# Patient Record
Sex: Female | Born: 1945 | Race: White | Hispanic: No | Smoking: Former smoker
Health system: Southern US, Community
[De-identification: ages and names within clinical notes are randomized; demographics above are authoritative.]

## PROBLEM LIST (undated history)

## (undated) DIAGNOSIS — R4189 Other symptoms and signs involving cognitive functions and awareness: Secondary | ICD-10-CM

## (undated) DIAGNOSIS — G9340 Encephalopathy, unspecified: Secondary | ICD-10-CM

## (undated) DIAGNOSIS — I10 Essential (primary) hypertension: Secondary | ICD-10-CM

## (undated) HISTORY — PX: ABDOMINAL HYSTERECTOMY: SHX81

---

## 2013-01-01 ENCOUNTER — Other Ambulatory Visit: Payer: Self-pay | Admitting: Family Medicine

## 2013-01-01 ENCOUNTER — Ambulatory Visit
Admission: RE | Admit: 2013-01-01 | Discharge: 2013-01-01 | Disposition: A | Discharge status: Home / Self Care | Payer: Medicare Other | Source: Ambulatory Visit | Attending: Family Medicine | Admitting: Family Medicine

## 2013-01-01 DIAGNOSIS — R52 Pain, unspecified: Secondary | ICD-10-CM

## 2013-01-01 DIAGNOSIS — M79609 Pain in unspecified limb: Secondary | ICD-10-CM | POA: Diagnosis not present

## 2013-01-01 DIAGNOSIS — T148XXA Other injury of unspecified body region, initial encounter: Secondary | ICD-10-CM | POA: Diagnosis not present

## 2013-01-01 DIAGNOSIS — S8990XA Unspecified injury of unspecified lower leg, initial encounter: Secondary | ICD-10-CM | POA: Diagnosis not present

## 2015-01-06 DIAGNOSIS — Z136 Encounter for screening for cardiovascular disorders: Secondary | ICD-10-CM | POA: Diagnosis not present

## 2015-01-06 DIAGNOSIS — F17200 Nicotine dependence, unspecified, uncomplicated: Secondary | ICD-10-CM | POA: Diagnosis not present

## 2015-01-06 DIAGNOSIS — F4320 Adjustment disorder, unspecified: Secondary | ICD-10-CM | POA: Diagnosis not present

## 2015-01-06 DIAGNOSIS — Z79899 Other long term (current) drug therapy: Secondary | ICD-10-CM | POA: Diagnosis not present

## 2015-01-06 DIAGNOSIS — I10 Essential (primary) hypertension: Secondary | ICD-10-CM | POA: Diagnosis not present

## 2015-08-16 DIAGNOSIS — L989 Disorder of the skin and subcutaneous tissue, unspecified: Secondary | ICD-10-CM | POA: Diagnosis not present

## 2015-08-16 DIAGNOSIS — I10 Essential (primary) hypertension: Secondary | ICD-10-CM | POA: Diagnosis not present

## 2015-08-25 DIAGNOSIS — L820 Inflamed seborrheic keratosis: Secondary | ICD-10-CM | POA: Diagnosis not present

## 2015-08-25 DIAGNOSIS — L814 Other melanin hyperpigmentation: Secondary | ICD-10-CM | POA: Diagnosis not present

## 2015-08-25 DIAGNOSIS — L570 Actinic keratosis: Secondary | ICD-10-CM | POA: Diagnosis not present

## 2015-08-25 DIAGNOSIS — L821 Other seborrheic keratosis: Secondary | ICD-10-CM | POA: Diagnosis not present

## 2015-10-06 DIAGNOSIS — L570 Actinic keratosis: Secondary | ICD-10-CM | POA: Diagnosis not present

## 2015-10-06 DIAGNOSIS — D0461 Carcinoma in situ of skin of right upper limb, including shoulder: Secondary | ICD-10-CM | POA: Diagnosis not present

## 2015-10-06 DIAGNOSIS — L820 Inflamed seborrheic keratosis: Secondary | ICD-10-CM | POA: Diagnosis not present

## 2015-10-06 DIAGNOSIS — D485 Neoplasm of uncertain behavior of skin: Secondary | ICD-10-CM | POA: Diagnosis not present

## 2015-10-26 DIAGNOSIS — S63635A Sprain of interphalangeal joint of left ring finger, initial encounter: Secondary | ICD-10-CM | POA: Diagnosis not present

## 2015-11-01 DIAGNOSIS — S6992XA Unspecified injury of left wrist, hand and finger(s), initial encounter: Secondary | ICD-10-CM | POA: Diagnosis not present

## 2015-11-01 DIAGNOSIS — S62625A Displaced fracture of medial phalanx of left ring finger, initial encounter for closed fracture: Secondary | ICD-10-CM | POA: Diagnosis not present

## 2015-11-04 DIAGNOSIS — D0461 Carcinoma in situ of skin of right upper limb, including shoulder: Secondary | ICD-10-CM | POA: Diagnosis not present

## 2015-12-01 DIAGNOSIS — S62625D Displaced fracture of medial phalanx of left ring finger, subsequent encounter for fracture with routine healing: Secondary | ICD-10-CM | POA: Diagnosis not present

## 2015-12-01 DIAGNOSIS — S62625A Displaced fracture of medial phalanx of left ring finger, initial encounter for closed fracture: Secondary | ICD-10-CM | POA: Insufficient documentation

## 2016-01-12 DIAGNOSIS — S62625D Displaced fracture of medial phalanx of left ring finger, subsequent encounter for fracture with routine healing: Secondary | ICD-10-CM | POA: Diagnosis not present

## 2016-02-07 DIAGNOSIS — M25642 Stiffness of left hand, not elsewhere classified: Secondary | ICD-10-CM | POA: Diagnosis not present

## 2016-02-28 DIAGNOSIS — S62625D Displaced fracture of medial phalanx of left ring finger, subsequent encounter for fracture with routine healing: Secondary | ICD-10-CM | POA: Diagnosis not present

## 2016-02-28 DIAGNOSIS — M25642 Stiffness of left hand, not elsewhere classified: Secondary | ICD-10-CM | POA: Diagnosis not present

## 2016-03-07 DIAGNOSIS — S62625D Displaced fracture of medial phalanx of left ring finger, subsequent encounter for fracture with routine healing: Secondary | ICD-10-CM | POA: Diagnosis not present

## 2016-03-07 DIAGNOSIS — R531 Weakness: Secondary | ICD-10-CM | POA: Diagnosis not present

## 2016-03-07 DIAGNOSIS — M25642 Stiffness of left hand, not elsewhere classified: Secondary | ICD-10-CM | POA: Diagnosis not present

## 2016-03-08 DIAGNOSIS — S62625D Displaced fracture of medial phalanx of left ring finger, subsequent encounter for fracture with routine healing: Secondary | ICD-10-CM | POA: Diagnosis not present

## 2017-07-31 DIAGNOSIS — Z23 Encounter for immunization: Secondary | ICD-10-CM | POA: Diagnosis not present

## 2017-07-31 DIAGNOSIS — F320 Major depressive disorder, single episode, mild: Secondary | ICD-10-CM | POA: Diagnosis not present

## 2017-07-31 DIAGNOSIS — I10 Essential (primary) hypertension: Secondary | ICD-10-CM | POA: Diagnosis not present

## 2017-07-31 DIAGNOSIS — F17200 Nicotine dependence, unspecified, uncomplicated: Secondary | ICD-10-CM | POA: Diagnosis not present

## 2017-08-27 ENCOUNTER — Encounter: Payer: Self-pay | Admitting: *Deleted

## 2018-05-20 ENCOUNTER — Emergency Department (HOSPITAL_COMMUNITY): Payer: Medicare Other

## 2018-05-20 ENCOUNTER — Other Ambulatory Visit: Payer: Self-pay

## 2018-05-20 ENCOUNTER — Inpatient Hospital Stay (HOSPITAL_COMMUNITY)
Admission: EM | Admit: 2018-05-20 | Discharge: 2018-06-01 | DRG: 070 | Disposition: A | Discharge status: Skilled Nursing Facility | Payer: Medicare Other | Attending: Internal Medicine | Admitting: Internal Medicine

## 2018-05-20 ENCOUNTER — Encounter (HOSPITAL_COMMUNITY): Payer: Self-pay | Admitting: Emergency Medicine

## 2018-05-20 DIAGNOSIS — E43 Unspecified severe protein-calorie malnutrition: Secondary | ICD-10-CM | POA: Diagnosis present

## 2018-05-20 DIAGNOSIS — N179 Acute kidney failure, unspecified: Secondary | ICD-10-CM | POA: Diagnosis not present

## 2018-05-20 DIAGNOSIS — R4020 Unspecified coma: Secondary | ICD-10-CM | POA: Diagnosis not present

## 2018-05-20 DIAGNOSIS — R0902 Hypoxemia: Secondary | ICD-10-CM | POA: Diagnosis not present

## 2018-05-20 DIAGNOSIS — E872 Acidosis, unspecified: Secondary | ICD-10-CM | POA: Diagnosis present

## 2018-05-20 DIAGNOSIS — R404 Transient alteration of awareness: Secondary | ICD-10-CM | POA: Diagnosis not present

## 2018-05-20 DIAGNOSIS — E860 Dehydration: Secondary | ICD-10-CM | POA: Diagnosis present

## 2018-05-20 DIAGNOSIS — G9340 Encephalopathy, unspecified: Secondary | ICD-10-CM

## 2018-05-20 DIAGNOSIS — L89151 Pressure ulcer of sacral region, stage 1: Secondary | ICD-10-CM | POA: Diagnosis present

## 2018-05-20 DIAGNOSIS — I10 Essential (primary) hypertension: Secondary | ICD-10-CM | POA: Diagnosis present

## 2018-05-20 DIAGNOSIS — R778 Other specified abnormalities of plasma proteins: Secondary | ICD-10-CM | POA: Diagnosis present

## 2018-05-20 DIAGNOSIS — Y900 Blood alcohol level of less than 20 mg/100 ml: Secondary | ICD-10-CM | POA: Diagnosis present

## 2018-05-20 DIAGNOSIS — L8990 Pressure ulcer of unspecified site, unspecified stage: Secondary | ICD-10-CM

## 2018-05-20 DIAGNOSIS — I248 Other forms of acute ischemic heart disease: Secondary | ICD-10-CM | POA: Diagnosis present

## 2018-05-20 DIAGNOSIS — Z79899 Other long term (current) drug therapy: Secondary | ICD-10-CM

## 2018-05-20 DIAGNOSIS — F1020 Alcohol dependence, uncomplicated: Secondary | ICD-10-CM | POA: Diagnosis present

## 2018-05-20 DIAGNOSIS — E876 Hypokalemia: Secondary | ICD-10-CM | POA: Diagnosis present

## 2018-05-20 DIAGNOSIS — Z87891 Personal history of nicotine dependence: Secondary | ICD-10-CM

## 2018-05-20 DIAGNOSIS — Z681 Body mass index (BMI) 19 or less, adult: Secondary | ICD-10-CM

## 2018-05-20 DIAGNOSIS — I499 Cardiac arrhythmia, unspecified: Secondary | ICD-10-CM | POA: Diagnosis not present

## 2018-05-20 DIAGNOSIS — L89322 Pressure ulcer of left buttock, stage 2: Secondary | ICD-10-CM | POA: Diagnosis present

## 2018-05-20 DIAGNOSIS — R7989 Other specified abnormal findings of blood chemistry: Secondary | ICD-10-CM | POA: Diagnosis not present

## 2018-05-20 DIAGNOSIS — D751 Secondary polycythemia: Secondary | ICD-10-CM | POA: Diagnosis present

## 2018-05-20 DIAGNOSIS — E870 Hyperosmolality and hypernatremia: Secondary | ICD-10-CM | POA: Diagnosis present

## 2018-05-20 DIAGNOSIS — G9341 Metabolic encephalopathy: Principal | ICD-10-CM | POA: Diagnosis present

## 2018-05-20 DIAGNOSIS — R569 Unspecified convulsions: Secondary | ICD-10-CM | POA: Diagnosis not present

## 2018-05-20 DIAGNOSIS — R32 Unspecified urinary incontinence: Secondary | ICD-10-CM | POA: Diagnosis present

## 2018-05-20 DIAGNOSIS — K5900 Constipation, unspecified: Secondary | ICD-10-CM

## 2018-05-20 DIAGNOSIS — R4182 Altered mental status, unspecified: Secondary | ICD-10-CM | POA: Diagnosis not present

## 2018-05-20 DIAGNOSIS — R52 Pain, unspecified: Secondary | ICD-10-CM | POA: Diagnosis not present

## 2018-05-20 DIAGNOSIS — R339 Retention of urine, unspecified: Secondary | ICD-10-CM | POA: Diagnosis not present

## 2018-05-20 HISTORY — DX: Essential (primary) hypertension: I10

## 2018-05-20 LAB — CBC WITH DIFFERENTIAL/PLATELET
Abs Immature Granulocytes: 0.05 10*3/uL (ref 0.00–0.07)
Basophils Absolute: 0 10*3/uL (ref 0.0–0.1)
Basophils Relative: 0 %
Eosinophils Absolute: 0 10*3/uL (ref 0.0–0.5)
Eosinophils Relative: 0 %
HCT: 56.2 % — ABNORMAL HIGH (ref 36.0–46.0)
Hemoglobin: 18.2 g/dL — ABNORMAL HIGH (ref 12.0–15.0)
Immature Granulocytes: 1 %
Lymphocytes Relative: 6 %
Lymphs Abs: 0.6 10*3/uL — ABNORMAL LOW (ref 0.7–4.0)
MCH: 31.9 pg (ref 26.0–34.0)
MCHC: 32.4 g/dL (ref 30.0–36.0)
MCV: 98.6 fL (ref 80.0–100.0)
Monocytes Absolute: 0.4 10*3/uL (ref 0.1–1.0)
Monocytes Relative: 4 %
Neutro Abs: 9.7 10*3/uL — ABNORMAL HIGH (ref 1.7–7.7)
Neutrophils Relative %: 89 %
Platelets: 276 10*3/uL (ref 150–400)
RBC: 5.7 MIL/uL — ABNORMAL HIGH (ref 3.87–5.11)
RDW: 12.7 % (ref 11.5–15.5)
WBC: 10.8 10*3/uL — ABNORMAL HIGH (ref 4.0–10.5)
nRBC: 0 % (ref 0.0–0.2)

## 2018-05-20 LAB — COMPREHENSIVE METABOLIC PANEL
ALT: 75 U/L — ABNORMAL HIGH (ref 0–44)
AST: 56 U/L — ABNORMAL HIGH (ref 15–41)
Albumin: 4.1 g/dL (ref 3.5–5.0)
Alkaline Phosphatase: 88 U/L (ref 38–126)
Anion gap: 17 — ABNORMAL HIGH (ref 5–15)
BUN: 58 mg/dL — ABNORMAL HIGH (ref 8–23)
CO2: 18 mmol/L — ABNORMAL LOW (ref 22–32)
Calcium: 12.2 mg/dL — ABNORMAL HIGH (ref 8.9–10.3)
Chloride: 120 mmol/L — ABNORMAL HIGH (ref 98–111)
Creatinine, Ser: 1.64 mg/dL — ABNORMAL HIGH (ref 0.44–1.00)
GFR calc Af Amer: 36 mL/min — ABNORMAL LOW (ref >60)
GFR calc non Af Amer: 31 mL/min — ABNORMAL LOW (ref >60)
Glucose, Bld: 141 mg/dL — ABNORMAL HIGH (ref 70–99)
Potassium: 3.8 mmol/L (ref 3.5–5.1)
Sodium: 155 mmol/L — ABNORMAL HIGH (ref 135–145)
Total Bilirubin: 1.1 mg/dL (ref 0.3–1.2)
Total Protein: 8.4 g/dL — ABNORMAL HIGH (ref 6.5–8.1)

## 2018-05-20 LAB — I-STAT CG4 LACTIC ACID, ED: Lactic Acid, Venous: 5.33 mmol/L (ref 0.5–1.9)

## 2018-05-20 LAB — I-STAT TROPONIN, ED: Troponin i, poc: 0.11 ng/mL (ref 0.00–0.08)

## 2018-05-20 LAB — ETHANOL: Alcohol, Ethyl (B): <10 mg/dL (ref <10)

## 2018-05-20 LAB — CBG MONITORING, ED: Glucose-Capillary: 130 mg/dL — ABNORMAL HIGH (ref 70–99)

## 2018-05-20 LAB — CK: Total CK: 53 U/L (ref 38–234)

## 2018-05-20 LAB — AMMONIA: Ammonia: 20 umol/L (ref 9–35)

## 2018-05-20 MED ORDER — SODIUM CHLORIDE 0.9 % IV BOLUS
1000.0000 mL | Freq: Once | INTRAVENOUS | Status: AC
  Administered 2018-05-20: 1000 mL via INTRAVENOUS

## 2018-05-20 MED ORDER — SODIUM CHLORIDE 0.9 % IV BOLUS
500.0000 mL | Freq: Once | INTRAVENOUS | Status: AC
  Administered 2018-05-20: 500 mL via INTRAVENOUS

## 2018-05-20 MED ORDER — METRONIDAZOLE IN NACL 5-0.79 MG/ML-% IV SOLN
500.0000 mg | Freq: Three times a day (TID) | INTRAVENOUS | Status: DC
  Administered 2018-05-20 – 2018-05-21 (×2): 500 mg via INTRAVENOUS
  Filled 2018-05-20 (×2): qty 100

## 2018-05-20 MED ORDER — SODIUM CHLORIDE 0.9 % IV SOLN
2.0000 g | Freq: Once | INTRAVENOUS | Status: AC
  Administered 2018-05-20: 2 g via INTRAVENOUS
  Filled 2018-05-20: qty 2

## 2018-05-20 MED ORDER — VANCOMYCIN HCL 500 MG IV SOLR: 500.0000 mg | INTRAVENOUS | Status: DC

## 2018-05-20 MED ORDER — SODIUM CHLORIDE 0.9 % IV SOLN
1.0000 g | Freq: Two times a day (BID) | INTRAVENOUS | Status: DC
  Filled 2018-05-20: qty 1

## 2018-05-20 MED ORDER — VANCOMYCIN HCL IN DEXTROSE 1-5 GM/200ML-% IV SOLN
1000.0000 mg | Freq: Once | INTRAVENOUS | Status: AC
  Administered 2018-05-21: 1000 mg via INTRAVENOUS
  Filled 2018-05-20: qty 200

## 2018-05-20 NOTE — Progress Notes (Signed)
Pharmacy Antibiotic Note  Sarah Gutierrez is a 73 y.o. female who presented to the [facility] on  05/20/2018 after being found down and concerned for sepsis. Pharmacy has been consulted for Vancomycin & Cefepime dosing.  Vancomycin 500 mg IV Q 48 hrs. Goal AUC 400-550. Expected AUC: 405.7 SCr used: 1.64  SCr 1.64, CrCl  Plan: - Vancomycin 1g IV x 1 dose followed by 500 mg IV every 48 hours - Cefepime 2g IV x 1 followed by 1g IV every 12 hours - Flagyl per MD - Will continue to follow renal function, culture results, LOT, and antibiotic de-escalation plans   Height: 5\' 3"  (160 cm) Weight: 90 lb (40.8 kg) IBW/kg (Calculated) : 52.4  Temp (24hrs), Avg:97.6 F (36.4 C), Min:97.6 F (36.4 C), Max:97.6 F (36.4 C)  Recent Labs  Lab 05/20/18 2045 05/20/18 2050  WBC 10.8*  --   CREATININE 1.64*  --   LATICACIDVEN  --  5.33*    Estimated Creatinine Clearance: 20 mL/min (A) (by C-G formula based on SCr of 1.64 mg/dL (H)).    No Known Allergies  Antimicrobials this admission: Vanc 1/6 >> Cefepime 1/6 >> Flagyl 1/6 >>  Dose adjustments this admission: n/a  Microbiology results: 1/6 BCx >> 1/6 UCx >>  Thank you for allowing pharmacy to be a part of this patient's care.  Alycia Rossetti, PharmD, BCPS Clinical Pharmacist Please check AMION for all [facility] numbers 05/20/2018 9:56 PM

## 2018-05-20 NOTE — ED Triage Notes (Signed)
Pt BIB [facility] from home, LKW around Christmas. Pt's brother has been trying to get in touch with her x 2 weeks. Pt found by [employer] on her couch, incontinent urine. Pt unable to communicate, will follow simple commands, able to move extremities. Right gaze preference. EMS vitals: BP 130/88, HR 118, CBG 155.

## 2018-05-20 NOTE — ED Notes (Signed)
Son jone marrow would like a call when possible at [phone]

## 2018-05-20 NOTE — H&P (Signed)
History and Physical    CHELSEI Gutierrez ION:[id number] DOB: 08-Oct-1945 DOA: 05/20/2018  Referring MD/NP/PA:   PCP: Patient, No Pcp Per   Patient coming from:  The patient is coming from home.  At baseline, pt is dependent for most of ADL before this admission    Chief Complaint: AMS  HPI: Sarah Gutierrez is a 73 y.o. female with medical history significant of hypertension, remote alcohol abuse, who presents with altered mental status.  Patient has AMS, and is unable to provide accurate medical history, therefore, most of the history is obtained by discussing the case with ED physician, per EMS report, and with the nursing staff.  Per report, patient did not answer phone calls since Christmas.  Her brother called Event organiser today for a check.  EMS found patient lying on couch, nonverbal, following only basic commands. Lisinopril and amlodipine bottle were found on scene.  There was also half a can of beer without other alcohol. When I saw pt in ED, she has AMS, nonverbal, not oriented x 3, not following commands. She moves all extremities on painful stimuli.  No active nausea vomiting, diarrhea, cough, respiratory distress noted.  Not sure if patient has any chest pain or symptoms of UTI.  She seems to have abdominal tenderness on examination.  ED Course: pt was found to have WBC 10.8, lactic acid of 5.33, negative urinalysis, troponin 0 0.011, alcohol level less than 10, ammonia 20, CK 53, AKI with BUN 58, creatinine 1.68, sodium 155, bicarbonate 18, temperature normal, slightly tachycardia, oxygen saturation 97% on room air, negative chest x-ray, CT head is negative for acute intracranial abnormalities.  CT abdomen/pelvis is not impressive.  Patient is admitted to telemetry bed as inpatient.  Review of Systems: Could not be reviewed due to altered mental status.  Travel history: No recent long distant travel.   Allergy: No Known Allergies  Past Medical History:  Diagnosis Date  .  Hypertension     History reviewed. No pertinent surgical history.  Could not be reviewed due to altered mental status.  Social History:  reports current alcohol use. No history on file for tobacco and drug. Could not be reviewed due to altered mental status.  Family History: No family history on file.  Could not be reviewed due to altered mental status.  Prior to Admission medications   Medication Sig Start Date End Date Taking? Authorizing Provider  amLODipine (NORVASC) 10 MG tablet Take 10 mg by mouth daily.   Yes [provider]  lisinopril (PRINIVIL,ZESTRIL) 30 MG tablet Take 30 mg by mouth daily.   Yes [provider]    Physical Exam: Vitals:   05/21/18 0300 05/21/18 0330 05/21/18 0430 05/21/18 0530  BP: (!) 170/80 (!) 151/89 (!) 148/83 (!) 148/63  Pulse: 79 81 69 72  Resp:  (!) 24 17 (!) 24  Temp:      TempSrc:      SpO2: 100% 97% 100% 100%  Weight:      Height:       General: Not in acute distress.  Dry mucous membrane HEENT:       Eyes: PERRL, EOMI, no scleral icterus.       ENT: No discharge from the ears and nose, no pharynx injection, no tonsillar enlargement.        Neck: No JVD, no bruit, no mass felt. Heme: No neck lymph node enlargement. Cardiac: S1/S2, RRR, No murmurs, No gallops or rubs. Respiratory: No rales, wheezing, rhonchi or  rubs. GI: Soft, nondistended, little guarding on deep palpation, no organomegaly, BS present. GU: No hematuria Ext: No pitting leg edema bilaterally. 2+DP/PT pulse bilaterally. Musculoskeletal: No joint deformities, No joint redness or warmth, no limitation of ROM in spin. Skin: No rashes.  Neuro: she has AMS, nonverbal, not oriented x 3, not following commands. Cranial nerves II-XII grossly intact, moves all extremities on painful stimuli Normal finger to nose test. Psych: Patient is not psychotic.  Labs on Admission: I have personally reviewed following labs and imaging studies  CBC: Recent Labs  Lab  05/20/18 2045  WBC 10.8*  NEUTROABS 9.7*  HGB 18.2*  HCT 56.2*  MCV 98.6  PLT 440   Basic Metabolic Panel: Recent Labs  Lab 05/20/18 2045 05/21/18 0121  NA 155* 156*  K 3.8 3.5  CL 120* 126*  CO2 18* 15*  GLUCOSE 141* 123*  BUN 58* 54*  CREATININE 1.64* 1.28*  CALCIUM 12.2* 10.8*   GFR: Estimated Creatinine Clearance: 25.6 mL/min (A) (by C-G formula based on SCr of 1.28 mg/dL (H)). Liver Function Tests: Recent Labs  Lab 05/20/18 2045  AST 56*  ALT 75*  ALKPHOS 88  BILITOT 1.1  PROT 8.4*  ALBUMIN 4.1   No results for input(s): LIPASE, AMYLASE in the last 168 hours. Recent Labs  Lab 05/20/18 2033  AMMONIA 20   Coagulation Profile: No results for input(s): INR, PROTIME in the last 168 hours. Cardiac Enzymes: Recent Labs  Lab 05/20/18 2045 05/21/18 0121  CKTOTAL 53  --   TROPONINI  --  0.11*   BNP (last 3 results) No results for input(s): PROBNP in the last 8760 hours. HbA1C: Recent Labs    05/21/18 0417  HGBA1C 5.5   CBG: Recent Labs  Lab 05/20/18 2114 05/21/18 0125  GLUCAP 130* 147*   Lipid Profile: Recent Labs    05/21/18 0418  CHOL 121  HDL 38*  LDLCALC 55  TRIG 140  CHOLHDL 3.2   Thyroid Function Tests: Recent Labs    05/21/18 0417  TSH 1.666   Anemia Panel: No results for input(s): VITAMINB12, FOLATE, FERRITIN, TIBC, IRON, RETICCTPCT in the last 72 hours. Urine analysis:    Component Value Date/Time   COLORURINE AMBER (A) 05/21/2018 0150   APPEARANCEUR CLEAR 05/21/2018 0150   LABSPEC 1.020 05/21/2018 0150   PHURINE 5.0 05/21/2018 0150   GLUCOSEU NEGATIVE 05/21/2018 0150   HGBUR NEGATIVE 05/21/2018 0150   BILIRUBINUR NEGATIVE 05/21/2018 0150   KETONESUR 5 (A) 05/21/2018 0150   PROTEINUR NEGATIVE 05/21/2018 0150   NITRITE NEGATIVE 05/21/2018 0150   LEUKOCYTESUR NEGATIVE 05/21/2018 0150   Sepsis Labs: @LABRCNTIP (procalcitonin:4,lacticidven:4) ) Recent Results (from the past 240 hour(s))  Blood culture (routine x 2)      Status: None (Preliminary result)   Collection Time: 05/20/18  8:15 PM  Result Value Ref Range Status   Specimen Description BLOOD LEFT FOREARM  Final   Special Requests   Final    BOTTLES DRAWN AEROBIC AND ANAEROBIC Blood Culture results may not be optimal due to an inadequate volume of blood received in culture bottles   Culture   Final    NO GROWTH < 12 HOURS Performed at [facility], [place] [address] [place], [place] [address]    Report Status PENDING  Incomplete  Blood culture (routine x 2)     Status: None (Preliminary result)   Collection Time: 05/20/18  8:29 PM  Result Value Ref Range Status   Specimen Description BLOOD LEFT WRIST  Final   Special Requests   Final    BOTTLES DRAWN AEROBIC ONLY Blood Culture results may not be optimal due to an inadequate volume of blood received in culture bottles   Culture   Final    NO GROWTH < 12 HOURS Performed at [facility] [address], [place], [place] [address]    Report Status PENDING  Incomplete     Radiological Exams on Admission: Ct Abdomen Pelvis Wo Contrast  Result Date: 05/21/2018 CLINICAL DATA:  Altered mental status. Sepsis. Abdominal tenderness on exam. EXAM: CT ABDOMEN AND PELVIS WITHOUT CONTRAST TECHNIQUE: Multidetector CT imaging of the abdomen and pelvis was performed following the standard protocol without IV contrast. COMPARISON:  None. FINDINGS: Lower chest: Calcified granuloma in the left lower lobe. Adjacent 3 mm noncalcified nodule. No pleural fluid. Hepatobiliary: Motion artifact and lack contrast limits detailed assessment. No evidence of focal hepatic abnormality. No calcified gallstone or pericholecystic inflammation. No evident biliary dilatation. Pancreas: Parenchymal atrophy. No ductal dilatation or inflammation. Spleen: No splenic injury or perisplenic hematoma. Adrenals/Urinary Tract: Bilateral adrenal thickening without dominant nodule. Mild thinning thinning of bilateral renal parenchyma.  No hydronephrosis. No perinephric edema. Urinary bladder is distended without wall thickening. Stomach/Bowel: Bowel evaluation is limited in the absence of enteric contrast and paucity of intra-abdominal fat the stomach is physiologically distended. 15 mm fat density lesion in the duodenum likely incidental lipoma. No obstruction. Ligament of Treitz is not clearly defined. Appendix not confidently visualized. No evidence of appendicitis. Mild submucosal fatty infiltration of the ascending colon consistent with prior inflammation. No acute colonic inflammatory change. No diverticulitis, no significant diverticular disease. Vascular/Lymphatic: Aorta bi-iliac atherosclerosis. No aneurysm. No bulky adenopathy. Reproductive: Uterus and bilateral adnexa are unremarkable. Other: No free air, free fluid, or intra-abdominal fluid collection. Musculoskeletal: There are no acute or suspicious osseous abnormalities. Degenerative change in the spine with prominent Schmorl's node superior endplate of L4. IMPRESSION: 1. No acute abnormality or explanation for abdominal tenderness. 2. 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 Aortic Atherosclerosis (ICD10-I70.0). 3. Calcified granuloma in the left lower lobe with adjacent 3 mm noncalcified nodule. Electronically Signed   By: Keith Rake M.D.   On: 05/21/2018 01:26   Dg Chest 1 View  Result Date: 05/20/2018 CLINICAL DATA:  Altered level of consciousness. EXAM: CHEST  1 VIEW COMPARISON:  None. FINDINGS: The heart size and mediastinal contours are within normal limits. Both lungs are clear. The visualized skeletal structures are unremarkable. IMPRESSION: No active disease. Electronically Signed   By: Marijo Conception, M.D.   On: 05/20/2018 20:43   Ct Head Wo Contrast  Result Date: 05/20/2018 CLINICAL DATA:  Altered mental status EXAM: CT HEAD WITHOUT CONTRAST TECHNIQUE: Contiguous axial images were obtained from the base of the skull through the vertex without  intravenous contrast. COMPARISON:  None. FINDINGS: Brain: Motion degraded study. No acute territorial infarction, hemorrhage or intracranial mass. Moderate atrophy with mild small vessel ischemic changes of the white matter. Vascular: No hyperdense vessels.  No unexpected calcification Skull: No fracture Sinuses/Orbits: No acute finding. Other: None IMPRESSION: 1. Motion degraded study which limits the exam 2. No definite CT evidence for acute intracranial abnormality. Atrophy and mild small vessel ischemic changes of the white matter Electronically Signed   By: Donavan Foil M.D.   On: 05/20/2018 21:07     EKG: Independently reviewed.  Sinus rhythm, QTC 475, LAE, early R wave progression, nonspecific T wave change.   Assessment/Plan Principal Problem:   Acute metabolic  encephalopathy Active Problems:   Hypertension   Dehydration   Hypercalcemia   Hypernatremia   Lactic acid acidosis   Elevated troponin   Acute metabolic encephalopathy: Etiology is not clear, CT head is negative for acute intracranial abnormalities.  Patient does not seem to have focal neurologic signs on physical examination.  Likely multifactorial etiology, includin severe dehydration, severe electrolyte disturbance, worsening renal function.   -Admitted to telemetry bed as inpatient -Frequent neuro check -IV hydration -Electrolytes correction -Keep patient n.p.o. until mental status improves - Hold all oral medications until mental status improves  Dehydration and hypernatremia: patient is severely dehydrated. Na 155. -IV fluid: Total of 2 L normal saline bolus were given -then D5-1/2 NS at 100 cc/h -BMP q6h -check Urine and plasma osmo  AKI: Cre 1.68 and BUN 58. This is due to dehydration -Hold lisinopril -check FeNa -Hydration as above  Hypertension: -IV hydralazine PRN  Hypercalcemia: Calcium 12.2.  Most likely due to dehydration. -Hydration as above - Check vitamin D 1, 25 level, PTH and PTH  peptide, TSH  Lactic acid acidosis: Lactic acid is up to 5.33, initially concerning for sepsis.  Patient received 1 dose of vancomycin, cefepime and Flagyl in ED.  Patient has only mild leukocytosis with WBC 10.8, no fever, clinically does not seem to have sepsis.  Urinalysis negative, chest x-ray negative.  Procalcitonin less than 0.1.  Patient received 1 dose of vancomycin cefepime and Flagyl, will discontinue antibiotics.  Most likely due to dehydration. -Follow-up of blood culture, urine culture. -Hydration as above -Trend lactic acid levels  Elevated troponin: trop 0.11.  Not sure if patient has any chest pain.  Possibly due to demand ischemia secondary to multiple acute issues. - cycle CE q6 x3 and repeat EKG in the am  - asa per rectum - Risk factor stratification: will check FLP and A1C  - 2d echo - inpt card consult was requested via Epic    Inpatient status:  # Patient requires inpatient status due to high intensity of service, high risk for further deterioration and high frequency of surveillance required.  I certify that at the point of admission it is my clinical judgment that the patient will require inpatient hospital care spanning beyond 2 midnights from the point of admission.  . This patient has hx of pertension, remote alcohol abuse . Now patient has presenting symptoms include AMS . The worrisome physical exam findings include AMS and dry mucous membrane . The initial radiographic and laboratory data are worrisome because of: Severe dehydration, severe electrolyte disturbance, acute renal injury, lactic acidosis, elevated troponin . Current medical needs: please see my assessment and plan . Predictability of an adverse outcome (risk): Patient presents with multiple acute issues, including altered mental status, severe dehydration, severe electrolyte disturbance, acute renal injury, lactic acidosis, elevated troponin.  Patient is at high risk of deteriorating.  We need to  stay in hospital for at least 2 days.     DVT ppx: SQ Heparin  Code Status: Full code Family Communication: None at bed side.  Disposition Plan:  Anticipate discharge back to previous home environment Consults called:  none Admission status:  Inpatient/tele       Date of Service 05/21/2018    Ivor Costa [facility] Pager [phone]  If 7PM-7AM, please contact night-coverage [email or web address] Password [facility] 05/21/2018, 7:42 AM

## 2018-05-20 NOTE — ED Provider Notes (Signed)
[facility] Provider Note   CSN: [id number] Arrival date & time: 05/20/18  1958     History   Chief Complaint No chief complaint on file.   HPI AADVIKA Gutierrez is a 73 y.o. female.  Patient presents to the emergency department with altered mental status.  Patient has a history of remote alcohol abuse and hypertension.  Patient reportedly did not answer phone calls since about Christmas.  Her brother called Event organiser today for a check.  EMS found patient lying on couch, nonverbal, following only basic commands.  ST segment depressions noted on 12-lead.  Level 5 caveat due to altered mental status.  Lisinopril and amlodipine found on scene.  There was also half a can of beer without other alcohol.     Past Medical History:  Diagnosis Date  . Hypertension     There are no active problems to display for this patient.   History reviewed. No pertinent surgical history.   OB History   No obstetric history on file.      Home Medications    Prior to Admission medications   Medication Sig Start Date End Date Taking? Authorizing Provider  amLODipine (NORVASC) 10 MG tablet Take 10 mg by mouth daily.   Yes [provider]  lisinopril (PRINIVIL,ZESTRIL) 30 MG tablet Take 30 mg by mouth daily.   Yes [provider]    Family History No family history on file.  Social History Social History   Tobacco Use  . Smoking status: Not on file  Substance Use Topics  . Alcohol use: Not on file  . Drug use: Not on file     Allergies   Patient has no allergy information on record.   Review of Systems Review of Systems  Unable to perform ROS: Mental status change     Physical Exam Updated Vital Signs BP (!) 171/91   Pulse 71   Temp 97.6 F (36.4 C) (Rectal)   Resp (!) 21   Ht 5\' 3"  (1.6 m)   Wt 40.8 kg   SpO2 99%   BMI 15.94 kg/m   Physical Exam Vitals signs and nursing note reviewed.    Constitutional:      Appearance: She is well-developed.     Comments: Very thin  HENT:     Head: Normocephalic and atraumatic.     Right Ear: Tympanic membrane, ear canal and external ear normal.     Left Ear: Tympanic membrane, ear canal and external ear normal.     Nose: Nose normal.     Mouth/Throat:     Mouth: Mucous membranes are dry.     Pharynx: Uvula midline.     Comments: Mucous membranes are extremely dry.  Eyes:     General: Lids are normal.     Extraocular Movements:     Right eye: No nystagmus.     Left eye: No nystagmus.     Conjunctiva/sclera: Conjunctivae normal.     Pupils: Pupils are equal, round, and reactive to light.     Comments: Pupils are perrl, slightly sluggish bilaterally, symmetric  Neck:     Musculoskeletal: Normal range of motion and neck supple.  Cardiovascular:     Rate and Rhythm: Normal rate and regular rhythm.  Pulmonary:     Effort: Pulmonary effort is normal.     Breath sounds: Normal breath sounds.  Abdominal:     Palpations: Abdomen is soft.     Tenderness: There is  no abdominal tenderness.  Musculoskeletal:     Cervical back: She exhibits normal range of motion, no tenderness and no bony tenderness.  Skin:    General: Skin is warm and dry.  Neurological:     Mental Status: She is alert.     GCS: GCS eye subscore is 4. GCS verbal subscore is 5. GCS motor subscore is 6.     Deep Tendon Reflexes: Reflexes are normal and symmetric.     Comments: Patient is nonverbal.  She does move her extremities.  She will follow basic commands such as sticking out her tongue.  Neuro exam otherwise limited.      ED Treatments / Results  Labs (all labs ordered are listed, but only abnormal results are displayed) Labs Reviewed  COMPREHENSIVE METABOLIC PANEL - Abnormal; Notable for the following components:      Result Value   Sodium 155 (*)    Chloride 120 (*)    CO2 18 (*)    Glucose, Bld 141 (*)    BUN 58 (*)    Creatinine, Ser 1.64 (*)     Calcium 12.2 (*)    Total Protein 8.4 (*)    AST 56 (*)    ALT 75 (*)    GFR calc non Af Amer 31 (*)    GFR calc Af Amer 36 (*)    Anion gap 17 (*)    All other components within normal limits  CBC WITH DIFFERENTIAL/PLATELET - Abnormal; Notable for the following components:   WBC 10.8 (*)    RBC 5.70 (*)    Hemoglobin 18.2 (*)    HCT 56.2 (*)    Neutro Abs 9.7 (*)    Lymphs Abs 0.6 (*)    All other components within normal limits  I-STAT TROPONIN, ED - Abnormal; Notable for the following components:   Troponin i, poc 0.11 (*)    All other components within normal limits  CBG MONITORING, ED - Abnormal; Notable for the following components:   Glucose-Capillary 130 (*)    All other components within normal limits  I-STAT CG4 LACTIC ACID, ED - Abnormal; Notable for the following components:   Lactic Acid, Venous 5.33 (*)    All other components within normal limits  URINE CULTURE  CULTURE, BLOOD (ROUTINE X 2)  CULTURE, BLOOD (ROUTINE X 2)  ETHANOL  CK  AMMONIA  URINALYSIS, ROUTINE W REFLEX MICROSCOPIC  I-STAT CG4 LACTIC ACID, ED    ED ECG REPORT   Date: 05/20/2018  Rate: 86  Rhythm: normal sinus rhythm  QRS Axis: normal  Intervals: normal  ST/T Wave abnormalities: normal  Conduction Disutrbances:none  Narrative Interpretation:   Old EKG Reviewed: none available  I have personally reviewed the EKG tracing and agree with the computerized printout as noted.  Radiology Dg Chest 1 View  Result Date: 05/20/2018 CLINICAL DATA:  Altered level of consciousness. EXAM: CHEST  1 VIEW COMPARISON:  None. FINDINGS: The heart size and mediastinal contours are within normal limits. Both lungs are clear. The visualized skeletal structures are unremarkable. IMPRESSION: No active disease. Electronically Signed   By: Marijo Conception, M.D.   On: 05/20/2018 20:43   Ct Head Wo Contrast  Result Date: 05/20/2018 CLINICAL DATA:  Altered mental status EXAM: CT HEAD WITHOUT CONTRAST TECHNIQUE:  Contiguous axial images were obtained from the base of the skull through the vertex without intravenous contrast. COMPARISON:  None. FINDINGS: Brain: Motion degraded study. No acute territorial infarction, hemorrhage or intracranial mass. Moderate  atrophy with mild small vessel ischemic changes of the white matter. Vascular: No hyperdense vessels.  No unexpected calcification Skull: No fracture Sinuses/Orbits: No acute finding. Other: None IMPRESSION: 1. Motion degraded study which limits the exam 2. No definite CT evidence for acute intracranial abnormality. Atrophy and mild small vessel ischemic changes of the white matter Electronically Signed   By: Donavan Foil M.D.   On: 05/20/2018 21:07    Procedures Procedures (including critical care time)  Medications Ordered in ED Medications  metroNIDAZOLE (FLAGYL) IVPB 500 mg (500 mg Intravenous New Bag/Given 05/20/18 2302)  vancomycin (VANCOCIN) IVPB 1000 mg/200 mL premix (has no administration in time range)  sodium chloride 0.9 % bolus 500 mL (500 mLs Intravenous New Bag/Given 05/20/18 2303)  vancomycin (VANCOCIN) 500 mg in sodium chloride 0.9 % 100 mL IVPB (has no administration in time range)  ceFEPIme (MAXIPIME) 1 g in sodium chloride 0.9 % 100 mL IVPB (has no administration in time range)  sodium chloride 0.9 % bolus 500 mL (0 mLs Intravenous Stopped 05/20/18 2123)  sodium chloride 0.9 % bolus 1,000 mL (0 mLs Intravenous Stopped 05/20/18 2223)  ceFEPIme (MAXIPIME) 2 g in sodium chloride 0.9 % 100 mL IVPB (0 g Intravenous Stopped 05/20/18 2251)     Initial Impression / Assessment and Plan / ED Course  I have reviewed the triage vital signs and the nursing notes.  Pertinent labs & imaging results that were available during my care of the patient were reviewed by me and considered in my medical decision making (see chart for details).  Clinical Course as of May 20 2140  Mon May 20, 2018  2058 I-Stat CG4 Lactic Acid, ED(!!) [HE]  2059 I-stat  troponin, ED [HE]    Clinical Course User Index [HE] Dorise Bullion, Student-PA    Patient seen and examined. Work-up initiated. Fluids ordered. Discussed with Dr. Vanita Panda who will see. Extensive ddx at this point.   Vital signs reviewed and are as follows: BP (!) 171/91   Pulse 71   Temp 97.6 F (36.4 C) (Rectal)   Resp (!) 21   Ht 5\' 3"  (1.6 m)   Wt 40.8 kg   SpO2 99%   BMI 15.94 kg/m   9:43 PM CT head okay. CXR clear. + hypernatremia, + lactic acid, + trop. Fluids ordered. No clear cause of symptoms. Unclear if this is infection but cultures sent, will cover with abx.  11:05 PM patient reevaluated.  She is more verbal now and alert, however confused.  11:21 PM Spoke with Dr. Blaine Hamper who will see.   CRITICAL CARE Performed by: Carlisle Cater PA-C Total critical care time: 40 minutes Critical care time was exclusive of separately billable procedures and treating other patients. Critical care was necessary to treat or prevent imminent or life-threatening deterioration. Critical care was time spent personally by me on the following activities: development of treatment plan with patient and/or surrogate as well as nursing, discussions with consultants, evaluation of patient's response to treatment, examination of patient, obtaining history from patient or surrogate, ordering and performing treatments and interventions, ordering and review of laboratory studies, ordering and review of radiographic studies, pulse oximetry and re-evaluation of patient's condition.   Final Clinical Impressions(s) / ED Diagnoses   Final diagnoses:  Encephalopathy  Dehydration  AKI (acute kidney injury) ([place])  Hypernatremia   Admit.   ED Discharge Orders    None       Carlisle Cater, Hershal Coria 05/20/18 2322    Carmin Muskrat,  MD 05/21/18 2045

## 2018-05-21 ENCOUNTER — Encounter (HOSPITAL_COMMUNITY): Payer: Self-pay | Admitting: Cardiology

## 2018-05-21 ENCOUNTER — Inpatient Hospital Stay (HOSPITAL_COMMUNITY): Payer: Medicare Other

## 2018-05-21 DIAGNOSIS — E870 Hyperosmolality and hypernatremia: Secondary | ICD-10-CM | POA: Diagnosis not present

## 2018-05-21 DIAGNOSIS — R5381 Other malaise: Secondary | ICD-10-CM | POA: Diagnosis not present

## 2018-05-21 DIAGNOSIS — R32 Unspecified urinary incontinence: Secondary | ICD-10-CM | POA: Diagnosis present

## 2018-05-21 DIAGNOSIS — R4182 Altered mental status, unspecified: Secondary | ICD-10-CM | POA: Diagnosis not present

## 2018-05-21 DIAGNOSIS — L89151 Pressure ulcer of sacral region, stage 1: Secondary | ICD-10-CM | POA: Diagnosis present

## 2018-05-21 DIAGNOSIS — R7989 Other specified abnormal findings of blood chemistry: Secondary | ICD-10-CM

## 2018-05-21 DIAGNOSIS — E43 Unspecified severe protein-calorie malnutrition: Secondary | ICD-10-CM | POA: Diagnosis present

## 2018-05-21 DIAGNOSIS — L89322 Pressure ulcer of left buttock, stage 2: Secondary | ICD-10-CM | POA: Diagnosis present

## 2018-05-21 DIAGNOSIS — E860 Dehydration: Secondary | ICD-10-CM | POA: Diagnosis not present

## 2018-05-21 DIAGNOSIS — I10 Essential (primary) hypertension: Secondary | ICD-10-CM | POA: Diagnosis not present

## 2018-05-21 DIAGNOSIS — R404 Transient alteration of awareness: Secondary | ICD-10-CM | POA: Diagnosis not present

## 2018-05-21 DIAGNOSIS — N179 Acute kidney failure, unspecified: Secondary | ICD-10-CM | POA: Diagnosis not present

## 2018-05-21 DIAGNOSIS — R339 Retention of urine, unspecified: Secondary | ICD-10-CM | POA: Diagnosis not present

## 2018-05-21 DIAGNOSIS — Z681 Body mass index (BMI) 19 or less, adult: Secondary | ICD-10-CM | POA: Diagnosis not present

## 2018-05-21 DIAGNOSIS — F1020 Alcohol dependence, uncomplicated: Secondary | ICD-10-CM | POA: Diagnosis present

## 2018-05-21 DIAGNOSIS — Z87891 Personal history of nicotine dependence: Secondary | ICD-10-CM | POA: Diagnosis not present

## 2018-05-21 DIAGNOSIS — A419 Sepsis, unspecified organism: Secondary | ICD-10-CM | POA: Diagnosis not present

## 2018-05-21 DIAGNOSIS — R109 Unspecified abdominal pain: Secondary | ICD-10-CM | POA: Diagnosis not present

## 2018-05-21 DIAGNOSIS — M2550 Pain in unspecified joint: Secondary | ICD-10-CM | POA: Diagnosis not present

## 2018-05-21 DIAGNOSIS — E876 Hypokalemia: Secondary | ICD-10-CM | POA: Diagnosis present

## 2018-05-21 DIAGNOSIS — K5900 Constipation, unspecified: Secondary | ICD-10-CM | POA: Diagnosis not present

## 2018-05-21 DIAGNOSIS — Z7401 Bed confinement status: Secondary | ICD-10-CM | POA: Diagnosis not present

## 2018-05-21 DIAGNOSIS — D751 Secondary polycythemia: Secondary | ICD-10-CM | POA: Diagnosis not present

## 2018-05-21 DIAGNOSIS — G9341 Metabolic encephalopathy: Secondary | ICD-10-CM | POA: Diagnosis not present

## 2018-05-21 DIAGNOSIS — Z79899 Other long term (current) drug therapy: Secondary | ICD-10-CM | POA: Diagnosis not present

## 2018-05-21 DIAGNOSIS — Y900 Blood alcohol level of less than 20 mg/100 ml: Secondary | ICD-10-CM | POA: Diagnosis present

## 2018-05-21 DIAGNOSIS — E872 Acidosis: Secondary | ICD-10-CM | POA: Diagnosis not present

## 2018-05-21 DIAGNOSIS — G9340 Encephalopathy, unspecified: Secondary | ICD-10-CM | POA: Diagnosis not present

## 2018-05-21 DIAGNOSIS — I248 Other forms of acute ischemic heart disease: Secondary | ICD-10-CM | POA: Diagnosis present

## 2018-05-21 LAB — BASIC METABOLIC PANEL
Anion gap: 15 (ref 5–15)
Anion gap: 15 (ref 5–15)
Anion gap: 8 (ref 5–15)
BUN: 54 mg/dL — ABNORMAL HIGH (ref 8–23)
BUN: 48 mg/dL — ABNORMAL HIGH (ref 8–23)
BUN: 33 mg/dL — ABNORMAL HIGH (ref 8–23)
CO2: 15 mmol/L — ABNORMAL LOW (ref 22–32)
CO2: 20 mmol/L — ABNORMAL LOW (ref 22–32)
CO2: 18 mmol/L — ABNORMAL LOW (ref 22–32)
Calcium: 10.8 mg/dL — ABNORMAL HIGH (ref 8.9–10.3)
Calcium: 11 mg/dL — ABNORMAL HIGH (ref 8.9–10.3)
Calcium: 10.1 mg/dL (ref 8.9–10.3)
Chloride: 126 mmol/L — ABNORMAL HIGH (ref 98–111)
Chloride: 120 mmol/L — ABNORMAL HIGH (ref 98–111)
Chloride: 121 mmol/L — ABNORMAL HIGH (ref 98–111)
Creatinine, Ser: 1.28 mg/dL — ABNORMAL HIGH (ref 0.44–1.00)
Creatinine, Ser: 1.17 mg/dL — ABNORMAL HIGH (ref 0.44–1.00)
Creatinine, Ser: 0.96 mg/dL (ref 0.44–1.00)
GFR calc Af Amer: 48 mL/min — ABNORMAL LOW (ref >60)
GFR calc Af Amer: 54 mL/min — ABNORMAL LOW (ref >60)
GFR calc Af Amer: >60 mL/min (ref >60)
GFR calc non Af Amer: 42 mL/min — ABNORMAL LOW (ref >60)
GFR calc non Af Amer: 47 mL/min — ABNORMAL LOW (ref >60)
GFR calc non Af Amer: 59 mL/min — ABNORMAL LOW (ref >60)
Glucose, Bld: 123 mg/dL — ABNORMAL HIGH (ref 70–99)
Glucose, Bld: 130 mg/dL — ABNORMAL HIGH (ref 70–99)
Glucose, Bld: 171 mg/dL — ABNORMAL HIGH (ref 70–99)
Potassium: 3.5 mmol/L (ref 3.5–5.1)
Potassium: 3.3 mmol/L — ABNORMAL LOW (ref 3.5–5.1)
Potassium: 4 mmol/L (ref 3.5–5.1)
Sodium: 156 mmol/L — ABNORMAL HIGH (ref 135–145)
Sodium: 155 mmol/L — ABNORMAL HIGH (ref 135–145)
Sodium: 147 mmol/L — ABNORMAL HIGH (ref 135–145)

## 2018-05-21 LAB — LIPID PANEL
Cholesterol: 121 mg/dL (ref 0–200)
HDL: 38 mg/dL — ABNORMAL LOW (ref >40)
LDL Cholesterol: 55 mg/dL (ref 0–99)
Total CHOL/HDL Ratio: 3.2 RATIO
Triglycerides: 140 mg/dL (ref <150)
VLDL: 28 mg/dL (ref 0–40)

## 2018-05-21 LAB — COMPREHENSIVE METABOLIC PANEL
ALT: 47 U/L — ABNORMAL HIGH (ref 0–44)
AST: 35 U/L (ref 15–41)
Albumin: 2.8 g/dL — ABNORMAL LOW (ref 3.5–5.0)
Alkaline Phosphatase: 58 U/L (ref 38–126)
Anion gap: 6 (ref 5–15)
BUN: 43 mg/dL — ABNORMAL HIGH (ref 8–23)
CO2: 19 mmol/L — ABNORMAL LOW (ref 22–32)
Calcium: 10.2 mg/dL (ref 8.9–10.3)
Chloride: 128 mmol/L — ABNORMAL HIGH (ref 98–111)
Creatinine, Ser: 1.02 mg/dL — ABNORMAL HIGH (ref 0.44–1.00)
GFR calc Af Amer: >60 mL/min (ref >60)
GFR calc non Af Amer: 55 mL/min — ABNORMAL LOW (ref >60)
Glucose, Bld: 123 mg/dL — ABNORMAL HIGH (ref 70–99)
Potassium: 3.7 mmol/L (ref 3.5–5.1)
Sodium: 153 mmol/L — ABNORMAL HIGH (ref 135–145)
Total Bilirubin: 1 mg/dL (ref 0.3–1.2)
Total Protein: 6.1 g/dL — ABNORMAL LOW (ref 6.5–8.1)

## 2018-05-21 LAB — CBC
HCT: 48.2 % — ABNORMAL HIGH (ref 36.0–46.0)
Hemoglobin: 15.4 g/dL — ABNORMAL HIGH (ref 12.0–15.0)
MCH: 31.8 pg (ref 26.0–34.0)
MCHC: 32 g/dL (ref 30.0–36.0)
MCV: 99.6 fL (ref 80.0–100.0)
Platelets: 237 10*3/uL (ref 150–400)
RBC: 4.84 MIL/uL (ref 3.87–5.11)
RDW: 12.7 % (ref 11.5–15.5)
WBC: 10.3 10*3/uL (ref 4.0–10.5)
nRBC: 0 % (ref 0.0–0.2)

## 2018-05-21 LAB — URINALYSIS, ROUTINE W REFLEX MICROSCOPIC
Bilirubin Urine: NEGATIVE
Glucose, UA: NEGATIVE mg/dL
Hgb urine dipstick: NEGATIVE
Ketones, ur: 5 mg/dL — AB
Leukocytes, UA: NEGATIVE
Nitrite: NEGATIVE
Protein, ur: NEGATIVE mg/dL
Specific Gravity, Urine: 1.02 (ref 1.005–1.030)
pH: 5 (ref 5.0–8.0)

## 2018-05-21 LAB — RAPID URINE DRUG SCREEN, HOSP PERFORMED
Amphetamines: NOT DETECTED
Barbiturates: NOT DETECTED
Benzodiazepines: NOT DETECTED
Cocaine: NOT DETECTED
Opiates: NOT DETECTED
Tetrahydrocannabinol: NOT DETECTED

## 2018-05-21 LAB — TROPONIN I
Troponin I: 0.11 ng/mL (ref <0.03)
Troponin I: 0.09 ng/mL (ref <0.03)
Troponin I: 0.07 ng/mL (ref <0.03)

## 2018-05-21 LAB — HEMOGLOBIN A1C
Hgb A1c MFr Bld: 5.5 % (ref 4.8–5.6)
Mean Plasma Glucose: 111.15 mg/dL

## 2018-05-21 LAB — LACTIC ACID, PLASMA
Lactic Acid, Venous: 2.6 mmol/L (ref 0.5–1.9)
Lactic Acid, Venous: 2.7 mmol/L (ref 0.5–1.9)
Lactic Acid, Venous: 2.7 mmol/L (ref 0.5–1.9)
Lactic Acid, Venous: 2.7 mmol/L (ref 0.5–1.9)

## 2018-05-21 LAB — TSH: TSH: 1.666 u[IU]/mL (ref 0.350–4.500)

## 2018-05-21 LAB — CK TOTAL AND CKMB (NOT AT ARMC)
CK, MB: 7.5 ng/mL — ABNORMAL HIGH (ref 0.5–5.0)
Relative Index: INVALID (ref 0.0–2.5)
Total CK: 61 U/L (ref 38–234)

## 2018-05-21 LAB — OSMOLALITY: Osmolality: 344 mOsm/kg (ref 275–295)

## 2018-05-21 LAB — PROCALCITONIN: Procalcitonin: <0.1 ng/mL

## 2018-05-21 LAB — MAGNESIUM: Magnesium: 2.2 mg/dL (ref 1.7–2.4)

## 2018-05-21 LAB — ECHOCARDIOGRAM COMPLETE
Height: 66 in
Weight: 1530.87 oz

## 2018-05-21 LAB — OSMOLALITY, URINE: Osmolality, Ur: 646 mOsm/kg (ref 300–900)

## 2018-05-21 LAB — CBG MONITORING, ED: Glucose-Capillary: 147 mg/dL — ABNORMAL HIGH (ref 70–99)

## 2018-05-21 LAB — CREATININE, URINE, RANDOM: Creatinine, Urine: 150 mg/dL

## 2018-05-21 LAB — GLUCOSE, CAPILLARY: Glucose-Capillary: 151 mg/dL — ABNORMAL HIGH (ref 70–99)

## 2018-05-21 LAB — PHOSPHORUS: Phosphorus: 2.3 mg/dL — ABNORMAL LOW (ref 2.5–4.6)

## 2018-05-21 LAB — SODIUM, URINE, RANDOM: Sodium, Ur: <10 mmol/L

## 2018-05-21 LAB — VITAMIN B12: Vitamin B-12: 1436 pg/mL — ABNORMAL HIGH (ref 180–914)

## 2018-05-21 MED ORDER — POTASSIUM CHLORIDE 10 MEQ/100ML IV SOLN
10.0000 meq | INTRAVENOUS | Status: AC
  Administered 2018-05-21 (×4): 10 meq via INTRAVENOUS
  Filled 2018-05-21 (×4): qty 100

## 2018-05-21 MED ORDER — POTASSIUM CL IN DEXTROSE 5% 20 MEQ/L IV SOLN
20.0000 meq | INTRAVENOUS | Status: DC
  Administered 2018-05-21 – 2018-05-23 (×5): 20 meq via INTRAVENOUS
  Filled 2018-05-21 (×7): qty 1000

## 2018-05-21 MED ORDER — HYDRALAZINE HCL 20 MG/ML IJ SOLN: 5.0000 mg | INTRAMUSCULAR | Status: DC | PRN

## 2018-05-21 MED ORDER — LACTATED RINGERS IV BOLUS
500.0000 mL | Freq: Once | INTRAVENOUS | Status: AC
  Administered 2018-05-21: 500 mL via INTRAVENOUS

## 2018-05-21 MED ORDER — ASPIRIN 300 MG RE SUPP
300.0000 mg | Freq: Every day | RECTAL | Status: DC
  Administered 2018-05-21 – 2018-05-22 (×2): 300 mg via RECTAL
  Filled 2018-05-21 (×3): qty 1

## 2018-05-21 MED ORDER — ONDANSETRON HCL 4 MG/2ML IJ SOLN: 4.0000 mg | Freq: Three times a day (TID) | INTRAMUSCULAR | Status: DC | PRN

## 2018-05-21 MED ORDER — DEXTROSE-NACL 5-0.45 % IV SOLN
INTRAVENOUS | Status: DC
  Administered 2018-05-21: 04:00:00 via INTRAVENOUS

## 2018-05-21 MED ORDER — POTASSIUM PHOSPHATES 15 MMOLE/5ML IV SOLN
20.0000 mmol | Freq: Once | INTRAVENOUS | Status: AC
  Administered 2018-05-21: 20 mmol via INTRAVENOUS
  Filled 2018-05-21: qty 6.67

## 2018-05-21 MED ORDER — ORAL CARE MOUTH RINSE
15.0000 mL | Freq: Two times a day (BID) | OROMUCOSAL | Status: DC
  Administered 2018-05-21 – 2018-05-31 (×12): 15 mL via OROMUCOSAL

## 2018-05-21 MED ORDER — HEPARIN SODIUM (PORCINE) 5000 UNIT/ML IJ SOLN
5000.0000 [IU] | Freq: Three times a day (TID) | INTRAMUSCULAR | Status: DC
  Administered 2018-05-21 – 2018-05-31 (×27): 5000 [IU] via SUBCUTANEOUS
  Filled 2018-05-21 (×31): qty 1

## 2018-05-21 MED ORDER — ACETAMINOPHEN 650 MG RE SUPP: 650.0000 mg | Freq: Four times a day (QID) | RECTAL | Status: DC | PRN

## 2018-05-21 NOTE — Consult Note (Addendum)
Cardiology Consultation:   Patient ID: Sarah Gutierrez MRN: [id number]; DOB: [birth year]  Admit date: 05/20/2018 Date of Consult: 05/21/2018  Primary Care Provider: Patient, No Pcp Per Primary Cardiologist: New (Dr. Stanford Breed) Primary Electrophysiologist:  None    Patient Profile:   Sarah Gutierrez is a 73 y.o. female with a hx of HTN but no prior cardiac issues, who is being seen today for the evaluation of elevated troponin, in the setting of AMS, acute metabolic encephalopathy, dehydration and AKI, at the request of Dr. Blaine Hamper, Internal Medicine.   History of Present Illness:   Sarah Gutierrez has no prior cardiac history. Cardiac risk factors include HTN. Per chart review, she was on amlodipine and lisinopril as an outpatient. No h/o DM or HLD. Labs obtained this admission show Hgb A1c at 5.5. FLP showed LDL at 55 mg/dL. TG 140.   Pt was brought in by EMS on 05/20/18 for AMS. Pt was nonverbal on arrival. Per EMS report, the patient's family called law enforcement to check on her as she was not answering phone calls for several days. Per EMS report, pt was found home alone, "lying on the couch nonverbal, following only basic commands. Lisinopril and amlodipine bottle were found on scene. There was also half a can of beer without other alcohol".   In the ED, she was nonverbal and not following commands but moved all extremities on painful stimuli. No respiratory distress. Unknown history of any recent CP or UTI symptoms given pt's inability to communicate. STAT head CT was negative for any acute intracranial abnormalties. CT of abdomen and pelvis unremarkable. UA negative for UTI. Pt was found to have WBC 10.8, lactic acid of 5.33, negative urinalysis, initial troponin 0.11, alcohol level less than 10, ammonia 20, CK 53,AKI withBUN 58, creatinine 1.68, sodium 155, bicarbonate 18, temperature normal, slightly tachycardia, oxygen saturation 97% on room air, negative chest x-ray. 2D echo pending.   Pt  has been admitted by IM for acute metabolic encephalopathy and being treated for dehydration, AKI, hypernatremia, and hypercalcemia. Na is 153 today. Ca is trending down from 12 to 10.8 today. Cardiology consulted for elevated troponin, 0.11>>0.09. Echo pending. EKGs show NSR with bi atrial enlargement but no prior EKGs for comparison. Also with prolonged QT interval at 519/584 ms.  Pt is awake but remains nonverbal. Her brother and sister-in-law are both present at the bedside. Her brother is a Sport and exercise psychologist, Therapist, music, in [place]. He confirms pt has a h/o HTN but no other cardiac problems. He also reports pt has a h/o alcoholism and tobacco abuse. There is a family h/o CAD. He, her brother, had a coronary stent placed at age 35. No other family h/o heart disease.  Her brother also notes that pt endorsed recent upper respiratory symptoms during Thanksgiving and was "too sick" to attend the family dinner. To his knowledge, she has not complained of any recent CP to any family members.   Past Medical History:  Diagnosis Date  . Hypertension     History reviewed. No pertinent surgical history.   Home Medications:  Prior to Admission medications   Medication Sig Start Date End Date Taking? Authorizing Provider  amLODipine (NORVASC) 10 MG tablet Take 10 mg by mouth daily.   Yes [provider]  lisinopril (PRINIVIL,ZESTRIL) 30 MG tablet Take 30 mg by mouth daily.   Yes [provider]    Inpatient Medications: Scheduled Meds: . aspirin  300 mg Rectal Daily  . heparin  5,000  Units Subcutaneous Q8H  . mouth rinse  15 mL Mouth Rinse BID   Continuous Infusions: . dextrose 5 % with KCl 20 mEq / L 20 mEq (05/21/18 1017)  . potassium PHOSPHATE IVPB (in mmol)     PRN Meds: acetaminophen, hydrALAZINE, ondansetron (ZOFRAN) IV  Allergies:   No Known Allergies  Social History:   Social History   Socioeconomic History  . Marital status: Married    Spouse name:  Not on file  . Number of children: Not on file  . Years of education: Not on file  . Highest education level: Not on file  Occupational History  . Not on file  Social Needs  . Financial resource strain: Not on file  . Food insecurity:    Worry: Not on file    Inability: Not on file  . Transportation needs:    Medical: Not on file    Non-medical: Not on file  Tobacco Use  . Smoking status: Not on file  Substance and Sexual Activity  . 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 Drug use: Not on file  . Sexual activity: Not on file  Lifestyle  . Physical activity:    Days per week: Not on file    Minutes per session: Not on file  . Stress: Not on file  Relationships  . Social connections:    Talks on phone: Not on file    Gets together: Not on file    Attends religious service: Not on file    Active member of club or organization: Not on file    Attends meetings of clubs or organizations: Not on file    Relationship status: Not on file  . Intimate partner violence:    Fear of current or ex partner: Not on file    Emotionally abused: Not on file    Physically abused: Not on file    Forced sexual activity: Not on file  Other Topics Concern  . Not on file  Social History Narrative  . Not on file    Family History:    Family History  Problem Relation Age of Onset  . CAD Brother 26       has coronary stent      ROS:  Please see the history of present illness.   All other ROS reviewed and negative.     Physical Exam/Data:   Vitals:   05/21/18 1130 05/21/18 1200 05/21/18 1300 05/21/18 1312  BP: (!) 155/81 (!) 145/83    Pulse: (!) 46 (!) 48    Resp: 17 18    Temp:   (!) 97.4 F (36.3 C)   TempSrc:   Oral   SpO2: 100% 100%    Weight:    43.4 kg  Height:    5\' 6"  (1.676 m)    Intake/Output Summary (Last 24 hours) at 05/21/2018 1412 Last data filed at 05/21/2018 1020 Gross per 24 hour  Intake 600 ml  Output 450 ml  Net 150 ml   Filed  Weights   05/20/18 2019 05/21/18 1312  Weight: 40.8 kg 43.4 kg   Body mass index is 15.44 kg/m.  General:  Thin, disheveled appearing WF, in no acute distress  HEENT: normal Lymph: no adenopathy Neck: no JVD Endocrine:  No thryomegaly Vascular: No carotid bruits; FA pulses 2+ bilaterally without bruits  Cardiac:  normal S1, S2; RRR; no murmur Lungs:  clear to auscultation bilaterally, no wheezing, rhonchi or rales  Abd: soft, nontender, no hepatomegaly  Ext: no edema Musculoskeletal:  No deformities, BUE and BLE strength normal and equal Skin: warm and dry  Neuro:  Awake and alert but currently nonverbal Psych:  Currently noverbal   EKG:  The EKG was personally reviewed and demonstrates:  SR w/ biatrial enlargement, prolonged QT 519/584 ms Telemetry:  Telemetry was personally reviewed and demonstrates:  NSR, occasional sinus brady but no prolonged pauses  Relevant CV Studies: None  2D Echo pending   Laboratory Data:  Chemistry Recent Labs  Lab 05/21/18 0121 05/21/18 0753 05/21/18 0857  NA 156* 155* 153*  K 3.5 3.3* 3.7  CL 126* 120* 128*  CO2 15* 20* 19*  GLUCOSE 123* 130* 123*  BUN 54* 48* 43*  CREATININE 1.28* 1.17* 1.02*  CALCIUM 10.8* 11.0* 10.2  GFRNONAA 42* 47* 55*  GFRAA 48* 54* >60  ANIONGAP 15 15 6     Recent Labs  Lab 05/20/18 2045 05/21/18 0857  PROT 8.4* 6.1*  ALBUMIN 4.1 2.8*  AST 56* 35  ALT 75* 47*  ALKPHOS 88 58  BILITOT 1.1 1.0   Hematology Recent Labs  Lab 05/20/18 2045 05/21/18 0753  WBC 10.8* 10.3  RBC 5.70* 4.84  HGB 18.2* 15.4*  HCT 56.2* 48.2*  MCV 98.6 99.6  MCH 31.9 31.8  MCHC 32.4 32.0  RDW 12.7 12.7  PLT 276 237   Cardiac Enzymes Recent Labs  Lab 05/21/18 0121 05/21/18 0753  TROPONINI 0.11* 0.09*    Recent Labs  Lab 05/20/18 2048  TROPIPOC 0.11*    BNPNo results for input(s): BNP, PROBNP in the last 168 hours.  DDimer No results for input(s): DDIMER in the last 168 hours.  Radiology/Studies:  Ct  Abdomen Pelvis Wo Contrast  Result Date: 05/21/2018 CLINICAL DATA:  Altered mental status. Sepsis. Abdominal tenderness on exam. EXAM: CT ABDOMEN AND PELVIS WITHOUT CONTRAST TECHNIQUE: Multidetector CT imaging of the abdomen and pelvis was performed following the standard protocol without IV contrast. COMPARISON:  None. FINDINGS: Lower chest: Calcified granuloma in the left lower lobe. Adjacent 3 mm noncalcified nodule. No pleural fluid. Hepatobiliary: Motion artifact and lack contrast limits detailed assessment. No evidence of focal hepatic abnormality. No calcified gallstone or pericholecystic inflammation. No evident biliary dilatation. Pancreas: Parenchymal atrophy. No ductal dilatation or inflammation. Spleen: No splenic injury or perisplenic hematoma. Adrenals/Urinary Tract: Bilateral adrenal thickening without dominant nodule. Mild thinning thinning of bilateral renal parenchyma. No hydronephrosis. No perinephric edema. Urinary bladder is distended without wall thickening. Stomach/Bowel: Bowel evaluation is limited in the absence of enteric contrast and paucity of intra-abdominal fat the stomach is physiologically distended. 15 mm fat density lesion in the duodenum likely incidental lipoma. No obstruction. Ligament of Treitz is not clearly defined. Appendix not confidently visualized. No evidence of appendicitis. Mild submucosal fatty infiltration of the ascending colon consistent with prior inflammation. No acute colonic inflammatory change. No diverticulitis, no significant diverticular disease. Vascular/Lymphatic: Aorta bi-iliac atherosclerosis. No aneurysm. No bulky adenopathy. Reproductive: Uterus and bilateral adnexa are unremarkable. Other: No free air, free fluid, or intra-abdominal fluid collection. Musculoskeletal: There are no acute or suspicious osseous abnormalities. Degenerative change in the spine with prominent Schmorl's node superior endplate of L4. IMPRESSION: 1. No acute abnormality or  explanation for abdominal tenderness. 2. 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 Aortic Atherosclerosis (ICD10-I70.0). 3. Calcified granuloma in the left lower lobe with adjacent 3 mm noncalcified nodule. Electronically Signed   By: Keith Rake M.D.   On: 05/21/2018 01:26   Dg Chest 1  View  Result Date: 05/20/2018 CLINICAL DATA:  Altered level of consciousness. EXAM: CHEST  1 VIEW COMPARISON:  None. FINDINGS: The heart size and mediastinal contours are within normal limits. Both lungs are clear. The visualized skeletal structures are unremarkable. IMPRESSION: No active disease. Electronically Signed   By: Marijo Conception, M.D.   On: 05/20/2018 20:43   Ct Head Wo Contrast  Result Date: 05/20/2018 CLINICAL DATA:  Altered mental status EXAM: CT HEAD WITHOUT CONTRAST TECHNIQUE: Contiguous axial images were obtained from the base of the skull through the vertex without intravenous contrast. COMPARISON:  None. FINDINGS: Brain: Motion degraded study. No acute territorial infarction, hemorrhage or intracranial mass. Moderate atrophy with mild small vessel ischemic changes of the white matter. Vascular: No hyperdense vessels.  No unexpected calcification Skull: No fracture Sinuses/Orbits: No acute finding. Other: None IMPRESSION: 1. Motion degraded study which limits the exam 2. No definite CT evidence for acute intracranial abnormality. Atrophy and mild small vessel ischemic changes of the white matter Electronically Signed   By: Donavan Foil M.D.   On: 05/20/2018 21:07    Assessment and Plan:   Sarah Gutierrez is a 73 y.o. female with a hx of, alcoholism, tobacco abuse, family h/o CAD and personal h/o HTN but no prior cardiac issues, who is being seen today for the evaluation of elevated troponin, in the setting of AMS, acute metabolic encephalopathy, dehydration and AKI, at the request of Dr. Blaine Hamper, Internal Medicine.   1. Elevated Troponin: mild elevation at 0.11 and trending downward. Repeat  troponin was 0.09. EKGs show NSR w/ bi atrial enlargement, but no ischemic ST abnormalities. Her QT is prolonged at 519/584 ms and QT prolonging agents should be avoided and electrolytes, including potassium and magnesium, closely monitored and kept in normal range. We are unsure of any recent h/o ischemic CP or dyspnea due to current patient state. Family members are unaware of any recent CP complaints. Her cardiac risk factors include HTN and family h/o early CAD. Agree with obtaining 2D echo to assess overall cardiac function, including LVF, assess for WMAs and valvular abnormalities. Suspect her mild troponin elevation is likely demand ischemia in the setting of metabolic derangement/ encephalopathy and dehydration. However, if her echocardiogram shows any abnormalties that may suggest underlying coronary disease, then we can consider pursuing ischemic w/u once her mental status improves.  2. Metabolic Encephalopathy: management per primary team.   3. Dehydration/ AKI: management per primary team. AKI resolved. Scr improved from 1.64>>1.02.    For questions or updates, please contact [facility] Please consult [email or web address] for contact info under     Signed, Lyda Jester, PA-C  05/21/2018 2:12 PM As above, patient seen and examined.  Briefly she is a 73 year old female with past medical history of hypertension, alcohol abuse who was admitted with altered mental status, metabolic encephalopathy, dehydration and acute renal insufficiency.  Cardiology is asked to evaluate for elevated troponin.  Patient is disoriented but denies dyspnea or chest pain.  She apparently was not answering her phone calls for several days and police were asked to check on her.  She was found lying on the couch and was not verbalizing or following commands.  Initial sodium 155 with BUN 58 and creatinine 1.64.  Hemoglobin 18.2.  Electrocardiogram shows sinus rhythm, biatrial enlargement, cannot rule out septal infarct  and nonspecific ST changes.  Troponin is 0.11 and 0.09.  1 elevated troponin-patient has not had chest pain though history is difficult.  Electrocardiogram with no  diagnostic ST changes.  Troponin minimally elevated with no clear trend.  Not consistent with acute coronary syndrome.  Initial look at bedside echocardiogram showed normal LV function with final results pending.  Would not pursue further evaluation.  2 acute kidney injury-likely from dehydration.  Agree with continued hydration.  3 hypernatremia-Per primary care.  Likely from dehydration.  Agree with hydration.  4 history of alcoholism  [facility] will sign off.   Medication Recommendations: No cardiac medications indicated. Other recommendations (labs, testing, etc): No further cardiac testing Follow up as an outpatient: No need for cardiology outpatient follow-up  Kirk Ruths, MD

## 2018-05-21 NOTE — Progress Notes (Addendum)
CRITICAL VALUE ALERT  Critical Value:  Lactic 2.7  Date & Time Notied:  05/21/18 1916  Provider Notified: Baltazar Najjar NP  Orders Received/Actions taken: 500 ml bolus of LR

## 2018-05-21 NOTE — ED Notes (Signed)
Son jone marrow would like a call when possible at [phone]

## 2018-05-21 NOTE — Progress Notes (Signed)
PROGRESS NOTE    Sarah Gutierrez  ZDG:[id number] DOB: 1946/01/11 DOA: 05/20/2018 PCP: Patient, No Pcp Per   Brief Narrative:  HPI per Dr. Ivor Costa on 02/17/2019 Sarah Gutierrez is a 73 y.o. female with medical history significant of hypertension, remote alcohol abuse, who presents with altered mental status.  Patient has AMS, and is unable to provide accurate medical history, therefore, most of the history is obtained by discussing the case with ED physician, per EMS report, and with the nursing staff.  Per report, patient did not answer phone calls since Christmas. Her brother called Event organiser today for a check. EMS found patient lying on couch, nonverbal, following only basic commands. Lisinopril and amlodipine bottle were found on scene. There was also half a can of beer without other alcohol. When I saw pt in ED, she has AMS, nonverbal, not oriented x 3, not following commands. She moves all extremities on painful stimuli.  No active nausea vomiting, diarrhea, cough, respiratory distress noted.  Not sure if patient has any chest pain or symptoms of UTI.  She seems to have abdominal tenderness on examination.  ED Course: pt was found to have WBC 10.8, lactic acid of 5.33, negative urinalysis, troponin 0 0.011, alcohol level less than 10, ammonia 20, CK 53, AKI with BUN 58, creatinine 1.68, sodium 155, bicarbonate 18, temperature normal, slightly tachycardia, oxygen saturation 97% on room air, negative chest x-ray, CT head is negative for acute intracranial abnormalities.  CT abdomen/pelvis is not impressive.  Patient is admitted to telemetry bed as inpatient.  **Patient remains altered so we will obtain a UDS and continue to correct electrolytes.  Will replete potassium and will consider an MRI if patient is not improving with electrolyte correction  Assessment & Plan:   Principal Problem:   Acute metabolic encephalopathy Active Problems:   Hypertension   Dehydration  Hypercalcemia   Hypernatremia   Lactic acid acidosis   Elevated troponin  Acute Metabolic Encephalopathy -She is admitted as an inpatient to cardiac telemetry -Etiology still unclear -Head CT was negative for any acute intracranial processes. -Patient does not have any focal deficits or neurological signs on physical examination except that her left eye pupil is slightly smaller than her right eye pupil but they are both reactive to light and accommodation -Likely has a multifactorial etiology including severe dehydration, electrolyte disturbance and worsening renal function -Continue with neurochecks -Continue IV fluid hydration; she received 2 L of normal saline boluses in the ED and then was placed on D5 half-normal saline at 100 and mils per hour however we will discontinue this and place the patient on just D5W at 100 and mL/hour -Continue n.p.o. status and hold all oral medication until mental status improves -Continue with electrolyte correction -Check UDS, TSH, RPR, B12, and thiamine level -TSH is 1.666 -Lactic acid level was 5.33 on admission however procalcitonin level was less than 0.10 -Urinalysis was not very revealing -continue to monitor and correct electrolytes and if not improving even after electrolytes are corrected we will consider neuro consultation and head MRI  Dehydration and Hypernatremia -Patient was severely dehydrated with a sodium 155 -She is given 2 L normal saline boluses in the ED and then D5 half-normal saline at a rate of 100 and mils per hour but this is been changed to D5 W with 20 mEq of KCl at 100 and mils per hour -Check BMP every 6 -Check urine and plasma osmolality; serum osmolality was 344 and urine osmolality was  646 -Continue with IV fluid hydration and continue monitor sodiums very carefully  Acute Kidney Injury -Likely in the setting of prerenal due to severe dehydration -BUN/creatinine on admission was 58/1.68 -Continue to hold  lisinopril -Check urine electrolytes that calculate FENa -Hydration as above -Check CK and CK-MB -Avoid nephrotoxic medication and contrast dyes if possible -BUN/creatinine is now improved and is now 48/1.17 -Continue monitor repeat CMP in a.m.  Hypokalemia -Patient's potassium was 3.3 -Replete with IV potassium chloride 40 mEq as well as D5W +20 mEq of KCl -Continue monitor and replete as necessary -Repeat CMP now and BMPs every 6 to follow sodium  Hypercalcemia -The setting of severe dehydration -Patient's calcium level was 12.2 -Is now trending down is now 11.0 -Admitting physician is checking vitamin D 1,25 Level, PTH and PTH related peptide and TSH was normal at 1.666 -Check ionized calcium  Lactic Acidosis -In the setting of severe dehydration with concern for sepsis -Lactic acid was 1.33 on admission -Patient received 1 dose of IV vancomycin, Flagyl, and cefepime in the ED -Patient had a mild leukocytosis with only WBC of 10 point with no fever and clinically did not have signs and symptoms of sepsis -Urinalysis was negative and chest x-ray is negative -Procalcitonin was less than 0.10 -Antibiotics have been discontinued and likely this is secondary to dehydration -Blood cultures x2 have been ordered as well as urine culture and will follow -Continue IV fluid hydration as above -Lactic acid level is trending down is now 2.7  Elevated Troponin -Patient's troponin on admission was 0.11 is trending down to 0.09 -Unlikely ACS -Is unclear if the patient had any chest pain as she is encephalopathic and unable to provide a subjective history -Likely this is secondary demand ischemia -We will cycle cardiac troponin x3 and repeat EKG -Patient was given aspirin per rectum -She had a FLP and hemoglobin A1c done for stratification and hemoglobin A1c was 5.5 and she also had a lipid panel done which showed that patient had a cholesterol level of 121, HDL 38, LDL 55, triglycerides  140, and VLDL 28 -Admitting physician requested a cardiology consultation and this is still pending -Check 2D echocardiogram  Abnormal LFTs -On admission patient had AST of 56 and ALT of 75 -Continue monitor and trend CMP -If not improving or worsening will check RUQ U/S and Acute Hepatitis Panel   Metabolic Acidosis -Patient presented with a CO2 of 18 and then CO2 dropped to 15 is now 20 -Continue IV fluid hydration as above -If necessary will place the patient on bicarbonate drip -Likely in the setting of lactic acidosis  Erythrocytosis -Patient's hemoglobin/hematocrit was 18.2 and 56.2 on admission is now 15.4/40.2 -Continue IVF hydration as above -Continue to Monitor and trend and repeat CBC in a.m.  Hypertension -Continue to hold amlodipine 10 mg p.o. daily and lisinopril 30 mg daily while patient is n.p.o. -If necessary will place on hydralazine IV 10 mg every 6 PRN for systolic blood pressure greater than 161 or diastolic blood pressure greater than 100  Alcohol abuse -Continue to monitor and placed on CIWA protocol if necessary  DVT prophylaxis: Heparin 5,000 units sq q8h Code Status: FULL CODE Family Communication: No family present at bedside Disposition Plan: Continue workup and treatment and obtain PT/OT evaluation when improved  Consultants:   Cardiology  Procedures:  ECHOCARDIOGRAM   Antimicrobials:  Anti-infectives (From admission, onward)   Start     Dose/Rate Route Frequency Ordered Stop   05/22/18 2200  vancomycin (VANCOCIN) 500  mg in sodium chloride 0.9 % 100 mL IVPB  Status:  Discontinued     500 mg 100 mL/hr over 60 Minutes Intravenous Every 48 hours 05/20/18 2201 05/21/18 0732   05/21/18 1100  ceFEPIme (MAXIPIME) 1 g in sodium chloride 0.9 % 100 mL IVPB  Status:  Discontinued     1 g 200 mL/hr over 30 Minutes Intravenous Every 12 hours 05/20/18 2201 05/21/18 0732   05/20/18 2200  ceFEPIme (MAXIPIME) 2 g in sodium chloride 0.9 % 100 mL IVPB     2  g 200 mL/hr over 30 Minutes Intravenous  Once 05/20/18 2146 05/20/18 2251   05/20/18 2200  metroNIDAZOLE (FLAGYL) IVPB 500 mg  Status:  Discontinued     500 mg 100 mL/hr over 60 Minutes Intravenous Every 8 hours 05/20/18 2146 05/21/18 0732   05/20/18 2200  vancomycin (VANCOCIN) IVPB 1000 mg/200 mL premix     1,000 mg 200 mL/hr over 60 Minutes Intravenous  Once 05/20/18 2146 05/21/18 0152     Subjective: Seen and examined at bedside and still remains encephalopathic.  Answers some questions but unable to participate fully in examination due to current condition.  She denies any complaints but subjective history is on robot arrival from the patient at this time.  No family present at bedside  Objective: Vitals:   05/21/18 0730 05/21/18 0800 05/21/18 0830 05/21/18 0900  BP: (!) 158/76 (!) 156/65 (!) 144/75 (!) 160/66  Pulse: 74 63 (!) 56 (!) 54  Resp: 15 16 17 14   Temp:      TempSrc:      SpO2: 99% 100% 97% 100%  Weight:      Height:        Intake/Output Summary (Last 24 hours) at 05/21/2018 0915 Last data filed at 05/21/2018 0151 Gross per 24 hour  Intake 500 ml  Output 450 ml  Net 50 ml   Filed Weights   05/20/18 2019  Weight: 40.8 kg   Examination: Physical Exam:  Constitutional: Thin Caucasian female who appears older than stated age who NAD and appears calm and uncomfortable Eyes: Lids and conjunctivae normal, sclerae anicteric; Pupils Reactive to light and accomodation but Left pupil slightly smaller than right.   ENMT: External Ears, Nose appear normal. Grossly normal hearing. Mucous membranes are dry and mouth appeared crusty. Would not open mouth fully for me to evaluate but ? Thrush Neck: Appears normal, supple, no cervical masses, normal ROM, no appreciable thyromegaly; no JVD Respiratory: Diminished to auscultation bilaterally, no wheezing, rales, rhonchi or crackles. Normal respiratory effort and patient is not tachypenic. No accessory muscle use.  Cardiovascular:  Bradycardic rate but normal rhythm, no murmurs / rubs / gallops. S1 and S2 auscultated. .  Abdomen: Soft, non-tender, non-distended.  Bowel sounds positive x4.  GU: Deferred. Musculoskeletal: No clubbing / cyanosis of digits/nails. No joint deformity upper and lower extremities.  Skin: No rashes, lesions, ulcers on a limited sin evaluation. No induration; Warm and dry.  Neurologic: Answers some questions but does not really follow commands well. Pupils reactive to light but Left pupil slightly smaller than Right.  Psychiatric: Impaired judgment and insight. Awake but not oriented due to current condition.   Data Reviewed: I have personally reviewed following labs and imaging studies  CBC: Recent Labs  Lab 05/20/18 2045 05/21/18 0753  WBC 10.8* 10.3  NEUTROABS 9.7*  --   HGB 18.2* 15.4*  HCT 56.2* 48.2*  MCV 98.6 99.6  PLT 276 646   Basic Metabolic Panel:  Recent Labs  Lab 05/20/18 2045 05/21/18 0121 05/21/18 0753  NA 155* 156* 155*  K 3.8 3.5 3.3*  CL 120* 126* 120*  CO2 18* 15* 20*  GLUCOSE 141* 123* 130*  BUN 58* 54* 48*  CREATININE 1.64* 1.28* 1.17*  CALCIUM 12.2* 10.8* 11.0*   GFR: Estimated Creatinine Clearance: 28 mL/min (A) (by C-G formula based on SCr of 1.17 mg/dL (H)). Liver Function Tests: Recent Labs  Lab 05/20/18 2045  AST 56*  ALT 75*  ALKPHOS 88  BILITOT 1.1  PROT 8.4*  ALBUMIN 4.1   No results for input(s): LIPASE, AMYLASE in the last 168 hours. Recent Labs  Lab 05/20/18 2033  AMMONIA 20   Coagulation Profile: No results for input(s): INR, PROTIME in the last 168 hours. Cardiac Enzymes: Recent Labs  Lab 05/20/18 2045 05/21/18 0121 05/21/18 0753  CKTOTAL 53  --   --   TROPONINI  --  0.11* 0.09*   BNP (last 3 results) No results for input(s): PROBNP in the last 8760 hours. HbA1C: Recent Labs    05/21/18 0417  HGBA1C 5.5   CBG: Recent Labs  Lab 05/20/18 2114 05/21/18 0125  GLUCAP 130* 147*   Lipid Profile: Recent Labs     05/21/18 0418  CHOL 121  HDL 38*  LDLCALC 55  TRIG 140  CHOLHDL 3.2   Thyroid Function Tests: Recent Labs    05/21/18 0417  TSH 1.666   Anemia Panel: No results for input(s): VITAMINB12, FOLATE, FERRITIN, TIBC, IRON, RETICCTPCT in the last 72 hours. Sepsis Labs: Recent Labs  Lab 05/20/18 2050 05/21/18 0417 05/21/18 0753  PROCALCITON  --  <0.10  --   LATICACIDVEN 5.33* 2.6* 2.7*    Recent Results (from the past 240 hour(s))  Blood culture (routine x 2)     Status: None (Preliminary result)   Collection Time: 05/20/18  8:15 PM  Result Value Ref Range Status   Specimen Description BLOOD LEFT FOREARM  Final   Special Requests   Final    BOTTLES DRAWN AEROBIC AND ANAEROBIC Blood Culture results may not be optimal due to an inadequate volume of blood received in culture bottles   Culture   Final    NO GROWTH < 12 HOURS Performed at [facility] [address], [place], [place] [address]    Report Status PENDING  Incomplete  Blood culture (routine x 2)     Status: None (Preliminary result)   Collection Time: 05/20/18  8:29 PM  Result Value Ref Range Status   Specimen Description BLOOD LEFT WRIST  Final   Special Requests   Final    BOTTLES DRAWN AEROBIC ONLY Blood Culture results may not be optimal due to an inadequate volume of blood received in culture bottles   Culture   Final    NO GROWTH < 12 HOURS Performed at [facility], [place] [address], [place], [place] [address]    Report Status PENDING  Incomplete    Radiology Studies: Ct Abdomen Pelvis Wo Contrast  Result Date: 05/21/2018 CLINICAL DATA:  Altered mental status. Sepsis. Abdominal tenderness on exam. EXAM: CT ABDOMEN AND PELVIS WITHOUT CONTRAST TECHNIQUE: Multidetector CT imaging of the abdomen and pelvis was performed following the standard protocol without IV contrast. COMPARISON:  None. FINDINGS: Lower chest: Calcified granuloma in the left lower lobe. Adjacent 3 mm noncalcified nodule. No pleural  fluid. Hepatobiliary: Motion artifact and lack contrast limits detailed assessment. No evidence of focal hepatic abnormality. No calcified gallstone or  pericholecystic inflammation. No evident biliary dilatation. Pancreas: Parenchymal atrophy. No ductal dilatation or inflammation. Spleen: No splenic injury or perisplenic hematoma. Adrenals/Urinary Tract: Bilateral adrenal thickening without dominant nodule. Mild thinning thinning of bilateral renal parenchyma. No hydronephrosis. No perinephric edema. Urinary bladder is distended without wall thickening. Stomach/Bowel: Bowel evaluation is limited in the absence of enteric contrast and paucity of intra-abdominal fat the stomach is physiologically distended. 15 mm fat density lesion in the duodenum likely incidental lipoma. No obstruction. Ligament of Treitz is not clearly defined. Appendix not confidently visualized. No evidence of appendicitis. Mild submucosal fatty infiltration of the ascending colon consistent with prior inflammation. No acute colonic inflammatory change. No diverticulitis, no significant diverticular disease. Vascular/Lymphatic: Aorta bi-iliac atherosclerosis. No aneurysm. No bulky adenopathy. Reproductive: Uterus and bilateral adnexa are unremarkable. Other: No free air, free fluid, or intra-abdominal fluid collection. Musculoskeletal: There are no acute or suspicious osseous abnormalities. Degenerative change in the spine with prominent Schmorl's node superior endplate of L4. IMPRESSION: 1. No acute abnormality or explanation for abdominal tenderness. 2. 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 Aortic Atherosclerosis (ICD10-I70.0). 3. Calcified granuloma in the left lower lobe with adjacent 3 mm noncalcified nodule. Electronically Signed   By: Keith Rake M.D.   On: 05/21/2018 01:26   Dg Chest 1 View  Result Date: 05/20/2018 CLINICAL DATA:  Altered level of consciousness. EXAM: CHEST  1 VIEW COMPARISON:  None. FINDINGS: The heart  size and mediastinal contours are within normal limits. Both lungs are clear. The visualized skeletal structures are unremarkable. IMPRESSION: No active disease. Electronically Signed   By: Marijo Conception, M.D.   On: 05/20/2018 20:43   Ct Head Wo Contrast  Result Date: 05/20/2018 CLINICAL DATA:  Altered mental status EXAM: CT HEAD WITHOUT CONTRAST TECHNIQUE: Contiguous axial images were obtained from the base of the skull through the vertex without intravenous contrast. COMPARISON:  None. FINDINGS: Brain: Motion degraded study. No acute territorial infarction, hemorrhage or intracranial mass. Moderate atrophy with mild small vessel ischemic changes of the white matter. Vascular: No hyperdense vessels.  No unexpected calcification Skull: No fracture Sinuses/Orbits: No acute finding. Other: None IMPRESSION: 1. Motion degraded study which limits the exam 2. No definite CT evidence for acute intracranial abnormality. Atrophy and mild small vessel ischemic changes of the white matter Electronically Signed   By: Donavan Foil M.D.   On: 05/20/2018 21:07   Scheduled Meds: . aspirin  300 mg Rectal Daily  . heparin  5,000 Units Subcutaneous Q8H   Continuous Infusions: . dextrose 5 % and 0.45% NaCl Stopped (05/21/18 0616)  . potassium chloride      LOS: 0 days   Kerney Elbe, DO [facility] PAGER is on [place]  If 7PM-7AM, please contact night-coverage [email or web address] Password [email or web address] 05/21/2018, 9:15 AM

## 2018-05-21 NOTE — ED Notes (Signed)
Pt able to verify her brother is Jenny Reichmann and gave permission for information to be given. Updated John on plan of care.

## 2018-05-21 NOTE — Progress Notes (Signed)
  Echocardiogram 2D Echocardiogram has been performed.  Sarah Gutierrez 05/21/2018, 3:03 PM

## 2018-05-22 DIAGNOSIS — G9340 Encephalopathy, unspecified: Secondary | ICD-10-CM

## 2018-05-22 LAB — CBC WITH DIFFERENTIAL/PLATELET
Abs Immature Granulocytes: 0.07 10*3/uL (ref 0.00–0.07)
Basophils Absolute: 0 10*3/uL (ref 0.0–0.1)
Basophils Relative: 0 %
Eosinophils Absolute: 0 10*3/uL (ref 0.0–0.5)
Eosinophils Relative: 0 %
HCT: 35 % — ABNORMAL LOW (ref 36.0–46.0)
Hemoglobin: 12.1 g/dL (ref 12.0–15.0)
Immature Granulocytes: 1 %
Lymphocytes Relative: 9 %
Lymphs Abs: 0.6 10*3/uL — ABNORMAL LOW (ref 0.7–4.0)
MCH: 33 pg (ref 26.0–34.0)
MCHC: 34.6 g/dL (ref 30.0–36.0)
MCV: 95.4 fL (ref 80.0–100.0)
Monocytes Absolute: 0.4 10*3/uL (ref 0.1–1.0)
Monocytes Relative: 6 %
Neutro Abs: 5.6 10*3/uL (ref 1.7–7.7)
Neutrophils Relative %: 84 %
Platelets: 181 10*3/uL (ref 150–400)
RBC: 3.67 MIL/uL — ABNORMAL LOW (ref 3.87–5.11)
RDW: 12.5 % (ref 11.5–15.5)
WBC: 6.6 10*3/uL (ref 4.0–10.5)
nRBC: 0 % (ref 0.0–0.2)

## 2018-05-22 LAB — LACTIC ACID, PLASMA
Lactic Acid, Venous: 3.6 mmol/L (ref 0.5–1.9)
Lactic Acid, Venous: 3.5 mmol/L (ref 0.5–1.9)
Lactic Acid, Venous: 3.6 mmol/L (ref 0.5–1.9)
Lactic Acid, Venous: 3.4 mmol/L (ref 0.5–1.9)

## 2018-05-22 LAB — CALCITRIOL (1,25 DI-OH VIT D): Vit D, 1,25-Dihydroxy: 13.2 pg/mL — ABNORMAL LOW (ref 19.9–79.3)

## 2018-05-22 LAB — RPR: RPR Ser Ql: NONREACTIVE

## 2018-05-22 LAB — COMPREHENSIVE METABOLIC PANEL
ALT: 38 U/L (ref 0–44)
AST: 31 U/L (ref 15–41)
Albumin: 2.3 g/dL — ABNORMAL LOW (ref 3.5–5.0)
Alkaline Phosphatase: 53 U/L (ref 38–126)
Anion gap: 7 (ref 5–15)
BUN: 17 mg/dL (ref 8–23)
CO2: 19 mmol/L — ABNORMAL LOW (ref 22–32)
Calcium: 9.1 mg/dL (ref 8.9–10.3)
Chloride: 113 mmol/L — ABNORMAL HIGH (ref 98–111)
Creatinine, Ser: 0.77 mg/dL (ref 0.44–1.00)
GFR calc Af Amer: >60 mL/min (ref >60)
GFR calc non Af Amer: >60 mL/min (ref >60)
Glucose, Bld: 133 mg/dL — ABNORMAL HIGH (ref 70–99)
Potassium: 3.5 mmol/L (ref 3.5–5.1)
Sodium: 139 mmol/L (ref 135–145)
Total Bilirubin: 0.7 mg/dL (ref 0.3–1.2)
Total Protein: 5.1 g/dL — ABNORMAL LOW (ref 6.5–8.1)

## 2018-05-22 LAB — URINE CULTURE: Culture: NO GROWTH

## 2018-05-22 LAB — GLUCOSE, CAPILLARY: Glucose-Capillary: 109 mg/dL — ABNORMAL HIGH (ref 70–99)

## 2018-05-22 LAB — CALCIUM, URINE, RANDOM: Calcium, Ur: 35.9 mg/dL

## 2018-05-22 LAB — CALCIUM, IONIZED: Calcium, Ionized, Serum: 5.8 mg/dL — ABNORMAL HIGH (ref 4.5–5.6)

## 2018-05-22 LAB — PHOSPHORUS: Phosphorus: 1.8 mg/dL — ABNORMAL LOW (ref 2.5–4.6)

## 2018-05-22 LAB — PARATHYROID HORMONE, INTACT (NO CA): PTH: 77 pg/mL — ABNORMAL HIGH (ref 15–65)

## 2018-05-22 LAB — HIV ANTIBODY (ROUTINE TESTING W REFLEX): HIV Screen 4th Generation wRfx: NONREACTIVE

## 2018-05-22 LAB — MAGNESIUM: Magnesium: 1.6 mg/dL — ABNORMAL LOW (ref 1.7–2.4)

## 2018-05-22 LAB — TROPONIN I: Troponin I: 0.04 ng/mL (ref <0.03)

## 2018-05-22 MED ORDER — POTASSIUM PHOSPHATES 15 MMOLE/5ML IV SOLN
20.0000 mmol | Freq: Once | INTRAVENOUS | Status: AC
  Administered 2018-05-22: 20 mmol via INTRAVENOUS
  Filled 2018-05-22: qty 6.67

## 2018-05-22 MED ORDER — MAGNESIUM SULFATE 2 GM/50ML IV SOLN
2.0000 g | Freq: Once | INTRAVENOUS | Status: AC
  Administered 2018-05-22: 2 g via INTRAVENOUS
  Filled 2018-05-22: qty 50

## 2018-05-22 MED ORDER — SODIUM CHLORIDE 0.9 % IV BOLUS
500.0000 mL | Freq: Once | INTRAVENOUS | Status: AC
  Administered 2018-05-22: 500 mL via INTRAVENOUS

## 2018-05-22 MED ORDER — THIAMINE HCL 100 MG/ML IJ SOLN
100.0000 mg | Freq: Every day | INTRAMUSCULAR | Status: DC
  Administered 2018-05-22 – 2018-05-24 (×3): 100 mg via INTRAVENOUS
  Filled 2018-05-22 (×3): qty 2

## 2018-05-22 MED ORDER — LACTATED RINGERS IV BOLUS
500.0000 mL | Freq: Once | INTRAVENOUS | Status: AC
  Administered 2018-05-22: 500 mL via INTRAVENOUS

## 2018-05-22 MED ORDER — FOLIC ACID 5 MG/ML IJ SOLN
1.0000 mg | Freq: Every day | INTRAMUSCULAR | Status: DC
  Administered 2018-05-22 – 2018-05-24 (×3): 1 mg via INTRAVENOUS
  Filled 2018-05-22 (×3): qty 0.2

## 2018-05-22 NOTE — Care Management Note (Signed)
Case Management Note Manya Silvas, RN MSN CCM Transitions of Care [address] [place] [phone]  Patient Details  Name: Sarah Gutierrez MRN: [id number] Date of Birth: 1945/11/08  Subjective/Objective:         Acute encephalopathy           Action/Plan: PTA home alone. Spouse died about 10 years ago. Neighbors describe as a recluse. Neighbors assist with groceries. Brother, Demaris Callander is a MD in [place], [place]. He was the one who called for a welfare check. EMS found open beer cans around patient who was unresponsive on the couch-pt had been a recovering alcoholic. No PCP identified. Medicine bottles with pills found near patient. Patient appears to be FTT, malnourished-CT negative. [facility] referral made. Will continue to follow for transition of care needs.   Expected Discharge Date:                  Expected Discharge Plan:  Skilled Nursing Facility  In-House Referral:  Clinical Social Work, [facility]  Discharge planning Services  CM Consult  Post Acute Care Choice:    Choice offered to:     DME Arranged:    DME Agency:     HH Arranged:    [place] Agency:     Status of Service:  In process, will continue to follow  If discussed at Long Length of Stay Meetings, dates discussed:    Additional Comments:  Bartholomew Crews, RN 05/22/2018, 11:31 AM

## 2018-05-22 NOTE — Progress Notes (Addendum)
CRITICAL VALUE ALERT  Critical Value:  Lactic 3.6  Date & Time Notied:  05/22/18 0001  Provider Notified: Baltazar Najjar NP  Orders Received/Actions taken: No new orders received

## 2018-05-22 NOTE — Progress Notes (Signed)
Text page to DR Theodoro Grist to make aware of Lactic acid from 0700 was 3.5 - awaiting orders

## 2018-05-22 NOTE — Consult Note (Signed)
    [facility] Inpatient Consult   05/22/2018  Sarah Gutierrez Jun 15, 1945 [id number]   Thank you for this consult.  Patient evaluated for [place] [facility] services.  Patient is not currently does not have a primary care provider in the [facility].   Spoke with inpatient [facility] Wendi regarding the patient's primary care provider.  She states the patient may be for skilled nursing care post hospital   Will follow up as the patient's records are showing no recent care from a [facility] provider. Went by to patient's room, no family at bedside. There is currently no disposition noted.  Will keep patient on list but no MD is found. Updated inpatient RNCM Wendi on findings.  For questions or updates, please contact:  Natividad Brood, RN BSN [facility] Liaison  [phone] business mobile phone Toll free office [phone]

## 2018-05-22 NOTE — Progress Notes (Signed)
Temp 95.8 PO - started on Bair Hugger warming blanket will continue to monitor temp

## 2018-05-22 NOTE — Progress Notes (Signed)
PROGRESS NOTE  Sarah Gutierrez AUQ:[id number] DOB: February 04, 1946 DOA: 05/20/2018 PCP: Patient, No Pcp Per  HPI/Brief Narrative  Sarah Gutierrez is a 73 y.o. year old female with medical history significant for alcoholism, HTN who presented on 05/20/2018 after being found down at home and was found to have hypothermia, and altered mental status.  Provided history by her brother Dr. Orland Mustard Prior to this hospitalization patient was treated for URI around Thanksgiving time and had improved since then.  At her baseline she lives alone.  She does drink quite a bit with no history of alcoholism.  Since this weekend patient has not been answering phone calls by family.  Her friend tried to come over during the same time post the door found to be locked if patient did not answer (cars and driveway).  The cops were called to evaluate and patient was found down couch, incontinent of urine unable to communicate.  Her brother reports at baseline she is able to converse and is alert and oriented x4, she has a Scientist, water quality.    In the ED, she was afebrile with normal respiratory rate and oxygen saturation.  Heart rate fluctuated between 48-81.  Blood pressure peaked at 170/80.  Lab work notable for RPR negative, HIV nonreactive  Glucose 109, CK unremarkable. CMP notable for sodium of 153, creatinine of 1.02, CO2 of 19, BUN of 43.,  Calcium 12 Initial troponin was 0.11 Lactic acid found to be 5.33 UA and urine culture was unremarkable UDS, ethanol, ammonia was unremarkable Blood cultures x2 were obtained CT abdomen and head showed no acute pathology Chest x-ray showed no active disease  She was empirically started on IV vancomycin, IV Flagyl and IV cefepime, D5 half-normal saline, IV potassium in the ED.   Subjective Spoke with son (Dr. Orland Mustard)  Assessment/Plan:  #Acute encephalopathy, improving. Able to answer yes and no appropriately but not oriented.  Likely metabolic from dehydration/hypernatremia.  Investigating infection with blood cultures and urine which are unremarkable to date.  She did receive empiric Vanco/cefepime/Flagyl in the ED, have not continued care given no fever no white count and no localizing signs or symptoms of infection and procalcitonin less than 0.1. other work-up has been unremarkable with CT head/CT abdomen/chest x-ray within normal limits.  No other obvious signs for metabolic etiologies with normal ammonia, ethanol, UDS  #AKI, resolved Prerenal etiology in setting of dehydration.  Holding home lisinopril continue IV fluids, monitor BMP  #Lactic acidosis, improved from prior but still quite high Peak of 5.33 with downtrend to 2.6, now 3.5.  Unclear etiology he received empiric antibiotics in the ED but clinically does not seem infected no concern for sepsis currently.  Blood pressure remains normal.  CT abdomen shows no acute abdomen pathology.  Normal BP but will continue aggressive IV fluid resuscitation and trend lactic acid until resolution  #Hypothermia.  Unclear etiology doubt infectious, thin frame contributing? bear hugger in place. Continue step down monitoring  #Acute hypernatremia, improving In the setting of dehydration as patient was down for some unknown period of time.  Peak sodium of 155 now 147.  Continue BMP.  Continue IV fluids.  #Acute hypercalcemia, improving Elevated to 12 on admission, has improved significantly with IV fluids.  Likely elevated in the setting of dehydration as patient was down for unknown period of time.  #Mildly elevated troponin Peak troponin 0 0.11 down trended to 0.9 cardiology evaluated does not not suspect cardiac etiology given chest pain-free (still limited history), nonischemic  EKG, TTE with preserved EF and no wall motion abnormalities  History of alcoholism Monitor on CIWA for withdrawal.  Start IV supplementation with thiamine and folic given poor mentation for oral repletion.  Hypertension, currently at  goal SBP ranging 129-137, DBP in the 50s.  Will hold on home amlodipine and lisinopril especially in the setting of lactic acidosis with no other clear etiologies.     Cultures:  1/7 blood cultures negative growth to date  Telemetry: Yes  DVT prophylaxis: Heparin  consultants:  Cardiology    Procedures:  None  Antimicrobials: 05/21/2018 IV vancomycin, IV cefepime, IV Flagyl  Code Status: Full code  Family Communication: Brother updated at bedside  Disposition Plan: Continue to monitor blood pressure, serial lactic acid, calcium/sodium/mental status.        Objective: Vitals:   05/22/18 0400 05/22/18 0508 05/22/18 0600 05/22/18 0808  BP: (!) 142/79     Pulse: (!) 56     Resp: 17     Temp:  (!) 97 F (36.1 C) (!) 97.5 F (36.4 C) (!) 95.8 F (35.4 C)  TempSrc:  Rectal Oral Axillary  SpO2: 100%     Weight:      Height:        Intake/Output Summary (Last 24 hours) at 05/22/2018 0842 Last data filed at 05/22/2018 6384 Gross per 24 hour  Intake 2566.81 ml  Output 1725 ml  Net 841.81 ml   Filed Weights   05/20/18 2019 05/21/18 1312  Weight: 40.8 kg 43.4 kg    Exam:  Constitutional:thin, elderly female Eyes: EOMI, anicteric, normal conjunctivae ENMT: yellow thrush on tongue, dry oral mucosa Neck: FROM, no thyromegaly Cardiovascular: RRR no MRGs, with no peripheral edema Respiratory: Normal respiratory effort on room air Abdomen: Soft,non-tender,  Skin: No rash ulcers, or lesions. Without skin tenting  Neurologic: able to answer yes and no, not very alert, somonolent but easily arousable to voice.   Data Reviewed: CBC: Recent Labs  Lab 05/20/18 2045 05/21/18 0753 05/22/18 0653  WBC 10.8* 10.3 6.4  NEUTROABS 9.7*  --  5.4  HGB 18.2* 15.4* 12.0  HCT 56.2* 48.2* 36.8  MCV 98.6 99.6 97.9  PLT 276 237 665   Basic Metabolic Panel: Recent Labs  Lab 05/20/18 2045 05/21/18 0121 05/21/18 0753 05/21/18 0857 05/21/18 1814  NA 155* 156* 155* 153*  147*  K 3.8 3.5 3.3* 3.7 4.0  CL 120* 126* 120* 128* 121*  CO2 18* 15* 20* 19* 18*  GLUCOSE 141* 123* 130* 123* 171*  BUN 58* 54* 48* 43* 33*  CREATININE 1.64* 1.28* 1.17* 1.02* 0.96  CALCIUM 12.2* 10.8* 11.0* 10.2 10.1  MG  --   --   --  2.2  --   PHOS  --   --   --  2.3*  --    GFR: Estimated Creatinine Clearance: 36.3 mL/min (by C-G formula based on SCr of 0.96 mg/dL). Liver Function Tests: Recent Labs  Lab 05/20/18 2045 05/21/18 0857  AST 56* 35  ALT 75* 47*  ALKPHOS 88 58  BILITOT 1.1 1.0  PROT 8.4* 6.1*  ALBUMIN 4.1 2.8*   No results for input(s): LIPASE, AMYLASE in the last 168 hours. Recent Labs  Lab 05/20/18 2033  AMMONIA 20   Coagulation Profile: No results for input(s): INR, PROTIME in the last 168 hours. Cardiac Enzymes: Recent Labs  Lab 05/20/18 2045 05/21/18 0121 05/21/18 0753 05/21/18 0857 05/21/18 1814 05/21/18 2317  CKTOTAL 53  --   --  61  --   --  CKMB  --   --   --  7.5*  --   --   TROPONINI  --  0.11* 0.09*  --  0.07* 0.04*   BNP (last 3 results) No results for input(s): PROBNP in the last 8760 hours. HbA1C: Recent Labs    05/21/18 0417  HGBA1C 5.5   CBG: Recent Labs  Lab 05/20/18 2114 05/21/18 0125 05/21/18 1709  GLUCAP 130* 147* 151*   Lipid Profile: Recent Labs    05/21/18 0418  CHOL 121  HDL 38*  LDLCALC 55  TRIG 140  CHOLHDL 3.2   Thyroid Function Tests: Recent Labs    05/21/18 0417  TSH 1.666   Anemia Panel: Recent Labs    05/21/18 0857  VITAMINB12 1,436*   Urine analysis:    Component Value Date/Time   COLORURINE AMBER (A) 05/21/2018 0150   APPEARANCEUR CLEAR 05/21/2018 0150   LABSPEC 1.020 05/21/2018 0150   PHURINE 5.0 05/21/2018 0150   GLUCOSEU NEGATIVE 05/21/2018 0150   HGBUR NEGATIVE 05/21/2018 0150   BILIRUBINUR NEGATIVE 05/21/2018 0150   KETONESUR 5 (A) 05/21/2018 0150   PROTEINUR NEGATIVE 05/21/2018 0150   NITRITE NEGATIVE 05/21/2018 0150   LEUKOCYTESUR NEGATIVE 05/21/2018 0150    Sepsis Labs: @LABRCNTIP (procalcitonin:4,lacticidven:4)  ) Recent Results (from the past 240 hour(s))  Blood culture (routine x 2)     Status: None (Preliminary result)   Collection Time: 05/20/18  8:15 PM  Result Value Ref Range Status   Specimen Description BLOOD LEFT FOREARM  Final   Special Requests   Final    BOTTLES DRAWN AEROBIC AND ANAEROBIC Blood Culture results may not be optimal due to an inadequate volume of blood received in culture bottles   Culture   Final    NO GROWTH 2 DAYS Performed at [facility], [place] [address], [place], [place] [address]    Report Status PENDING  Incomplete  Blood culture (routine x 2)     Status: None (Preliminary result)   Collection Time: 05/20/18  8:29 PM  Result Value Ref Range Status   Specimen Description BLOOD LEFT WRIST  Final   Special Requests   Final    BOTTLES DRAWN AEROBIC ONLY Blood Culture results may not be optimal due to an inadequate volume of blood received in culture bottles   Culture   Final    NO GROWTH 2 DAYS Performed at [facility], [place] [address], [place], [place] [address]    Report Status PENDING  Incomplete  Urine Culture     Status: None   Collection Time: 05/21/18  1:50 AM  Result Value Ref Range Status   Specimen Description URINE, CATHETERIZED  Final   Special Requests NONE  Final   Culture   Final    NO GROWTH Performed at [facility], [place] [address], [place], [place] [address]    Report Status 05/22/2018 FINAL  Final      Studies: No results found.  Scheduled Meds: . aspirin  300 mg Rectal Daily  . heparin  5,000 Units Subcutaneous Q8H  . mouth rinse  15 mL Mouth Rinse BID    Continuous Infusions: . dextrose 5 % with KCl 20 mEq / L 20 mEq (05/22/18 0817)     LOS: 1 day     Desiree Hane, MD [facility] Pager [phone]  If 7PM-7AM, please contact night-coverage [email or web address] Password [facility] 05/22/2018, 8:42 AM

## 2018-05-23 LAB — COMPREHENSIVE METABOLIC PANEL
ALT: 37 U/L (ref 0–44)
AST: 28 U/L (ref 15–41)
Albumin: 2.5 g/dL — ABNORMAL LOW (ref 3.5–5.0)
Alkaline Phosphatase: 56 U/L (ref 38–126)
Anion gap: 8 (ref 5–15)
BUN: 9 mg/dL (ref 8–23)
CO2: 18 mmol/L — ABNORMAL LOW (ref 22–32)
Calcium: 8.7 mg/dL — ABNORMAL LOW (ref 8.9–10.3)
Chloride: 110 mmol/L (ref 98–111)
Creatinine, Ser: 0.75 mg/dL (ref 0.44–1.00)
GFR calc Af Amer: >60 mL/min (ref >60)
GFR calc non Af Amer: >60 mL/min (ref >60)
Glucose, Bld: 144 mg/dL — ABNORMAL HIGH (ref 70–99)
Potassium: 3.9 mmol/L (ref 3.5–5.1)
Sodium: 136 mmol/L (ref 135–145)
Total Bilirubin: 0.8 mg/dL (ref 0.3–1.2)
Total Protein: 5.4 g/dL — ABNORMAL LOW (ref 6.5–8.1)

## 2018-05-23 LAB — CBC WITH DIFFERENTIAL/PLATELET
Abs Immature Granulocytes: 0.04 10*3/uL (ref 0.00–0.07)
Basophils Absolute: 0 10*3/uL (ref 0.0–0.1)
Basophils Relative: 0 %
Eosinophils Absolute: 0 10*3/uL (ref 0.0–0.5)
Eosinophils Relative: 1 %
HCT: 36.8 % (ref 36.0–46.0)
Hemoglobin: 12 g/dL (ref 12.0–15.0)
Immature Granulocytes: 1 %
Lymphocytes Relative: 7 %
Lymphs Abs: 0.5 10*3/uL — ABNORMAL LOW (ref 0.7–4.0)
MCH: 31.9 pg (ref 26.0–34.0)
MCHC: 32.6 g/dL (ref 30.0–36.0)
MCV: 97.9 fL (ref 80.0–100.0)
Monocytes Absolute: 0.4 10*3/uL (ref 0.1–1.0)
Monocytes Relative: 6 %
Neutro Abs: 5.4 10*3/uL (ref 1.7–7.7)
Neutrophils Relative %: 85 %
Platelets: 160 10*3/uL (ref 150–400)
RBC: 3.76 MIL/uL — ABNORMAL LOW (ref 3.87–5.11)
RDW: 12.5 % (ref 11.5–15.5)
WBC: 6.4 10*3/uL (ref 4.0–10.5)
nRBC: 0 % (ref 0.0–0.2)

## 2018-05-23 LAB — CBC
HCT: 35 % — ABNORMAL LOW (ref 36.0–46.0)
Hemoglobin: 12.1 g/dL (ref 12.0–15.0)
MCH: 32.5 pg (ref 26.0–34.0)
MCHC: 34.6 g/dL (ref 30.0–36.0)
MCV: 94.1 fL (ref 80.0–100.0)
Platelets: 201 10*3/uL (ref 150–400)
RBC: 3.72 MIL/uL — ABNORMAL LOW (ref 3.87–5.11)
RDW: 12.4 % (ref 11.5–15.5)
WBC: 6.5 10*3/uL (ref 4.0–10.5)
nRBC: 0 % (ref 0.0–0.2)

## 2018-05-23 LAB — LACTIC ACID, PLASMA
Lactic Acid, Venous: 2.1 mmol/L (ref 0.5–1.9)
Lactic Acid, Venous: 1.7 mmol/L (ref 0.5–1.9)

## 2018-05-23 MED ORDER — POTASSIUM CL IN DEXTROSE 5% 20 MEQ/L IV SOLN
20.0000 meq | INTRAVENOUS | Status: DC
  Administered 2018-05-23 – 2018-05-24 (×3): 20 meq via INTRAVENOUS
  Filled 2018-05-23 (×4): qty 1000

## 2018-05-23 MED ORDER — LORAZEPAM 2 MG/ML IJ SOLN: 2.0000 mg | INTRAMUSCULAR | Status: DC | PRN

## 2018-05-23 MED ORDER — LORAZEPAM 2 MG/ML IJ SOLN
1.0000 mg | Freq: Once | INTRAMUSCULAR | Status: AC
  Administered 2018-05-23: 1 mg via INTRAVENOUS
  Filled 2018-05-23: qty 1

## 2018-05-23 NOTE — Progress Notes (Signed)
PROGRESS NOTE  Sarah Gutierrez:[id number] DOB: Sep 21, 1945 DOA: 05/20/2018 PCP: Patient, No Pcp Per  HPI/Brief Narrative  BETHSAIDA SIEGENTHALER is a 73 y.o. year old female with medical history significant for alcoholism, HTN who presented on 05/20/2018 after being found down at home and was found to have hypothermia, and altered mental status.  Provided history by her brother Dr. Morrow(05/22/17) Prior to this hospitalization patient was treated for URI around Thanksgiving time and had improved since then.  At her baseline she lives alone.  She does drink quite a bit with no history of alcoholism.  Since this weekend patient has not been answering phone calls by family.  Her friend tried to come over during the same time post the door found to be locked if patient did not answer (cars and driveway).  The cops were called to evaluate and patient was found down couch, incontinent of urine unable to communicate.  Her brother reports at baseline she is able to converse and is alert and oriented x4, she has a Scientist, water quality.    In the ED, she was afebrile with normal respiratory rate and oxygen saturation.  Heart rate fluctuated between 48-81.  Blood pressure peaked at 170/80.  Lab work notable for RPR negative, HIV nonreactive  Glucose 109, CK unremarkable. CMP notable for sodium of 153, creatinine of 1.02, CO2 of 19, BUN of 43.,  Calcium 12 Initial troponin was 0.11 Lactic acid found to be 5.33 UA and urine culture was unremarkable UDS, ethanol, ammonia was unremarkable Blood cultures x2 were obtained CT abdomen and head showed no acute pathology Chest x-ray showed no active disease  She was empirically started on IV vancomycin, IV Flagyl and IV cefepime, D5 half-normal saline, IV potassium in the ED.   Subjective This morning patient was not very alert but was able to answer yes/no questions On repeat examination this evening she is much more alert able to express that she is not in any pain  still not oriented to place time or context but able to follow commands has no acute complaints  Assessment/Plan:  #Acute encephalopathy, improving.  Still disoriented to time place and context but much better it is a status as she is able to have a conversation with me and answer questions appropriately as well as following commands.  Resolved likely metabolic related to her dehydration/hyponatremia/hypercalcemia which is improved with IV fluids.  Infection was thought less likely given unremarkable procalcitonin so empiric vancomycin cefepime and Flagyl started in the ED has remained discontinued.  She has no focal neurologic deficits and CT head was unremarkable.  No other obvious signs for metabolic etiologies with normal ammonia, ethanol, UDS.  Continue treat with IV fluids, will need to pass swallow test before initiating diet  #AKI, resolved Prerenal etiology in setting of dehydration.  Holding home lisinopril continue IV fluids, monitor BMP  #Lactic acidosis, resolved Peak of 5.33 with downtrend to 2.1 after IV fluid boluses.  Do not suspect infection as mentioned above with unremarkable procalcitonin and no localizing signs or symptoms of infection.  #Hypothermia, resolved.  Unclear etiology doubt infectious, thin frame contributing?  Previous to require stepdown monitoring with Bair hugger in place that is now resolved  #Acute hypernatremia, resolved In the setting of dehydration as patient was down for some unknown period of time.  Peak sodium of 155 now 136.  Continue BMP.    #Acute hypercalcemia, resolved Elevated to 12 on admission, has improved significantly with IV fluids and now  resolved.  Likely elevated in the setting of dehydration as patient was down for unknown period of time.  #Mildly elevated troponin Peak troponin 0 0.11 down trended to 0.9 cardiology evaluated does not not suspect cardiac etiology given chest pain-free (still limited history), nonischemic EKG, TTE with  preserved EF and no wall motion abnormalities  History of alcoholism Monitor on CIWA for withdrawal.  Start IV supplementation with thiamine and folic given poor mentation for oral repletion.  Once able to tolerate p.o. intake will start multivitamin as well  Hypertension, currently at goal SBP ranging 129-137, DBP in the 50s.  Will hold on home amlodipine and lisinopril especially in the setting of lactic acidosis with no other clear etiologies.     Cultures:  1/7 blood cultures negative growth to date  Telemetry: Yes  DVT prophylaxis: Heparin  consultants:  Cardiology    Procedures:  None  Antimicrobials: 05/21/2018 IV vancomycin, IV cefepime, IV Flagyl  Code Status: Full code  Family Communication: No family at bedside, will update  Disposition Plan: Continue to monitor mental status, ability to follow commands/oral intake/swallow screen, currently requiring IV folate/thiamine supplementation.        Objective: Vitals:   05/23/18 0915 05/23/18 1000 05/23/18 1140 05/23/18 1355  BP: (!) 155/97 (!) 142/116 (!) 116/47 129/66  Pulse: 73 72 62 68  Resp: 20 17 15 16   Temp:    (!) 97.5 F (36.4 C)  TempSrc:    Oral  SpO2: 100% 100% 100% 90%  Weight:      Height:        Intake/Output Summary (Last 24 hours) at 05/23/2018 1844 Last data filed at 05/23/2018 1100 Gross per 24 hour  Intake 3336.47 ml  Output 3000 ml  Net 336.47 ml   Filed Weights   05/20/18 2019 05/21/18 1312  Weight: 40.8 kg 43.4 kg    Exam:  Constitutional:thin, elderly female Eyes: EOMI, anicteric, normal conjunctivae ENMT: yellow thrush on tongue, dry oral mucosa Neck: FROM, no thyromegaly Cardiovascular: RRR no MRGs, with no peripheral edema Respiratory: Normal respiratory effort on room air Abdomen: Soft,non-tender,  Skin: No rash ulcers, or lesions. Without skin tenting  Neurologic: able to answer yes and no, somnolent but easily arousable.  Conversant.  Moving upper and lower  extremities without any appreciable focal deficits.  Alert to self but not to place time or context. Data Reviewed: CBC: Recent Labs  Lab 05/20/18 2045 05/21/18 0753 05/22/18 0653 05/22/18 1000 05/23/18 0146  WBC 10.8* 10.3 6.4 6.6 6.5  NEUTROABS 9.7*  --  5.4 5.6  --   HGB 18.2* 15.4* 12.0 12.1 12.1  HCT 56.2* 48.2* 36.8 35.0* 35.0*  MCV 98.6 99.6 97.9 95.4 94.1  PLT 276 237 160 181 245   Basic Metabolic Panel: Recent Labs  Lab 05/21/18 0753 05/21/18 0857 05/21/18 1814 05/22/18 1000 05/23/18 0146  NA 155* 153* 147* 139 136  K 3.3* 3.7 4.0 3.5 3.9  CL 120* 128* 121* 113* 110  CO2 20* 19* 18* 19* 18*  GLUCOSE 130* 123* 171* 133* 144*  BUN 48* 43* 33* 17 9  CREATININE 1.17* 1.02* 0.96 0.77 0.75  CALCIUM 11.0* 10.2 10.1 9.1 8.7*  MG  --  2.2  --  1.6*  --   PHOS  --  2.3*  --  1.8*  --    GFR: Estimated Creatinine Clearance: 43.6 mL/min (by C-G formula based on SCr of 0.75 mg/dL). Liver Function Tests: Recent Labs  Lab 05/20/18 2045 05/21/18 0857 05/22/18  1000 05/23/18 0146  AST 56* 35 31 28  ALT 75* 47* 38 37  ALKPHOS 88 58 53 56  BILITOT 1.1 1.0 0.7 0.8  PROT 8.4* 6.1* 5.1* 5.4*  ALBUMIN 4.1 2.8* 2.3* 2.5*   No results for input(s): LIPASE, AMYLASE in the last 168 hours. Recent Labs  Lab 05/20/18 2033  AMMONIA 20   Coagulation Profile: No results for input(s): INR, PROTIME in the last 168 hours. Cardiac Enzymes: Recent Labs  Lab 05/20/18 2045 05/21/18 0121 05/21/18 0753 05/21/18 0857 05/21/18 1814 05/21/18 2317  CKTOTAL 53  --   --  61  --   --   CKMB  --   --   --  7.5*  --   --   TROPONINI  --  0.11* 0.09*  --  0.07* 0.04*   BNP (last 3 results) No results for input(s): PROBNP in the last 8760 hours. HbA1C: Recent Labs    05/21/18 0417  HGBA1C 5.5   CBG: Recent Labs  Lab 05/20/18 2114 05/21/18 0125 05/21/18 1309 05/21/18 1709  GLUCAP 130* 147* 109* 151*   Lipid Profile: Recent Labs    05/21/18 0418  CHOL 121  HDL 38*    LDLCALC 55  TRIG 140  CHOLHDL 3.2   Thyroid Function Tests: Recent Labs    05/21/18 0417  TSH 1.666   Anemia Panel: Recent Labs    05/21/18 0857  VITAMINB12 1,436*   Urine analysis:    Component Value Date/Time   COLORURINE AMBER (A) 05/21/2018 0150   APPEARANCEUR CLEAR 05/21/2018 0150   LABSPEC 1.020 05/21/2018 0150   PHURINE 5.0 05/21/2018 0150   GLUCOSEU NEGATIVE 05/21/2018 0150   HGBUR NEGATIVE 05/21/2018 0150   BILIRUBINUR NEGATIVE 05/21/2018 0150   KETONESUR 5 (A) 05/21/2018 0150   PROTEINUR NEGATIVE 05/21/2018 0150   NITRITE NEGATIVE 05/21/2018 0150   LEUKOCYTESUR NEGATIVE 05/21/2018 0150   Sepsis Labs: @LABRCNTIP (procalcitonin:4,lacticidven:4)  ) Recent Results (from the past 240 hour(s))  Blood culture (routine x 2)     Status: None (Preliminary result)   Collection Time: 05/20/18  8:15 PM  Result Value Ref Range Status   Specimen Description BLOOD LEFT FOREARM  Final   Special Requests   Final    BOTTLES DRAWN AEROBIC AND ANAEROBIC Blood Culture results may not be optimal due to an inadequate volume of blood received in culture bottles   Culture   Final    NO GROWTH 3 DAYS Performed at [facility], [place] [address] [place], [place] [address]    Report Status PENDING  Incomplete  Blood culture (routine x 2)     Status: None (Preliminary result)   Collection Time: 05/20/18  8:29 PM  Result Value Ref Range Status   Specimen Description BLOOD LEFT WRIST  Final   Special Requests   Final    BOTTLES DRAWN AEROBIC ONLY Blood Culture results may not be optimal due to an inadequate volume of blood received in culture bottles   Culture   Final    NO GROWTH 3 DAYS Performed at [facility], [place] [address], [place],  [address]    Report Status PENDING  Incomplete  Urine Culture     Status: None   Collection Time: 05/21/18  1:50 AM  Result Value Ref Range Status   Specimen Description URINE, CATHETERIZED  Final   Special Requests NONE   Final   Culture   Final    NO GROWTH Performed at [facility],  [address] [place], [place] [address]    Report Status 05/22/2018 FINAL  Final      Studies: No results found.  Scheduled Meds: . aspirin  300 mg Rectal Daily  . folic acid  1 mg Intravenous Daily  . heparin  5,000 Units Subcutaneous Q8H  . mouth rinse  15 mL Mouth Rinse BID  . thiamine injection  100 mg Intravenous Daily    Continuous Infusions: . dextrose 5 % with KCl 20 mEq / L 20 mEq (05/23/18 1531)     LOS: 2 days     Desiree Hane, MD [facility] Pager [phone]  If 7PM-7AM, please contact night-coverage [email or web address] Password [facility] 05/23/2018, 6:44 PM

## 2018-05-23 NOTE — Social Work (Signed)
CSW acknowledging consult for SNF placement. Will follow for therapy recommendations and medical stability  Westley Hummer, MSW, [place] [facility] [phone]

## 2018-05-24 LAB — BASIC METABOLIC PANEL
Anion gap: 8 (ref 5–15)
BUN: <5 mg/dL — ABNORMAL LOW (ref 8–23)
CO2: 22 mmol/L (ref 22–32)
Calcium: 9.4 mg/dL (ref 8.9–10.3)
Chloride: 109 mmol/L (ref 98–111)
Creatinine, Ser: 0.64 mg/dL (ref 0.44–1.00)
GFR calc Af Amer: >60 mL/min (ref >60)
GFR calc non Af Amer: >60 mL/min (ref >60)
Glucose, Bld: 111 mg/dL — ABNORMAL HIGH (ref 70–99)
Potassium: 4.4 mmol/L (ref 3.5–5.1)
Sodium: 139 mmol/L (ref 135–145)

## 2018-05-24 MED ORDER — ADULT MULTIVITAMIN W/MINERALS CH
1.0000 | ORAL_TABLET | Freq: Every day | ORAL | Status: DC
  Administered 2018-05-24 – 2018-06-01 (×8): 1 via ORAL
  Filled 2018-05-24 (×8): qty 1

## 2018-05-24 MED ORDER — FOLIC ACID 1 MG PO TABS
1.0000 mg | ORAL_TABLET | Freq: Every day | ORAL | Status: DC
  Administered 2018-05-24 – 2018-06-01 (×8): 1 mg via ORAL
  Filled 2018-05-24 (×8): qty 1

## 2018-05-24 MED ORDER — ENSURE ENLIVE PO LIQD
237.0000 mL | Freq: Three times a day (TID) | ORAL | Status: DC
  Administered 2018-05-25 – 2018-05-26 (×6): 237 mL via ORAL

## 2018-05-24 MED ORDER — AMLODIPINE BESYLATE 2.5 MG PO TABS
2.5000 mg | ORAL_TABLET | Freq: Every day | ORAL | Status: DC
  Administered 2018-05-24 – 2018-05-27 (×4): 2.5 mg via ORAL
  Filled 2018-05-24 (×4): qty 1

## 2018-05-24 MED ORDER — SODIUM CHLORIDE 0.9 % IV SOLN
INTRAVENOUS | Status: AC
  Administered 2018-05-24 – 2018-05-25 (×3): via INTRAVENOUS

## 2018-05-24 MED ORDER — SODIUM CHLORIDE 0.9% FLUSH
10.0000 mL | INTRAVENOUS | Status: DC | PRN
  Administered 2018-05-27 – 2018-05-29 (×2): 10 mL
  Filled 2018-05-24 (×2): qty 40

## 2018-05-24 MED ORDER — VITAMIN B-1 100 MG PO TABS
100.0000 mg | ORAL_TABLET | Freq: Every day | ORAL | Status: DC
  Administered 2018-05-24 – 2018-06-01 (×8): 100 mg via ORAL
  Filled 2018-05-24 (×8): qty 1

## 2018-05-24 NOTE — Care Management Important Message (Signed)
Important Message  Patient Details  Name: Sarah Gutierrez MRN: [id number] Date of Birth: 1945-12-21   Medicare Important Message Given:  Yes    [name] [address] 05/24/2018, 3:50 PM

## 2018-05-24 NOTE — Plan of Care (Signed)
  Problem: Clinical Measurements: Goal: Will remain free from infection Outcome: Progressing   Problem: Activity: Goal: Risk for activity intolerance will decrease Outcome: Progressing   Problem: Nutrition: Goal: Adequate nutrition will be maintained Outcome: Progressing   Problem: Coping: Goal: Level of anxiety will decrease Outcome: Progressing   Problem: Pain Managment: Goal: General experience of comfort will improve Outcome: Progressing   Problem: Skin Integrity: Goal: Risk for impaired skin integrity will decrease Outcome: Progressing   

## 2018-05-24 NOTE — Progress Notes (Signed)
Patient very calm and cooperative. Removed patient's mittens.

## 2018-05-24 NOTE — Progress Notes (Signed)
PROGRESS NOTE  AMINTA SAKURAI DGU:[id number] DOB: [birth year] DOA: 05/20/2018 PCP: Patient, No Pcp Per  HPI/Brief Narrative  BLUMA BURESH is a 73 y.o. year old female with medical history significant for alcoholism, HTN who presented on 05/20/2018 after being found down at home for unknown period of time and was found to have hypothermia, and altered mental status.  Provided history by her brother Dr. Morrow(05/22/17) Prior to this hospitalization patient was treated for URI around Thanksgiving time and had improved since then.  At her baseline she lives alone.  She does drink quite a bit with no history of alcoholism.  Since this weekend patient has not been answering phone calls by family.  Her friend tried to come over during the same time post the door found to be locked if patient did not answer (cars and driveway).  The cops were called to evaluate and patient was found down couch, incontinent of urine unable to communicate.  Her brother reports at baseline she is able to converse and is alert and oriented x4, she has a Scientist, water quality.    In the ED, she was afebrile with normal respiratory rate and oxygen saturation.  Heart rate fluctuated between 48-81.  Blood pressure peaked at 170/80.  Lab work notable for RPR negative, HIV nonreactive  Glucose 109, CK unremarkable. CMP notable for sodium of 153, creatinine of 1.02, CO2 of 19, BUN of 43.,  Calcium 12 Initial troponin was 0.11 Lactic acid found to be 5.33 UA and urine culture was unremarkable UDS, ethanol, ammonia was unremarkable Blood cultures x2 were obtained CT abdomen and head showed no acute pathology Chest x-ray showed no active disease  She was empirically started on IV vancomycin, IV Flagyl and IV cefepime, D5 half-normal saline, IV potassium in the ED.   Subjective Has no complaints No acute events overnight Ready to try to eat  Assessment/Plan:  #Acute encephalopathy, resolved.  Able to have a normal conversation  with me the only oriented to self.  I do suspect some baseline level of poor cognitive impairment possibly related to substance abuse/alcoholism is unclear at this point.  We will continue updates with family.    #AKI, resolved Prerenal etiology in setting of dehydration.  Holding home lisinopril continue IV fluids, monitor BMP  #Lactic acidosis, resolved Peak of 5.33 with downtrend to 2.1 after IV fluid boluses.  Do not suspect infection as mentioned above with unremarkable procalcitonin and no localizing signs or symptoms of infection.  #Hypothermia, resolved.  Unclear etiology doubt infectious, thin frame contributing?  Previous to require stepdown monitoring with Bair hugger in place that is now resolved  #Acute hypernatremia, resolved In the setting of dehydration as patient was down for some unknown period of time.  Peak sodium of 155 now 136.  Continue BMP.    #Acute hypercalcemia, resolved Elevated to 12 on admission, has improved significantly with IV fluids and now resolved.  Likely elevated in the setting of dehydration as patient was down for unknown period of time.  #Mildly elevated troponin Peak troponin 0 0.11 down trended to 0.9 cardiology evaluated does not not suspect cardiac etiology given chest pain-free (still limited history), nonischemic EKG, TTE with preserved EF and no wall motion abnormalities  History of alcoholism Monitor on CIWA for withdrawal.  Start IV supplementation with thiamine and folic given poor mentation for oral repletion.  Once able to tolerate p.o. intake will start multivitamin as well  Hypertension, slightly elevated Systolic blood pressures in the 140s.  We will continue holding lisinopril given previous creatinine elevation will restart low-dose home amlodipine and monitor closely    Cultures:  1/7 blood cultures negative growth to date  Telemetry: Yes  DVT prophylaxis: Heparin  consultants:  Cardiology     Procedures:  None  Antimicrobials: 05/21/2018 IV vancomycin, IV cefepime, IV Flagyl  Code Status: Full code  Family Communication: No family at bedside, will update  Disposition Plan: Transition to oral medications, blood pressure control, needs PT/OT evaluation, update family        Objective: Vitals:   05/23/18 1355 05/23/18 2121 05/24/18 0515 05/24/18 1340  BP: 129/66 (!) 148/92 133/74 (!) 141/92  Pulse: 68 64 66 75  Resp: 16 16  17   Temp: (!) 97.5 F (36.4 C) (!) 97.2 F (36.2 C)  (!) 97.5 F (36.4 C)  TempSrc: Oral Oral  Oral  SpO2: 90% 100% 100% 100%  Weight:      Height:        Intake/Output Summary (Last 24 hours) at 05/24/2018 1715 Last data filed at 05/24/2018 1654 Gross per 24 hour  Intake 0 ml  Output 3102 ml  Net -3102 ml   Filed Weights   05/20/18 2019 05/21/18 1312  Weight: 40.8 kg 43.4 kg    Exam:  Constitutional:thin, cachectic,, elderly female, no distress Eyes: EOMI, anicteric, normal conjunctivae ENMT:  dry oral mucosa Neck: FROM Cardiovascular: RRR no MRGs, with no peripheral edema Respiratory: Normal respiratory effort on room air Abdomen: Soft,non-tender,  Skin: No rash ulcers, or lesions. Without skin tenting  Neurologic:  Moving upper and lower extremities without any appreciable focal deficits.  Alert to self but not to place time or context. Data Reviewed: CBC: Recent Labs  Lab 05/20/18 2045 05/21/18 0753 05/22/18 0653 05/22/18 1000 05/23/18 0146  WBC 10.8* 10.3 6.4 6.6 6.5  NEUTROABS 9.7*  --  5.4 5.6  --   HGB 18.2* 15.4* 12.0 12.1 12.1  HCT 56.2* 48.2* 36.8 35.0* 35.0*  MCV 98.6 99.6 97.9 95.4 94.1  PLT 276 237 160 181 086   Basic Metabolic Panel: Recent Labs  Lab 05/21/18 0857 05/21/18 1814 05/22/18 1000 05/23/18 0146 05/24/18 0613  NA 153* 147* 139 136 139  K 3.7 4.0 3.5 3.9 4.4  CL 128* 121* 113* 110 109  CO2 19* 18* 19* 18* 22  GLUCOSE 123* 171* 133* 144* 111*  BUN 43* 33* 17 9 <5*  CREATININE  1.02* 0.96 0.77 0.75 0.64  CALCIUM 10.2 10.1 9.1 8.7* 9.4  MG 2.2  --  1.6*  --   --   PHOS 2.3*  --  1.8*  --   --    GFR: Estimated Creatinine Clearance: 43.6 mL/min (by C-G formula based on SCr of 0.64 mg/dL). Liver Function Tests: Recent Labs  Lab 05/20/18 2045 05/21/18 0857 05/22/18 1000 05/23/18 0146  AST 56* 35 31 28  ALT 75* 47* 38 37  ALKPHOS 88 58 53 56  BILITOT 1.1 1.0 0.7 0.8  PROT 8.4* 6.1* 5.1* 5.4*  ALBUMIN 4.1 2.8* 2.3* 2.5*   No results for input(s): LIPASE, AMYLASE in the last 168 hours. Recent Labs  Lab 05/20/18 2033  AMMONIA 20   Coagulation Profile: No results for input(s): INR, PROTIME in the last 168 hours. Cardiac Enzymes: Recent Labs  Lab 05/20/18 2045 05/21/18 0121 05/21/18 0753 05/21/18 0857 05/21/18 1814 05/21/18 2317  CKTOTAL 53  --   --  61  --   --   CKMB  --   --   --  7.5*  --   --   TROPONINI  --  0.11* 0.09*  --  0.07* 0.04*   BNP (last 3 results) No results for input(s): PROBNP in the last 8760 hours. HbA1C: No results for input(s): HGBA1C in the last 72 hours. CBG: Recent Labs  Lab 05/20/18 2114 05/21/18 0125 05/21/18 1309 05/21/18 1709  GLUCAP 130* 147* 109* 151*   Lipid Profile: No results for input(s): CHOL, HDL, LDLCALC, TRIG, CHOLHDL, LDLDIRECT in the last 72 hours. Thyroid Function Tests: No results for input(s): TSH, T4TOTAL, FREET4, T3FREE, THYROIDAB in the last 72 hours. Anemia Panel: No results for input(s): VITAMINB12, FOLATE, FERRITIN, TIBC, IRON, RETICCTPCT in the last 72 hours. Urine analysis:    Component Value Date/Time   COLORURINE AMBER (A) 05/21/2018 0150   APPEARANCEUR CLEAR 05/21/2018 0150   LABSPEC 1.020 05/21/2018 0150   PHURINE 5.0 05/21/2018 0150   GLUCOSEU NEGATIVE 05/21/2018 0150   HGBUR NEGATIVE 05/21/2018 0150   BILIRUBINUR NEGATIVE 05/21/2018 0150   KETONESUR 5 (A) 05/21/2018 0150   PROTEINUR NEGATIVE 05/21/2018 0150   NITRITE NEGATIVE 05/21/2018 0150   LEUKOCYTESUR NEGATIVE  05/21/2018 0150   Sepsis Labs: @LABRCNTIP (procalcitonin:4,lacticidven:4)  ) Recent Results (from the past 240 hour(s))  Blood culture (routine x 2)     Status: None (Preliminary result)   Collection Time: 05/20/18  8:15 PM  Result Value Ref Range Status   Specimen Description BLOOD LEFT FOREARM  Final   Special Requests   Final    BOTTLES DRAWN AEROBIC AND ANAEROBIC Blood Culture results may not be optimal due to an inadequate volume of blood received in culture bottles   Culture   Final    NO GROWTH 4 DAYS Performed at [facility], [place] [address], [place], [place] [address]    Report Status PENDING  Incomplete  Blood culture (routine x 2)     Status: None (Preliminary result)   Collection Time: 05/20/18  8:29 PM  Result Value Ref Range Status   Specimen Description BLOOD LEFT WRIST  Final   Special Requests   Final    BOTTLES DRAWN AEROBIC ONLY Blood Culture results may not be optimal due to an inadequate volume of blood received in culture bottles   Culture   Final    NO GROWTH 4 DAYS Performed at [facility], [place] [address], [place], [place] [address]    Report Status PENDING  Incomplete  Urine Culture     Status: None   Collection Time: 05/21/18  1:50 AM  Result Value Ref Range Status   Specimen Description URINE, CATHETERIZED  Final   Special Requests NONE  Final   Culture   Final    NO GROWTH Performed at [facility], [place] [address], [place], [place] [address]    Report Status 05/22/2018 FINAL  Final      Studies: No results found.  Scheduled Meds: . feeding supplement (ENSURE ENLIVE)  237 mL Oral TID BM  . folic acid  1 mg Oral Daily  . heparin  5,000 Units Subcutaneous Q8H  . mouth rinse  15 mL Mouth Rinse BID  . multivitamin with minerals  1 tablet Oral Daily  . thiamine  100 mg Oral Daily    Continuous Infusions: . sodium chloride       LOS: 3 days     Desiree Hane, MD [facility] Pager [phone]  If  7PM-7AM, please contact night-coverage [email or web address] Password [email or web address] 05/24/2018, 5:15 PM

## 2018-05-24 NOTE — Plan of Care (Signed)
  Problem: Clinical Measurements: Goal: Will remain free from infection Outcome: Progressing   Problem: Coping: Goal: Level of anxiety will decrease Outcome: Progressing   Problem: Pain Managment: Goal: General experience of comfort will improve Outcome: Progressing   Problem: Safety: Goal: Ability to remain free from injury will improve Outcome: Progressing   Problem: Skin Integrity: Goal: Risk for impaired skin integrity will decrease Outcome: Progressing   

## 2018-05-24 NOTE — Progress Notes (Signed)
Initial Nutrition Assessment  DOCUMENTATION CODES:   Severe malnutrition in context of acute illness/injury, Underweight  INTERVENTION:   -Ensure Enlive po TID, each supplement provides 350 kcal and 20 grams of protein -MVI with minerals daily  NUTRITION DIAGNOSIS:   Severe Malnutrition related to acute illness as evidenced by energy intake < or equal to 50% for > or equal to 5 days, mild fat depletion, moderate fat depletion, moderate muscle depletion, severe muscle depletion.  GOAL:   Patient will meet greater than or equal to 90% of their needs  MONITOR:   PO intake, Supplement acceptance, Labs, Weight trends, Skin, I & O's  REASON FOR ASSESSMENT:   Low Braden    ASSESSMENT:   Sarah Gutierrez is a 73 y.o. female with medical history significant of hypertension, remote alcohol abuse, who presents with altered mental status.  Pt admitted with acute metabolic encephalopathy.  Reviewed I/O's: -2.4 L x 24 hours and -474 ml since admission  Spoke mainly with pt sister at bedside. Pt pleasant, but unable to provide detailed history. Per pt sister, pt's mentation has improved greatly since admission- she was able to recognize her this AM as compared to yesterday when she was confused and unable to recognize family members. Pt sister reports pt is a very private person and does not often share details about her life to anyone.   Sister reports that pt was found down in her home PTA. Pt was without adequate food for several days- her house had no food other than a jug of milk that had expired 3 weeks prior. Sister shares that pt has a history of alcohol abuse, but unable to confirm if alcohol use is related to this incident.   Pt is thin at baseline, but sister reports noticeable wt loss (she last saw pt on Christmas and has a significantly different appearance). She reports pt is "skin and bones". She is unsure of UBW but reports that she typically wears a 6-8 dress size. No recent  wt hx available to assess changes.   Pt reports she is alert enough to eat, but she "doesn't want to right now- maybe later". Diet was just advanced to soft diet. RD will add supplements to optimize intake and nutritional status. Discussed importance of good meal and supplement intake to promote healing.   Labs reviewed.   NUTRITION - FOCUSED PHYSICAL EXAM:    Most Recent Value  Orbital Region  Mild depletion  Upper Arm Region  Moderate depletion  Thoracic and Lumbar Region  Moderate depletion  Buccal Region  Mild depletion  Temple Region  Mild depletion  Clavicle Bone Region  Mild depletion  Clavicle and Acromion Bone Region  Mild depletion  Scapular Bone Region  Mild depletion  Dorsal Hand  Moderate depletion  Patellar Region  Severe depletion  Anterior Thigh Region  Severe depletion  Posterior Calf Region  Severe depletion  Edema (RD Assessment)  None  Hair  Reviewed  Eyes  Reviewed  Mouth  Reviewed  Skin  Reviewed  Nails  Reviewed       Diet Order:   Diet Order            DIET SOFT Room service appropriate? Yes; Fluid consistency: Thin  Diet effective now              EDUCATION NEEDS:   Education needs have been addressed  Skin:  Skin Assessment: Skin Integrity Issues: Skin Integrity Issues:: Stage I, Stage II Stage I: sacrum Stage II:  buttocks  Last BM:  05/23/18  Height:   Ht Readings from Last 1 Encounters:  05/21/18 5\' 6"  (1.676 m)    Weight:   Wt Readings from Last 1 Encounters:  05/21/18 43.4 kg    Ideal Body Weight:  59.1 kg  BMI:  Body mass index is 15.44 kg/m.  Estimated Nutritional Needs:   Kcal:  1500-1700  Protein:  85-100 grams  Fluid:  > 1.5 L    Ameya Vowell A. Jimmye Norman, RD, LDN, CDE Pager: [phone] After hours Pager: [phone]

## 2018-05-25 LAB — CULTURE, BLOOD (ROUTINE X 2)
Culture: NO GROWTH
Culture: NO GROWTH

## 2018-05-25 NOTE — Evaluation (Signed)
Physical Therapy Evaluation Patient Details Name: Sarah Gutierrez MRN: [id number] DOB: Sep 03, 1945 Today's Date: 05/25/2018   History of Present Illness  Pt is a 73 y.o. female admitted 05/20/18 after being found down at home for unknown period of time; pt with hypothermia, AKI and AMS. Head CT negative for acute abnormality. PMH includes HTN.    Clinical Impression  Pt presents with an overall decrease in functional mobility secondary to above. Pt lethargic and only oriented to name "Sarah Gutierrez" and birth month, unable to say full name. Poor historian and confused; per chart, lives alone. Today, pt required max encouragement for OOB mobility, requiring modA to stand with RW; assist to maintain standing balance, unable to accept challenge. Pt declining further mobility secondary to fatigue. Pt would benefit from continued acute PT services to maximize functional mobility and independence prior to d/c with SNF-level therapies.     Follow Up Recommendations SNF;Supervision for mobility/OOB    Equipment Recommendations  (TBD)    Recommendations for Other Services       Precautions / Restrictions Precautions Precautions: Fall Restrictions Weight Bearing Restrictions: No      Mobility  Bed Mobility Overal bed mobility: Modified Independent             General bed mobility comments: Increased time and effort, pt lethargic  Transfers Overall transfer level: Needs assistance Equipment used: Rolling walker (2 wheeled) Transfers: Sit to/from Stand Sit to Stand: Mod assist         General transfer comment: ModA to assist trunk elevation and maintain balance upon standing; pt with posterior lean, bilateral legs leaning on bed to maintain upright, modA to prevent posterior LOB. Began to initiate side step, then uncontrolled sit to bed  Ambulation/Gait             General Gait Details: Pt declined  Stairs            Wheelchair Mobility    Modified Rankin (Stroke Patients  Only)       Balance Overall balance assessment: Needs assistance   Sitting balance-Leahy Scale: Fair Sitting balance - Comments: Poor awareness to adjust sitting balance in order to maintain upright with BLE MMT testing seated EOB; able to correct with cues     Standing balance-Leahy Scale: Poor                               Pertinent Vitals/Pain Pain Assessment: No/denies pain    Home Living Family/patient expects to be discharged to:: Private residence Living Arrangements: Alone               Additional Comments: Pt poor historian; per chart, lives alone. Pt reports "I've owned a lot of equipment" but unable to specify further details    Prior Function           Comments: Pt poor historian, reports she uses device, but answers "yes" when asked about multiple specific ones. Per RN/chart review, baseline is independent and living alone; neighbors provide groceries     Hand Dominance        Extremity/Trunk Assessment   Upper Extremity Assessment Upper Extremity Assessment: Generalized weakness    Lower Extremity Assessment Lower Extremity Assessment: Generalized weakness(BLE >3/5 strength functionally)       Communication   Communication: No difficulties  Cognition Arousal/Alertness: Lethargic;Suspect due to medications Behavior During Therapy: Flat affect Overall Cognitive Status: No family/caregiver present to determine baseline cognitive functioning Area of  Impairment: Orientation;Attention;Memory;Following commands;Safety/judgement;Awareness;Problem solving                 Orientation Level: Disoriented to;Person;Place;Time;Situation Current Attention Level: Focused Memory: Decreased short-term memory Following Commands: Follows one step commands inconsistently;Follows one step commands with increased time Safety/Judgement: Decreased awareness of safety;Decreased awareness of deficits Awareness: Intellectual Problem Solving:  Slow processing;Decreased initiation;Difficulty sequencing;Requires verbal cues General Comments: Able to state name is "Sarah Gutierrez" (which apparently is name pt goes by), but unable to state full name or birth date/year (knew October). Stated she was in [place], surprised to hear she is in [place]; reoriented, then 1 minute later, pt asking "where I am?"       General Comments      Exercises     Assessment/Plan    PT Assessment Patient needs continued PT services  PT Problem List Decreased strength;Decreased activity tolerance;Decreased balance;Decreased mobility;Decreased cognition;Decreased knowledge of use of DME;Decreased safety awareness       PT Treatment Interventions DME instruction;Gait training;Stair training;Functional mobility training;Therapeutic activities;Therapeutic exercise;Balance training;Cognitive remediation;Patient/family education    PT Goals (Current goals can be found in the Care Plan section)  Acute Rehab PT Goals Patient Stated Goal: Lay back down PT Goal Formulation: With patient Time For Goal Achievement: 06/08/18 Potential to Achieve Goals: Fair    Frequency Min 2X/week   Barriers to discharge Decreased caregiver support      Co-evaluation               AM-PAC PT "6 Clicks" Mobility  Outcome Measure Help needed turning from your back to your side while in a flat bed without using bedrails?: None Help needed moving from lying on your back to sitting on the side of a flat bed without using bedrails?: None Help needed moving to and from a bed to a chair (including a wheelchair)?: A Lot Help needed standing up from a chair using your arms (e.g., wheelchair or bedside chair)?: A Lot Help needed to walk in hospital room?: A Lot Help needed climbing 3-5 steps with a railing? : Total 6 Click Score: 15    End of Session   Activity Tolerance: Patient limited by fatigue;Patient limited by lethargy Patient left: in bed;with call bell/phone  within reach;with bed alarm set Nurse Communication: Mobility status PT Visit Diagnosis: Other abnormalities of gait and mobility (R26.89);Muscle weakness (generalized) (M62.81)    Time: 4650-3546 PT Time Calculation (min) (ACUTE ONLY): 22 min   Charges:   PT Evaluation $PT Eval Moderate Complexity: [place], PT, DPT Acute Rehabilitation Services  Pager [phone] Office [place] 05/25/2018, 5:06 PM

## 2018-05-25 NOTE — Progress Notes (Signed)
PROGRESS NOTE  Sarah Gutierrez YCX:[id number] DOB: Oct 07, 1945 DOA: 05/20/2018 PCP: Patient, No Pcp Per  HPI/Brief Narrative  Sarah Gutierrez is a 73 y.o. year old female with medical history significant for alcoholism, HTN who presented on 05/20/2018 after being found down at home for unknown period of time and was found to have hypothermia, and altered mental status.  Provided history by her brother Dr. Morrow(05/22/17) Prior to this hospitalization patient was treated for URI around Thanksgiving time and had improved since then.  At her baseline she lives alone.  She does drink quite a bit with no history of alcoholism.  Since this weekend patient has not been answering phone calls by family.  Her friend tried to come over during the same time post the door found to be locked if patient did not answer (cars and driveway).  The cops were called to evaluate and patient was found down couch, incontinent of urine unable to communicate.  Her brother reports at baseline she is able to converse and is alert and oriented x4, she has a Scientist, water quality.    In the ED, she was afebrile with normal respiratory rate and oxygen saturation.  Heart rate fluctuated between 48-81.  Blood pressure peaked at 170/80.  Lab work notable for RPR negative, HIV nonreactive  Glucose 109, CK unremarkable. CMP notable for sodium of 153, creatinine of 1.02, CO2 of 19, BUN of 43.,  Calcium 12 Initial troponin was 0.11 Lactic acid found to be 5.33 UA and urine culture was unremarkable UDS, ethanol, ammonia was unremarkable Blood cultures x2 were obtained CT abdomen and head showed no acute pathology Chest x-ray showed no active disease  She was empirically started on IV vancomycin, IV Flagyl and IV cefepime, D5 half-normal saline, IV potassium in the ED.   Subjective No complaints, just wants to sleep  Assessment/Plan:  #Acute encephalopathy, resolved.  Oriented to self and place, conversant, no focal deficits.   I do  suspect some baseline level of poor cognitive impairment possibly related to substance abuse/alcoholism is unclear at this point.  We will continue updates with family.    #AKI, resolved Prerenal etiology in setting of dehydration.  Holding home lisinopril continue IV fluids, monitor BMP  #Lactic acidosis, resolved Peak of 5.33 with downtrend to 2.1 after IV fluid boluses.  Do not suspect infection as mentioned above with unremarkable procalcitonin and no localizing signs or symptoms of infection.  #Hypothermia, resolved.  Unclear etiology doubt infectious, thin frame contributing?  Previous to require stepdown monitoring with Bair hugger in place that is now resolved  #Acute hypernatremia, resolved In the setting of dehydration as patient was down for some unknown period of time.  Peak sodium of 155 now 136.  Continue BMP.    #Acute hypercalcemia, resolved Elevated to 12 on admission, has improved significantly with IV fluids and now resolved.  Likely elevated in the setting of dehydration as patient was down for unknown period of time.  #Mildly elevated troponin Peak troponin 0 0.11 down trended to 0.9 cardiology evaluated does not not suspect cardiac etiology given chest pain-free (still limited history), nonischemic EKG, TTE with preserved EF and no wall motion abnormalities  History of alcoholism Monitor on CIWA for withdrawal.  Start IV supplementation with thiamine and folic given poor mentation for oral repletion.  Once able to tolerate p.o. intake will start multivitamin as well  Hypertension, slightly elevated Monitor on low dose amlodipine 2.5 mg and titrate up as needed   Cultures:  1/7 blood cultures negative growth to date  Telemetry: Yes  DVT prophylaxis: Heparin  consultants:  Cardiology    Procedures:  None  Antimicrobials: 05/21/2018 IV vancomycin, IV cefepime, IV Flagyl  Code Status: Full code  Family Communication: No family at bedside, will  update  Disposition Plan:  blood pressure control, needs PT/OT evaluation, update family        Objective: Vitals:   05/24/18 0515 05/24/18 1340 05/24/18 2119 05/25/18 0600  BP: 133/74 (!) 141/92 (!) 126/59 (!) 146/92  Pulse: 66 75 92 74  Resp:  17 16 15   Temp:  (!) 97.5 F (36.4 C) 97.9 F (36.6 C) 97.8 F (36.6 C)  TempSrc:  Oral    SpO2: 100% 100% 100% 100%  Weight:      Height:        Intake/Output Summary (Last 24 hours) at 05/25/2018 0815 Last data filed at 05/25/2018 0500 Gross per 24 hour  Intake 233.24 ml  Output 2050 ml  Net -1816.76 ml   Filed Weights   05/20/18 2019 05/21/18 1312  Weight: 40.8 kg 43.4 kg    Exam:  Constitutional:thin, cachectic,, elderly female, no distress Eyes: EOMI, anicteric, normal conjunctivae ENMT:  dry oral mucosa Neck: FROM Cardiovascular: RRR no MRGs, with no peripheral edema Respiratory: Normal respiratory effort on room air Abdomen: Soft,non-tender,  Skin: No rash ulcers, or lesions. Without skin tenting  Neurologic:  Moving upper and lower extremities without any appreciable focal deficits.  Alert to self and place(hospital) but not to time or context. Data Reviewed: CBC: Recent Labs  Lab 05/20/18 2045 05/21/18 0753 05/22/18 0653 05/22/18 1000 05/23/18 0146  WBC 10.8* 10.3 6.4 6.6 6.5  NEUTROABS 9.7*  --  5.4 5.6  --   HGB 18.2* 15.4* 12.0 12.1 12.1  HCT 56.2* 48.2* 36.8 35.0* 35.0*  MCV 98.6 99.6 97.9 95.4 94.1  PLT 276 237 160 181 366   Basic Metabolic Panel: Recent Labs  Lab 05/21/18 0857 05/21/18 1814 05/22/18 1000 05/23/18 0146 05/24/18 0613  NA 153* 147* 139 136 139  K 3.7 4.0 3.5 3.9 4.4  CL 128* 121* 113* 110 109  CO2 19* 18* 19* 18* 22  GLUCOSE 123* 171* 133* 144* 111*  BUN 43* 33* 17 9 <5*  CREATININE 1.02* 0.96 0.77 0.75 0.64  CALCIUM 10.2 10.1 9.1 8.7* 9.4  MG 2.2  --  1.6*  --   --   PHOS 2.3*  --  1.8*  --   --    GFR: Estimated Creatinine Clearance: 43.6 mL/min (by C-G formula  based on SCr of 0.64 mg/dL). Liver Function Tests: Recent Labs  Lab 05/20/18 2045 05/21/18 0857 05/22/18 1000 05/23/18 0146  AST 56* 35 31 28  ALT 75* 47* 38 37  ALKPHOS 88 58 53 56  BILITOT 1.1 1.0 0.7 0.8  PROT 8.4* 6.1* 5.1* 5.4*  ALBUMIN 4.1 2.8* 2.3* 2.5*   No results for input(s): LIPASE, AMYLASE in the last 168 hours. Recent Labs  Lab 05/20/18 2033  AMMONIA 20   Coagulation Profile: No results for input(s): INR, PROTIME in the last 168 hours. Cardiac Enzymes: Recent Labs  Lab 05/20/18 2045 05/21/18 0121 05/21/18 0753 05/21/18 0857 05/21/18 1814 05/21/18 2317  CKTOTAL 53  --   --  61  --   --   CKMB  --   --   --  7.5*  --   --   TROPONINI  --  0.11* 0.09*  --  0.07* 0.04*  BNP (last 3 results) No results for input(s): PROBNP in the last 8760 hours. HbA1C: No results for input(s): HGBA1C in the last 72 hours. CBG: Recent Labs  Lab 05/20/18 2114 05/21/18 0125 05/21/18 1309 05/21/18 1709  GLUCAP 130* 147* 109* 151*   Lipid Profile: No results for input(s): CHOL, HDL, LDLCALC, TRIG, CHOLHDL, LDLDIRECT in the last 72 hours. Thyroid Function Tests: No results for input(s): TSH, T4TOTAL, FREET4, T3FREE, THYROIDAB in the last 72 hours. Anemia Panel: No results for input(s): VITAMINB12, FOLATE, FERRITIN, TIBC, IRON, RETICCTPCT in the last 72 hours. Urine analysis:    Component Value Date/Time   COLORURINE AMBER (A) 05/21/2018 0150   APPEARANCEUR CLEAR 05/21/2018 0150   LABSPEC 1.020 05/21/2018 0150   PHURINE 5.0 05/21/2018 0150   GLUCOSEU NEGATIVE 05/21/2018 0150   HGBUR NEGATIVE 05/21/2018 0150   BILIRUBINUR NEGATIVE 05/21/2018 0150   KETONESUR 5 (A) 05/21/2018 0150   PROTEINUR NEGATIVE 05/21/2018 0150   NITRITE NEGATIVE 05/21/2018 0150   LEUKOCYTESUR NEGATIVE 05/21/2018 0150   Sepsis Labs: @LABRCNTIP (procalcitonin:4,lacticidven:4)  ) Recent Results (from the past 240 hour(s))  Blood culture (routine x 2)     Status: None   Collection Time:  05/20/18  8:15 PM  Result Value Ref Range Status   Specimen Description BLOOD LEFT FOREARM  Final   Special Requests   Final    BOTTLES DRAWN AEROBIC AND ANAEROBIC Blood Culture results may not be optimal due to an inadequate volume of blood received in culture bottles   Culture   Final    NO GROWTH 5 DAYS Performed at [facility], [place] [address], [place], [place] [address]    Report Status 05/25/2018 FINAL  Final  Blood culture (routine x 2)     Status: None   Collection Time: 05/20/18  8:29 PM  Result Value Ref Range Status   Specimen Description BLOOD LEFT WRIST  Final   Special Requests   Final    BOTTLES DRAWN AEROBIC ONLY Blood Culture results may not be optimal due to an inadequate volume of blood received in culture bottles   Culture   Final    NO GROWTH 5 DAYS Performed at [facility], [place] [address] [place], [place] [address]    Report Status 05/25/2018 FINAL  Final  Urine Culture     Status: None   Collection Time: 05/21/18  1:50 AM  Result Value Ref Range Status   Specimen Description URINE, CATHETERIZED  Final   Special Requests NONE  Final   Culture   Final    NO GROWTH Performed at [facility], [place] [address] [place], [place] [address]    Report Status 05/22/2018 FINAL  Final      Studies: No results found.  Scheduled Meds: . amLODipine  2.5 mg Oral Daily  . feeding supplement (ENSURE ENLIVE)  237 mL Oral TID BM  . folic acid  1 mg Oral Daily  . heparin  5,000 Units Subcutaneous Q8H  . mouth rinse  15 mL Mouth Rinse BID  . multivitamin with minerals  1 tablet Oral Daily  . thiamine  100 mg Oral Daily    Continuous Infusions: . sodium chloride 100 mL/hr at 05/25/18 0300     LOS: 4 days     Desiree Hane, MD [facility] Pager [phone]  If 7PM-7AM, please contact night-coverage [email or web address] Password [facility] 05/25/2018, 8:15 AM

## 2018-05-26 NOTE — Clinical Social Work Note (Signed)
Clinical Social Work Assessment  Patient Details  Name: Sarah Gutierrez MRN: [id number] Date of Birth: [birth year]  Date of referral:  05/26/18               Reason for consult:  Discharge Planning                Permission sought to share information with:  Facility Art therapist granted to share information::     Name::        Agency::     Relationship::     Contact Information:     Housing/Transportation Living arrangements for the past 2 months:  Single Family Home Source of Information:  Siblings Patient Interpreter Needed:  None Criminal Activity/Legal Involvement Pertinent to Current Situation/Hospitalization:  No - Comment as needed Significant Relationships:  Siblings, Other(Comment), Adult Children Lives with:  Self Do you feel safe going back to the place where you live?  No(depending on progression from SNF rehab. ) Need for family participation in patient care:   Yes; Brother [name] [phone])  Care giving concerns:  CSW received consult for possible SNF placement at time of discharge. CSW spoke with brother [name]) regarding discharge plan. Brother will be assisting in choosing SNF facility for pt and agrees with PT/OT recommendations.    Social Worker assessment / plan:  CSW spoke with brother concerning discharge plan. Employment status:  Retired Forensic scientist:  Medicare PT Recommendations:  [place] / Referral to community resources:  [place]  Patient/Family's Response to care:  CSW explained to pt's brohter about SNF rehab and medical team recomedations. Pt's brother agrees with plan and await bed offers for placement.    Patient/Family's Understanding of and Emotional Response to Diagnosis, Current Treatment, and Prognosis:   Pt brother is realistic regarding theryapy needs and expressed being hopeful for improvement. Pt's brother expressed understanding of CSW role and  discharge process as well as medical condition. No questions/concerns at this time.     Emotional Assessment Appearance:  Developmentally appropriate Attitude/Demeanor/Rapport:  Unable to Assess Affect (typically observed):  Unable to Assess Orientation:  Oriented to Self Alcohol / Substance use:  Not Applicable Psych involvement (Current and /or in the community):  No (Comment)  Discharge Needs  Concerns to be addressed:  Discharge Planning Concerns Readmission within the last 30 days:  No Current discharge risk:  None Barriers to Discharge:  No Barriers Identified   [name], LCSW 05/26/2018, 10:26 AM

## 2018-05-26 NOTE — NC FL2 (Signed)
[place] LEVEL OF CARE SCREENING TOOL     IDENTIFICATION  Patient Name: Sarah Gutierrez Birthdate: 04-09-1946 Sex: female Admission Date (Current Location): 05/20/2018  [facility] and [place] Number:  Herbalist and Address:  [facility] [place], [place] [address] [place], [place] [address]      Provider Number: [id number]  Attending Physician Name and Address:  Desiree Hane, MD  Relative Name and Phone Number:  Demaris Callander [phone]    Current Level of Care: Hospital Recommended Level of Care: [place] Prior Approval Number:    Date Approved/Denied:   PASRR Number: [id number]  Discharge Plan: SNF    Current Diagnoses: Patient Active Problem List   Diagnosis Date Noted  . AKI (acute kidney injury) ([place])   . Acute metabolic encephalopathy 60/63/0160  . Dehydration 05/20/2018  . Hypercalcemia 05/20/2018  . Hypernatremia 05/20/2018  . Lactic acid acidosis 05/20/2018  . Elevated troponin 05/20/2018  . Hypertension     Orientation RESPIRATION BLADDER Height & Weight     Self  Normal (Ureathal Catheter) Weight: 95 lb 10.9 oz (43.4 kg) Height:  5\' 6"  (167.6 cm)  BEHAVIORAL SYMPTOMS/MOOD NEUROLOGICAL BOWEL NUTRITION STATUS      Continent (Soft diet thin fluids)  AMBULATORY STATUS COMMUNICATION OF NEEDS Skin   Supervision Verbally (skin dry) PU Stage 1 Dressing: (on sacrum) PU Stage 2 Dressing: (on buttocks)                   Personal Care Assistance Level of Assistance  Bathing, Dressing, Feeding Bathing Assistance: Limited assistance Feeding assistance: Independent Dressing Assistance: Limited assistance     Functional Limitations Info  Sight, Hearing, Speech Sight Info: Adequate Hearing Info: Adequate Speech Info: Adequate(responses can be delayed)    SPECIAL CARE FACTORS FREQUENCY  PT (By licensed PT), OT (By licensed OT)     PT Frequency: 2x OT Frequency: 2x            Contractures  Contractures Info: Not present    Additional Factors Info  (Full code) Code Status Info: Full code Allergies Info: none           Current Medications (05/26/2018):  This is the current hospital active medication list Current Facility-Administered Medications  Medication Dose Route Frequency Provider Last Rate Last Dose  . acetaminophen (TYLENOL) suppository 650 mg  650 mg Rectal Q6H PRN Ivor Costa, MD      . amLODipine (NORVASC) tablet 2.5 mg  2.5 mg Oral Daily Oretha Milch D, MD   2.5 mg at 05/26/18 1130  . feeding supplement (ENSURE ENLIVE) (ENSURE ENLIVE) liquid 237 mL  237 mL Oral TID BM Oretha Milch D, MD   237 mL at 05/26/18 1130  . folic acid (FOLVITE) tablet 1 mg  1 mg Oral Daily Oretha Milch D, MD   1 mg at 05/26/18 1130  . heparin injection 5,000 Units  5,000 Units Subcutaneous Q8H Ivor Costa, MD   5,000 Units at 05/26/18 0555  . hydrALAZINE (APRESOLINE) injection 5 mg  5 mg Intravenous Q2H PRN Ivor Costa, MD      . MEDLINE mouth rinse  15 mL Mouth Rinse BID Raiford Noble Latif, DO   15 mL at 05/26/18 1131  . multivitamin with minerals tablet 1 tablet  1 tablet Oral Daily Oretha Milch D, MD   1 tablet at 05/26/18 1130  . ondansetron (ZOFRAN) injection 4 mg  4 mg Intravenous Q8H PRN Ivor Costa, MD      .  sodium chloride flush (NS) 0.9 % injection 10-40 mL  10-40 mL Intracatheter PRN Oretha Milch D, MD      . thiamine (VITAMIN B-1) tablet 100 mg  100 mg Oral Daily Oretha Milch D, MD   100 mg at 05/26/18 1130     Discharge Medications: Please see discharge summary for a list of discharge medications.  Relevant Imaging Results:  Relevant Lab Results:   Additional Information [id number]  [facility], LCSW

## 2018-05-26 NOTE — Progress Notes (Signed)
PROGRESS NOTE  Sarah Gutierrez FGH:[id number] DOB: [birth year] DOA: 05/20/2018 PCP: Patient, No Pcp Per  HPI/Brief Narrative  Sarah Gutierrez is a 73 y.o. year old female with medical history significant for alcoholism, HTN who presented on 05/20/2018 after being found down at home for unknown period of time and was found to have hypothermia, and altered mental status.  Provided history by her brother Dr. [place](05/22/17) Prior to this hospitalization patient was treated for URI around Thanksgiving time and had improved since then.  At her baseline she lives alone.  She does drink quite a bit with no history of alcoholism.  Since this weekend patient has not been answering phone calls by family.  Her friend tried to come over during the same time post the door found to be locked if patient did not answer (cars and driveway).  The cops were called to evaluate and patient was found down couch, incontinent of urine unable to communicate.  Her brother reports at baseline she is able to converse and is alert and oriented x4, she has a Scientist, water quality.    In the ED, she was afebrile with normal respiratory rate and oxygen saturation.  Heart rate fluctuated between 48-81.  Blood pressure peaked at 170/80.  Lab work notable for RPR negative, HIV nonreactive  Glucose 109, CK unremarkable. CMP notable for sodium of 153, creatinine of 1.02, CO2 of 19, BUN of 43.,  Calcium 12 Initial troponin was 0.11 Lactic acid found to be 5.33 UA and urine culture was unremarkable UDS, ethanol, ammonia was unremarkable Blood cultures x2 were obtained CT abdomen and head showed no acute pathology Chest x-ray showed no active disease  She was empirically started on IV vancomycin, IV Flagyl and IV cefepime, D5 half-normal saline, IV potassium in the ED.   Subjective No new complaints, no acute events overnight  Assessment/Plan:  #Acute encephalopathy, resolved.  Oriented to self and place, conversant, no focal  deficits.   I do suspect some baseline level of poor cognitive impairment possibly related to substance abuse/alcoholism may be some underlying Wernicke's encephalopathy given patient improved with initiation of multivitamin and thiamine replacement.    #AKI, resolved Prerenal etiology in setting of dehydration.  Holding home lisinopril continue IV fluids, monitor BMP  #Lactic acidosis, resolved Peak of 5.33 with downtrend to 2.1 after IV fluid boluses.  Do not suspect infection as mentioned above with unremarkable procalcitonin and no localizing signs or symptoms of infection.  #Hypothermia, resolved.  Unclear etiology doubt infectious, thin frame contributing?  Previous to require stepdown monitoring with Bair hugger in place that is now resolved  #Acute hypernatremia, resolved In the setting of dehydration as patient was down for some unknown period of time.  Peak sodium of 155 now 136.  Continue BMP.    #Acute hypercalcemia, resolved Elevated to 12 on admission, has improved significantly with IV fluids and now resolved.  Likely elevated in the setting of dehydration as patient was down for unknown period of time.  #Mildly elevated troponin Peak troponin 0 0.11 down trended to 0.9 cardiology evaluated does not not suspect cardiac etiology given chest pain-free (still limited history), nonischemic EKG, TTE with preserved EF and no wall motion abnormalities  History of alcoholism, no longer in withdrawal. Continue oral supplementation of thiamine, folic acid, multivitamin. Monitor on CIWA for withdrawal  Hypertension, currently at goal Doing well on on low dose amlodipine 2.5 mg and titrate up as needed   Cultures:  1/7 blood cultures negative growth  to date  Telemetry: Yes  DVT prophylaxis: Heparin  consultants:  Cardiology    Procedures:  None  Antimicrobials: 05/21/2018 IV vancomycin, IV cefepime, IV Flagyl  Code Status: Full code  Family Communication: No family at  bedside, will update  Disposition Plan: Medically stable for discharge, PT recommends SNF, will update family.  Social worker assisting and working with family for SNF placement        Objective: Vitals:   05/25/18 1306 05/25/18 2204 05/26/18 0611 05/26/18 1341  BP: (!) 145/80 120/64 136/70 134/70  Pulse: 81 74 83 84  Resp: 16 16 18    Temp: (!) 97.5 F (36.4 C) 98.4 F (36.9 C) (!) 97.3 F (36.3 C) 98.4 F (36.9 C)  TempSrc: Oral Oral Oral Oral  SpO2: 100% 100% 100% 98%  Weight:      Height:        Intake/Output Summary (Last 24 hours) at 05/26/2018 1430 Last data filed at 05/26/2018 0831 Gross per 24 hour  Intake 0 ml  Output 700 ml  Net -700 ml   Filed Weights   05/20/18 2019 05/21/18 1312  Weight: 40.8 kg 43.4 kg    Exam:  Constitutional:thin, cachectic,, elderly female, no distress Eyes: EOMI, anicteric, normal conjunctivae ENMT: Moist oral mucosa Neck: FROM Cardiovascular: RRR no MRGs, with no peripheral edema Respiratory: Normal respiratory effort on room air Abdomen: Soft,non-tender,  Skin: No rash ulcers, or lesions. Without skin tenting  Neurologic:  Moving upper and lower extremities without any appreciable focal deficits.  Alert to self  but not to place or time or context. Data Reviewed: CBC: Recent Labs  Lab 05/20/18 2045 05/21/18 0753 05/22/18 0653 05/22/18 1000 05/23/18 0146  WBC 10.8* 10.3 6.4 6.6 6.5  NEUTROABS 9.7*  --  5.4 5.6  --   HGB 18.2* 15.4* 12.0 12.1 12.1  HCT 56.2* 48.2* 36.8 35.0* 35.0*  MCV 98.6 99.6 97.9 95.4 94.1  PLT 276 237 160 181 161   Basic Metabolic Panel: Recent Labs  Lab 05/21/18 0857 05/21/18 1814 05/22/18 1000 05/23/18 0146 05/24/18 0613  NA 153* 147* 139 136 139  K 3.7 4.0 3.5 3.9 4.4  CL 128* 121* 113* 110 109  CO2 19* 18* 19* 18* 22  GLUCOSE 123* 171* 133* 144* 111*  BUN 43* 33* 17 9 <5*  CREATININE 1.02* 0.96 0.77 0.75 0.64  CALCIUM 10.2 10.1 9.1 8.7* 9.4  MG 2.2  --  1.6*  --   --   PHOS  2.3*  --  1.8*  --   --    GFR: Estimated Creatinine Clearance: 43.6 mL/min (by C-G formula based on SCr of 0.64 mg/dL). Liver Function Tests: Recent Labs  Lab 05/20/18 2045 05/21/18 0857 05/22/18 1000 05/23/18 0146  AST 56* 35 31 28  ALT 75* 47* 38 37  ALKPHOS 88 58 53 56  BILITOT 1.1 1.0 0.7 0.8  PROT 8.4* 6.1* 5.1* 5.4*  ALBUMIN 4.1 2.8* 2.3* 2.5*   No results for input(s): LIPASE, AMYLASE in the last 168 hours. Recent Labs  Lab 05/20/18 2033  AMMONIA 20   Coagulation Profile: No results for input(s): INR, PROTIME in the last 168 hours. Cardiac Enzymes: Recent Labs  Lab 05/20/18 2045 05/21/18 0121 05/21/18 0753 05/21/18 0857 05/21/18 1814 05/21/18 2317  CKTOTAL 53  --   --  61  --   --   CKMB  --   --   --  7.5*  --   --   TROPONINI  --  0.11* 0.09*  --  0.07* 0.04*   BNP (last 3 results) No results for input(s): PROBNP in the last 8760 hours. HbA1C: No results for input(s): HGBA1C in the last 72 hours. CBG: Recent Labs  Lab 05/20/18 2114 05/21/18 0125 05/21/18 1309 05/21/18 1709  GLUCAP 130* 147* 109* 151*   Lipid Profile: No results for input(s): CHOL, HDL, LDLCALC, TRIG, CHOLHDL, LDLDIRECT in the last 72 hours. Thyroid Function Tests: No results for input(s): TSH, T4TOTAL, FREET4, T3FREE, THYROIDAB in the last 72 hours. Anemia Panel: No results for input(s): VITAMINB12, FOLATE, FERRITIN, TIBC, IRON, RETICCTPCT in the last 72 hours. Urine analysis:    Component Value Date/Time   COLORURINE AMBER (A) 05/21/2018 0150   APPEARANCEUR CLEAR 05/21/2018 0150   LABSPEC 1.020 05/21/2018 0150   PHURINE 5.0 05/21/2018 0150   GLUCOSEU NEGATIVE 05/21/2018 0150   HGBUR NEGATIVE 05/21/2018 0150   BILIRUBINUR NEGATIVE 05/21/2018 0150   KETONESUR 5 (A) 05/21/2018 0150   PROTEINUR NEGATIVE 05/21/2018 0150   NITRITE NEGATIVE 05/21/2018 0150   LEUKOCYTESUR NEGATIVE 05/21/2018 0150   Sepsis Labs: @LABRCNTIP (procalcitonin:4,lacticidven:4)  ) Recent Results  (from the past 240 hour(s))  Blood culture (routine x 2)     Status: None   Collection Time: 05/20/18  8:15 PM  Result Value Ref Range Status   Specimen Description BLOOD LEFT FOREARM  Final   Special Requests   Final    BOTTLES DRAWN AEROBIC AND ANAEROBIC Blood Culture results may not be optimal due to an inadequate volume of blood received in culture bottles   Culture   Final    NO GROWTH 5 DAYS Performed at [facility], [place] [address], [place], [place] [address]    Report Status 05/25/2018 FINAL  Final  Blood culture (routine x 2)     Status: None   Collection Time: 05/20/18  8:29 PM  Result Value Ref Range Status   Specimen Description BLOOD LEFT WRIST  Final   Special Requests   Final    BOTTLES DRAWN AEROBIC ONLY Blood Culture results may not be optimal due to an inadequate volume of blood received in culture bottles   Culture   Final    NO GROWTH 5 DAYS Performed at [facility], [place] [address], [place], [place] [address]    Report Status 05/25/2018 FINAL  Final  Urine Culture     Status: None   Collection Time: 05/21/18  1:50 AM  Result Value Ref Range Status   Specimen Description URINE, CATHETERIZED  Final   Special Requests NONE  Final   Culture   Final    NO GROWTH Performed at [facility], [place] [address], [place], [place] [address]    Report Status 05/22/2018 FINAL  Final      Studies: No results found.  Scheduled Meds: . amLODipine  2.5 mg Oral Daily  . feeding supplement (ENSURE ENLIVE)  237 mL Oral TID BM  . folic acid  1 mg Oral Daily  . heparin  5,000 Units Subcutaneous Q8H  . mouth rinse  15 mL Mouth Rinse BID  . multivitamin with minerals  1 tablet Oral Daily  . thiamine  100 mg Oral Daily    Continuous Infusions:    LOS: 5 days     Desiree Hane, MD [facility] Pager [phone]  If 7PM-7AM, please contact night-coverage [email or web address] Password [email or web address] 05/26/2018, 2:30 PM

## 2018-05-26 NOTE — Progress Notes (Signed)
PROGRESS NOTE  Sarah Gutierrez UVO:[id number] DOB: Sep 17, 1945 DOA: 05/20/2018 PCP: Patient, No Pcp Per  HPI/Brief Narrative  Sarah Gutierrez is a 73 y.o. year old female with medical history significant for alcoholism, HTN who presented on 05/20/2018 after being found down at home for unknown period of time and was found to have hypothermia, and altered mental status.  Provided history by her brother Dr. Morrow(05/22/17) Prior to this hospitalization patient was treated for URI around Thanksgiving time and had improved since then.  At her baseline she lives alone.  She does drink quite a bit with no history of alcoholism.  Since this weekend patient has not been answering phone calls by family.  Her friend tried to come over during the same time post the door found to be locked if patient did not answer (cars and driveway).  The cops were called to evaluate and patient was found down couch, incontinent of urine unable to communicate.  Her brother reports at baseline she is able to converse and is alert and oriented x4, she has a Scientist, water quality.    In the ED, she was afebrile with normal respiratory rate and oxygen saturation.  Heart rate fluctuated between 48-81.  Blood pressure peaked at 170/80.  Lab work notable for RPR negative, HIV nonreactive  Glucose 109, CK unremarkable. CMP notable for sodium of 153, creatinine of 1.02, CO2 of 19, BUN of 43.,  Calcium 12 Initial troponin was 0.11 Lactic acid found to be 5.33 UA and urine culture was unremarkable UDS, ethanol, ammonia was unremarkable Blood cultures x2 were obtained CT abdomen and head showed no acute pathology Chest x-ray showed no active disease  She was empirically started on IV vancomycin, IV Flagyl and IV cefepime, D5 half-normal saline, IV potassium in the ED.   Subjective No acute events overnight No new complaints.  Denies any chest pain or shortness of breath or abdominal pain.  Assessment/Plan:  #Acute encephalopathy,  resolved.  Oriented to self and place, conversant, no focal deficits.   I do suspect some baseline level of poor cognitive impairment possibly related to substance abuse/alcoholism may be some underlying Wernicke's encephalopathy given patient improved with initiation of multivitamin and thiamine replacement.    #AKI, resolved Prerenal etiology in setting of dehydration.  Holding home lisinopril continue IV fluids, monitor BMP  #Lactic acidosis, resolved Peak of 5.33 with downtrend to 2.1 after IV fluid boluses.  Do not suspect infection as mentioned above with unremarkable procalcitonin and no localizing signs or symptoms of infection.  #Hypothermia, resolved.  Unclear etiology doubt infectious, thin frame contributing?  Previous to require stepdown monitoring with Bair hugger in place that is now resolved  #Acute hypernatremia, resolved In the setting of dehydration as patient was down for some unknown period of time.  Peak sodium of 155 now 136.  Continue BMP.    #Acute hypercalcemia, resolved Elevated to 12 on admission, has improved significantly with IV fluids and now resolved.  Likely elevated in the setting of dehydration as patient was down for unknown period of time.  #Mildly elevated troponin Peak troponin 0 0.11 down trended to 0.9 cardiology evaluated does not not suspect cardiac etiology given chest pain-free (still limited history), nonischemic EKG, TTE with preserved EF and no wall motion abnormalities  History of alcoholism, no longer in withdrawal. Continue oral supplementation of thiamine, folic acid, multivitamin. Monitor on CIWA for withdrawal  Hypertension, currently at goal Doing well on on low dose amlodipine 2.5 mg and titrate  up as needed   Cultures:  1/7 blood cultures negative growth to date  Telemetry: Yes  DVT prophylaxis: Heparin  consultants:  Cardiology    Procedures:  None  Antimicrobials: 05/21/2018 IV vancomycin, IV cefepime, IV  Flagyl  Code Status: Full code  Family Communication: No family at bedside, will update  Disposition Plan: Medically stable for discharge, PT recommends SNF, will update family.        Objective: Vitals:   05/25/18 0800 05/25/18 1306 05/25/18 2204 05/26/18 0611  BP: (!) 147/82 (!) 145/80 120/64 136/70  Pulse: 68 81 74 83  Resp:  16 16 18   Temp:  (!) 97.5 F (36.4 C) 98.4 F (36.9 C) (!) 97.3 F (36.3 C)  TempSrc:  Oral Oral Oral  SpO2:  100% 100% 100%  Weight:      Height:        Intake/Output Summary (Last 24 hours) at 05/26/2018 1016 Last data filed at 05/26/2018 0831 Gross per 24 hour  Intake 0 ml  Output 950 ml  Net -950 ml   Filed Weights   05/20/18 2019 05/21/18 1312  Weight: 40.8 kg 43.4 kg    Exam:  Constitutional:thin, cachectic,, elderly female, no distress Eyes: EOMI, anicteric, normal conjunctivae ENMT: Moist oral mucosa Neck: FROM Cardiovascular: RRR no MRGs, with no peripheral edema Respiratory: Normal respiratory effort on room air Abdomen: Soft,non-tender,  Skin: No rash ulcers, or lesions. Without skin tenting  Neurologic:  Moving upper and lower extremities without any appreciable focal deficits.  Alert to self  but not to place or time or context. Data Reviewed: CBC: Recent Labs  Lab 05/20/18 2045 05/21/18 0753 05/22/18 0653 05/22/18 1000 05/23/18 0146  WBC 10.8* 10.3 6.4 6.6 6.5  NEUTROABS 9.7*  --  5.4 5.6  --   HGB 18.2* 15.4* 12.0 12.1 12.1  HCT 56.2* 48.2* 36.8 35.0* 35.0*  MCV 98.6 99.6 97.9 95.4 94.1  PLT 276 237 160 181 254   Basic Metabolic Panel: Recent Labs  Lab 05/21/18 0857 05/21/18 1814 05/22/18 1000 05/23/18 0146 05/24/18 0613  NA 153* 147* 139 136 139  K 3.7 4.0 3.5 3.9 4.4  CL 128* 121* 113* 110 109  CO2 19* 18* 19* 18* 22  GLUCOSE 123* 171* 133* 144* 111*  BUN 43* 33* 17 9 <5*  CREATININE 1.02* 0.96 0.77 0.75 0.64  CALCIUM 10.2 10.1 9.1 8.7* 9.4  MG 2.2  --  1.6*  --   --   PHOS 2.3*  --  1.8*  --    --    GFR: Estimated Creatinine Clearance: 43.6 mL/min (by C-G formula based on SCr of 0.64 mg/dL). Liver Function Tests: Recent Labs  Lab 05/20/18 2045 05/21/18 0857 05/22/18 1000 05/23/18 0146  AST 56* 35 31 28  ALT 75* 47* 38 37  ALKPHOS 88 58 53 56  BILITOT 1.1 1.0 0.7 0.8  PROT 8.4* 6.1* 5.1* 5.4*  ALBUMIN 4.1 2.8* 2.3* 2.5*   No results for input(s): LIPASE, AMYLASE in the last 168 hours. Recent Labs  Lab 05/20/18 2033  AMMONIA 20   Coagulation Profile: No results for input(s): INR, PROTIME in the last 168 hours. Cardiac Enzymes: Recent Labs  Lab 05/20/18 2045 05/21/18 0121 05/21/18 0753 05/21/18 0857 05/21/18 1814 05/21/18 2317  CKTOTAL 53  --   --  61  --   --   CKMB  --   --   --  7.5*  --   --   TROPONINI  --  0.11*  0.09*  --  0.07* 0.04*   BNP (last 3 results) No results for input(s): PROBNP in the last 8760 hours. HbA1C: No results for input(s): HGBA1C in the last 72 hours. CBG: Recent Labs  Lab 05/20/18 2114 05/21/18 0125 05/21/18 1309 05/21/18 1709  GLUCAP 130* 147* 109* 151*   Lipid Profile: No results for input(s): CHOL, HDL, LDLCALC, TRIG, CHOLHDL, LDLDIRECT in the last 72 hours. Thyroid Function Tests: No results for input(s): TSH, T4TOTAL, FREET4, T3FREE, THYROIDAB in the last 72 hours. Anemia Panel: No results for input(s): VITAMINB12, FOLATE, FERRITIN, TIBC, IRON, RETICCTPCT in the last 72 hours. Urine analysis:    Component Value Date/Time   COLORURINE AMBER (A) 05/21/2018 0150   APPEARANCEUR CLEAR 05/21/2018 0150   LABSPEC 1.020 05/21/2018 0150   PHURINE 5.0 05/21/2018 0150   GLUCOSEU NEGATIVE 05/21/2018 0150   HGBUR NEGATIVE 05/21/2018 0150   BILIRUBINUR NEGATIVE 05/21/2018 0150   KETONESUR 5 (A) 05/21/2018 0150   PROTEINUR NEGATIVE 05/21/2018 0150   NITRITE NEGATIVE 05/21/2018 0150   LEUKOCYTESUR NEGATIVE 05/21/2018 0150   Sepsis Labs: @LABRCNTIP (procalcitonin:4,lacticidven:4)  ) Recent Results (from the past 240  hour(s))  Blood culture (routine x 2)     Status: None   Collection Time: 05/20/18  8:15 PM  Result Value Ref Range Status   Specimen Description BLOOD LEFT FOREARM  Final   Special Requests   Final    BOTTLES DRAWN AEROBIC AND ANAEROBIC Blood Culture results may not be optimal due to an inadequate volume of blood received in culture bottles   Culture   Final    NO GROWTH 5 DAYS Performed at [facility], [place] [address] [place], [place] [address]    Report Status 05/25/2018 FINAL  Final  Blood culture (routine x 2)     Status: None   Collection Time: 05/20/18  8:29 PM  Result Value Ref Range Status   Specimen Description BLOOD LEFT WRIST  Final   Special Requests   Final    BOTTLES DRAWN AEROBIC ONLY Blood Culture results may not be optimal due to an inadequate volume of blood received in culture bottles   Culture   Final    NO GROWTH 5 DAYS Performed at [facility], [place] [address], [place], [place] [address]    Report Status 05/25/2018 FINAL  Final  Urine Culture     Status: None   Collection Time: 05/21/18  1:50 AM  Result Value Ref Range Status   Specimen Description URINE, CATHETERIZED  Final   Special Requests NONE  Final   Culture   Final    NO GROWTH Performed at [facility], [place] [address] [place], [place] [address]    Report Status 05/22/2018 FINAL  Final      Studies: No results found.  Scheduled Meds: . amLODipine  2.5 mg Oral Daily  . feeding supplement (ENSURE ENLIVE)  237 mL Oral TID BM  . folic acid  1 mg Oral Daily  . heparin  5,000 Units Subcutaneous Q8H  . mouth rinse  15 mL Mouth Rinse BID  . multivitamin with minerals  1 tablet Oral Daily  . thiamine  100 mg Oral Daily    Continuous Infusions:    LOS: 5 days     Desiree Hane, MD [facility] Pager [phone]  If 7PM-7AM, please contact night-coverage [email or web address] Password [facility] 05/26/2018, 10:16 AM

## 2018-05-27 LAB — VITAMIN B1: Vitamin B1 (Thiamine): 28.3 nmol/L — ABNORMAL LOW (ref 66.5–200.0)

## 2018-05-27 MED ORDER — AMLODIPINE BESYLATE 5 MG PO TABS
5.0000 mg | ORAL_TABLET | Freq: Every day | ORAL | Status: DC
  Administered 2018-05-29 – 2018-06-01 (×4): 5 mg via ORAL
  Filled 2018-05-27 (×4): qty 1

## 2018-05-27 MED ORDER — BOOST / RESOURCE BREEZE PO LIQD CUSTOM
1.0000 | Freq: Three times a day (TID) | ORAL | Status: DC
  Administered 2018-05-27 – 2018-05-31 (×9): 1 via ORAL

## 2018-05-27 MED ORDER — PRO-STAT SUGAR FREE PO LIQD
30.0000 mL | Freq: Two times a day (BID) | ORAL | Status: DC
  Administered 2018-05-27 – 2018-06-01 (×6): 30 mL via ORAL
  Filled 2018-05-27 (×6): qty 30

## 2018-05-27 MED ORDER — AMLODIPINE BESYLATE 2.5 MG PO TABS
2.5000 mg | ORAL_TABLET | Freq: Once | ORAL | Status: AC
  Administered 2018-05-27: 2.5 mg via ORAL
  Filled 2018-05-27: qty 1

## 2018-05-27 NOTE — Progress Notes (Signed)
Nutrition Follow-up  DOCUMENTATION CODES:   Severe malnutrition in context of acute illness/injury, Underweight  INTERVENTION:   -D/c Ensure Enlive po BID, each supplement provides 350 kcal and 20 grams of protein -Boost Breeze po TID, each supplement provides 250 kcal and 9 grams of protein -Continue MVI with minerals daily -30 ml Prostat BID, each supplement provides 100 kcals and 15 grams protein  NUTRITION DIAGNOSIS:   Severe Malnutrition related to acute illness(acute metabolic encephalopathy) as evidenced by energy intake < or equal to 50% for > or equal to 5 days, mild fat depletion, moderate fat depletion, moderate muscle depletion, severe muscle depletion.  Ongoing  GOAL:   Patient will meet greater than or equal to 90% of their needs  Progressing  MONITOR:   PO intake, Supplement acceptance, Labs, Weight trends, Skin, I & O's  REASON FOR ASSESSMENT:   Low Braden    ASSESSMENT:   Sarah Gutierrez is a 73 y.o. female with medical history significant of hypertension, remote alcohol abuse, who presents with altered mental status.  1/10- advanced to soft diet  Reviewed I/O's: -1.3 L x 24 hours and -4.5 L since admission  Spoke with pt at bedside, who was pleasantly confused at time. She estimates she is eating about one meal per day. Breakfast tray was untouched. Pt is mainly consuming liquids (noted pt consumed about 50% of juice and 25% of two Ensure supplements). Pt shares that she enjoys "sweet and fruity things". RD will trial Boost Breeze to promote acceptance of supplements.   Labs reviewed.   Diet Order:   Diet Order            DIET SOFT Room service appropriate? Yes; Fluid consistency: Thin  Diet effective now              EDUCATION NEEDS:   Education needs have been addressed  Skin:  Skin Assessment: Skin Integrity Issues: Skin Integrity Issues:: Stage I, Stage II Stage I: sacrum Stage II: buttocks  Last BM:  05/24/18  Height:   Ht  Readings from Last 1 Encounters:  05/21/18 5\' 6"  (1.676 m)    Weight:   Wt Readings from Last 1 Encounters:  05/21/18 43.4 kg    Ideal Body Weight:  59.1 kg  BMI:  Body mass index is 15.44 kg/m.  Estimated Nutritional Needs:   Kcal:  1500-1700  Protein:  85-100 grams  Fluid:  > 1.5 L    Raphael Fitzpatrick A. Jimmye [place], RD, LDN, CDE Pager: [phone] After hours Pager: [phone]

## 2018-05-27 NOTE — Evaluation (Signed)
Occupational Therapy Evaluation Patient Details Name: JAMAICA INTHAVONG MRN: [id number] DOB: Jun 26, 1945 Today's Date: 05/27/2018    History of Present Illness Pt is a 73 y.o. female admitted 05/20/18 after being found down at home for unknown period of time; pt with hypothermia, AKI and AMS. Head CT negative for acute abnormality. PMH includes HTN.   Clinical Impression   Pt is a 72 yo female s/p above dx. Pt PTA: pt living alone and no family present to verify information. Pt currently, lethargic and unable to state more than her name "[name]" and "I need to lay down." Pt performing bed mobility with MinA, transfers with ModA and ADL functional mobility with ModA and use of RW for safety with poor balance. Pt had no pain reported. Pt set-upA for grooming, otherwise, MinA for UB and MAxA for LB ADL. Pt would benefit from continued OT skilled services for ADL, mobility and safety in SNF setting with 24/7 supervision.     Follow Up Recommendations  SNF;Supervision/Assistance - 24 hour    Equipment Recommendations       Recommendations for Other Services       Precautions / Restrictions Precautions Precautions: Fall Restrictions Weight Bearing Restrictions: No      Mobility Bed Mobility Overal bed mobility: Needs Assistance Bed Mobility: Sidelying to Sit;Supine to Sit   Sidelying to sit: Min assist;HOB elevated;+2 for physical assistance;+2 for safety/equipment          Transfers Overall transfer level: Needs assistance Equipment used: Rolling walker (2 wheeled) Transfers: Sit to/from Stand Sit to Stand: Mod assist         General transfer comment: ModA for sequencing    Balance Overall balance assessment: Needs assistance Sitting-balance support: Bilateral upper extremity supported Sitting balance-Leahy Scale: Fair                                     ADL either performed or assessed with clinical judgement   ADL Overall ADL's : Needs  assistance/impaired Eating/Feeding: Minimal assistance;Cueing for safety;Cueing for sequencing;Sitting   Grooming: Wash/dry hands;Wash/dry face;Oral care;Applying deodorant;Brushing hair;Set up   Upper Body Bathing: Minimal assistance;Sitting   Lower Body Bathing: Moderate assistance;Sitting/lateral leans;Sit to/from stand   Upper Body Dressing : Minimal assistance   Lower Body Dressing: Moderate assistance   Toilet Transfer: Maximal assistance   Toileting- Clothing Manipulation and Hygiene: Maximal assistance;+2 for physical assistance       Functional mobility during ADLs: Moderate assistance;Rolling walker General ADL Comments: Pt requires continued sequencing verbal cues to complete task. pt unable to perform tasks without max cues     Vision Baseline Vision/History: No visual deficits Vision Assessment?: Vision impaired- to be further tested in functional context     Perception     Praxis      Pertinent Vitals/Pain Pain Assessment: Faces Faces Pain Scale: Hurts a little bit     Hand Dominance Right   Extremity/Trunk Assessment Upper Extremity Assessment Upper Extremity Assessment: Generalized weakness   Lower Extremity Assessment Lower Extremity Assessment: Generalized weakness   Cervical / Trunk Assessment Cervical / Trunk Assessment: Kyphotic   Communication Communication Communication: No difficulties   Cognition Arousal/Alertness: Lethargic;Suspect due to medications Behavior During Therapy: Flat affect Overall Cognitive Status: No family/caregiver present to determine baseline cognitive functioning Area of Impairment: Orientation;Attention;Memory;Following commands;Safety/judgement;Awareness;Problem solving                 Orientation Level: Disoriented  to;Person;Place;Time;Situation Current Attention Level: Focused Memory: Decreased short-term memory Following Commands: Follows one step commands inconsistently;Follows one step commands with  increased time Safety/Judgement: Decreased awareness of safety;Decreased awareness of deficits   Problem Solving: Slow processing;Decreased initiation;Difficulty sequencing;Requires verbal cues General Comments: responds to [name] and refers to self as [name]. Pt unable to provide birthday or other orientation/PLOF questions   General Comments       Exercises     Shoulder Instructions      Home Living Family/patient expects to be discharged to:: Skilled nursing facility Living Arrangements: Alone                               Additional Comments: Pt is a poor historian at this time and very lethargic. Unable to provide info and no family present      Prior Functioning/Environment Level of Independence: Needs assistance  Gait / Transfers Assistance Needed: RW  ADL's / Homemaking Assistance Needed: Requires MaxA at this time; set-upA for grooming with sequencing   Comments: Lives alone        OT Problem List: Decreased strength;Decreased cognition      OT Treatment/Interventions: Self-care/ADL training;Therapeutic exercise;Energy conservation;Therapeutic activities;Balance training    OT Goals(Current goals can be found in the care plan section) Acute Rehab OT Goals Patient Stated Goal: Lay back down OT Goal Formulation: With patient Time For Goal Achievement: 06/10/18 Potential to Achieve Goals: Fair ADL Goals Pt Will Perform Upper Body Dressing: with min guard assist Pt Will Perform Lower Body Dressing: with min assist Pt Will Transfer to Toilet: with min assist Pt Will Perform Toileting - Clothing Manipulation and hygiene: with min assist;sitting/lateral leans;sit to/from stand Additional ADL Goal #1: Pt will perform ADL functional transfers with minguardA  OT Frequency: Min 2X/week   Barriers to D/C:            Co-evaluation              AM-PAC OT "6 Clicks" Daily Activity     Outcome Measure Help from another person eating meals?: A  Little Help from another person taking care of personal grooming?: A Little Help from another person toileting, which includes using toliet, bedpan, or urinal?: A Lot Help from another person bathing (including washing, rinsing, drying)?: A Lot Help from another person to put on and taking off regular upper body clothing?: A Little Help from another person to put on and taking off regular lower body clothing?: A Lot 6 Click Score: 15   End of Session Equipment Utilized During Treatment: Gait belt;Rolling walker Nurse Communication: Mobility status  Activity Tolerance: Patient tolerated treatment well;Patient limited by pain Patient left: in chair;with call bell/phone within reach;with chair alarm set  OT Visit Diagnosis: Unsteadiness on feet (R26.81);Muscle weakness (generalized) (M62.81)                Time: 1026-1100 OT Time Calculation (min): 34 min Charges:  OT General Charges $OT Visit: 1 Visit OT Evaluation $OT Eval Moderate Complexity: 1 Mod OT Treatments $Self Care/Home Management : 8-22 mins  Ebony Hail Harold Hedge) Marsa Aris OTR/L Acute Rehabilitation Services Pager: [phone] Office: [phone]   Fredda Hammed 05/27/2018, 1:50 PM

## 2018-05-27 NOTE — Social Work (Signed)
HIPPA compliant message left for pt brother Dr. Harvel Quale, await return call to discuss offers.  Westley Hummer, MSW, [place] Work [phone]

## 2018-05-27 NOTE — Progress Notes (Signed)
PROGRESS NOTE  Sarah Gutierrez:[id number] DOB: January 29, 1946 DOA: 05/20/2018 PCP: Patient, No Pcp Per  HPI/Brief Narrative  Sarah Gutierrez is a 73 y.o. year old female with medical history significant for alcoholism, HTN who presented on 05/20/2018 after being found down at home for unknown period of time and was found to have hypothermia, and altered mental status.  Provided history by her brother Dr. Morrow(05/22/17) Prior to this hospitalization patient was treated for URI around Thanksgiving time and had improved since then.  At her baseline she lives alone.  She does drink quite a bit with no history of alcoholism.  Since this weekend patient has not been answering phone calls by family.  Her friend tried to come over during the same time post the door found to be locked if patient did not answer (cars and driveway).  The cops were called to evaluate and patient was found down couch, incontinent of urine unable to communicate.  Her brother reports at baseline she is able to converse and is alert and oriented x4, she has a Scientist, water quality.    In the ED, she was afebrile with normal respiratory rate and oxygen saturation.  Heart rate fluctuated between 48-81.  Blood pressure peaked at 170/80.  Lab work notable for RPR negative, HIV nonreactive  Glucose 109, CK unremarkable. CMP notable for sodium of 153, creatinine of 1.02, CO2 of 19, BUN of 43.,  Calcium 12 Initial troponin was 0.11 Lactic acid found to be 5.33 UA and urine culture was unremarkable UDS, ethanol, ammonia was unremarkable Blood cultures x2 were obtained CT abdomen and head showed no acute pathology Chest x-ray showed no active disease  She was empirically started on IV vancomycin, IV Flagyl and IV cefepime, D5 half-normal saline, IV potassium in the ED.   Subjective No new complaints, no acute events overnight  Assessment/Plan:  #Acute encephalopathy, resolved.  Oriented to self , conversant, no focal deficits.   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    #AKI, resolved Prerenal etiology in setting of dehydration.  Holding home lisinopril continue IV fluids, monitor BMP  #Lactic acidosis, resolved Peak of 5.33 with downtrend to 2.1 after IV fluid boluses.  Do not suspect infection as mentioned above with unremarkable procalcitonin and no localizing signs or symptoms of infection.  #Hypothermia, resolved.  Unclear etiology doubt infectious, thin frame contributing?  Previous to require stepdown monitoring with Bair hugger in place that is now resolved  #Acute hypernatremia, resolved In the setting of dehydration as patient was down for some unknown period of time.  Peak sodium of 155 now 136.  Continue BMP.    #Acute hypercalcemia, resolved Elevated to 12 on admission, has improved significantly with IV fluids and now resolved.  Likely elevated in the setting of dehydration as patient was down for unknown period of time.  #Mildly elevated troponin Peak troponin 0 0.11 down trended to 0.9 cardiology evaluated does not not suspect cardiac etiology given chest pain-free (still limited history), nonischemic EKG, TTE with preserved EF and no wall motion abnormalities  History of alcoholism, no longer in withdrawal. Continue oral supplementation of thiamine, folic acid, multivitamin. Monitor on CIWA for withdrawal  Hypertension, slightly elevated in the 150s We will increase amlodipine 5 mg, continue to titrate up as needed.    Cultures:  1/7 blood cultures negative growth to date  Telemetry: Yes  DVT prophylaxis: Heparin  consultants:  Cardiology    Procedures:  None  Antimicrobials: 05/21/2018 IV vancomycin, IV cefepime, IV Flagyl  Code Status:  Full code  Family Communication: No family at bedside, will update  Disposition Plan: Medically stable for discharge, PT recommends SNF, will update family.  Social worker assisting and working with family for SNF placement        Objective: Vitals:   05/26/18 2118 05/27/18 0622 05/27/18 1024 05/27/18 1403  BP: (!) 147/80 (!) 159/78 (!) 154/90 (!) 148/80  Pulse: 88 91  97  Resp: 16 17    Temp: 98.7 F (37.1 C) 98.2 F (36.8 C)  98.1 F (36.7 C)  TempSrc: Oral Oral  Oral  SpO2: 100% 100%  100%  Weight:      Height:        Intake/Output Summary (Last 24 hours) at 05/27/2018 1431 Last data filed at 05/27/2018 0854 Gross per 24 hour  Intake 170 ml  Output 1400 ml  Net -1230 ml   Filed Weights   05/20/18 2019 05/21/18 1312  Weight: 40.8 kg 43.4 kg    Exam:  Constitutional:thin, cachectic,, elderly female, no distress Eyes: EOMI, anicteric, normal conjunctivae ENMT: Moist oral mucosa Neck: FROM Cardiovascular: RRR no MRGs, with no peripheral edema Respiratory: Normal respiratory effort on room air Abdomen: Soft,non-tender,  Skin: No rash ulcers, or lesions. Without skin tenting  Neurologic:  Moving upper and lower extremities without any appreciable focal deficits.  Alert to self  but not to place or time or context. Data Reviewed: CBC: Recent Labs  Lab 05/20/18 2045 05/21/18 0753 05/22/18 0653 05/22/18 1000 05/23/18 0146  WBC 10.8* 10.3 6.4 6.6 6.5  NEUTROABS 9.7*  --  5.4 5.6  --   HGB 18.2* 15.4* 12.0 12.1 12.1  HCT 56.2* 48.2* 36.8 35.0* 35.0*  MCV 98.6 99.6 97.9 95.4 94.1  PLT 276 237 160 181 619   Basic Metabolic Panel: Recent Labs  Lab 05/21/18 0857 05/21/18 1814 05/22/18 1000 05/23/18 0146 05/24/18 0613  NA 153* 147* 139 136 139  K 3.7 4.0 3.5 3.9 4.4  CL 128* 121* 113* 110 109  CO2 19* 18* 19* 18* 22  GLUCOSE 123* 171* 133* 144* 111*  BUN 43* 33* 17 9 <5*  CREATININE 1.02* 0.96 0.77 0.75 0.64  CALCIUM 10.2 10.1 9.1 8.7* 9.4  MG 2.2   --  1.6*  --   --   PHOS 2.3*  --  1.8*  --   --    GFR: Estimated Creatinine Clearance: 43.6 mL/min (by C-G formula based on SCr of 0.64 mg/dL). Liver Function Tests: Recent Labs  Lab 05/20/18 2045 05/21/18 0857 05/22/18 1000 05/23/18 0146  AST 56* 35 31 28  ALT 75* 47* 38 37  ALKPHOS 88 58 53 56  BILITOT 1.1 1.0 0.7 0.8  PROT 8.4* 6.1* 5.1* 5.4*  ALBUMIN 4.1 2.8* 2.3* 2.5*   No results for input(s): LIPASE, AMYLASE in the last 168 hours. Recent Labs  Lab 05/20/18 2033  AMMONIA 20   Coagulation Profile: No results for input(s): INR, PROTIME in the last 168 hours. Cardiac Enzymes: Recent Labs  Lab 05/20/18 2045 05/21/18 0121 05/21/18 0753 05/21/18 0857 05/21/18 1814 05/21/18 2317  CKTOTAL 53  --   --  61  --   --   CKMB  --   --   --  7.5*  --   --   TROPONINI  --  0.11* 0.09*  --  0.07* 0.04*   BNP (last 3 results) No results for input(s): PROBNP in the last 8760 hours. HbA1C: No results for input(s): HGBA1C in the last 72 hours. CBG: Recent Labs  Lab 05/20/18 2114 05/21/18 0125 05/21/18 1309 05/21/18 1709  GLUCAP 130* 147* 109* 151*   Lipid Profile: No results for input(s): CHOL, HDL, LDLCALC, TRIG, CHOLHDL, LDLDIRECT in the last 72 hours. Thyroid Function Tests: No results for input(s): TSH, T4TOTAL, FREET4, T3FREE, THYROIDAB in the last 72 hours. Anemia Panel: No results for input(s): VITAMINB12, FOLATE, FERRITIN, TIBC, IRON, RETICCTPCT in the last 72 hours. Urine analysis:    Component Value Date/Time   COLORURINE AMBER (A) 05/21/2018 0150   APPEARANCEUR CLEAR 05/21/2018 0150   LABSPEC 1.020 05/21/2018 0150   PHURINE 5.0 05/21/2018 0150   GLUCOSEU NEGATIVE 05/21/2018 0150   HGBUR NEGATIVE 05/21/2018 0150   BILIRUBINUR NEGATIVE 05/21/2018 0150   KETONESUR 5 (A) 05/21/2018 0150   PROTEINUR NEGATIVE 05/21/2018 0150   NITRITE NEGATIVE 05/21/2018 0150   LEUKOCYTESUR NEGATIVE 05/21/2018 0150   Sepsis  Labs: @LABRCNTIP (procalcitonin:4,lacticidven:4)  ) Recent Results (from the past 240 hour(s))  Blood culture (routine x 2)     Status: None   Collection Time: 05/20/18  8:15 PM  Result Value Ref Range Status   Specimen Description BLOOD LEFT FOREARM  Final   Special Requests   Final    BOTTLES DRAWN AEROBIC AND ANAEROBIC Blood Culture results may not be optimal due to an inadequate volume of blood received in culture bottles   Culture   Final    NO GROWTH 5 DAYS Performed at [facility], [place] [address], [place], [place] [address]    Report Status 05/25/2018 FINAL  Final  Blood culture (routine x 2)     Status: None   Collection Time: 05/20/18  8:29 PM  Result Value Ref Range Status   Specimen Description BLOOD LEFT WRIST  Final   Special Requests   Final    BOTTLES DRAWN AEROBIC ONLY Blood Culture results may not be optimal due to an inadequate volume of blood received in culture bottles   Culture   Final    NO GROWTH 5 DAYS Performed at [facility], [place] [address] [place], [place] [address]    Report Status 05/25/2018 FINAL  Final  Urine Culture     Status: None   Collection Time: 05/21/18  1:50 AM  Result Value Ref Range Status   Specimen Description URINE, CATHETERIZED  Final   Special Requests NONE  Final   Culture   Final    NO GROWTH Performed at [facility], [place] [address] [place], [place] [address]    Report Status 05/22/2018 FINAL  Final      Studies: No results found.  Scheduled Meds: . amLODipine  2.5 mg Oral Daily  . feeding supplement  1 Container Oral TID BM  . feeding supplement (PRO-STAT SUGAR FREE 64)  30 mL Oral BID  . folic acid  1 mg Oral Daily  . heparin  5,000 Units Subcutaneous Q8H  . mouth rinse  15 mL Mouth Rinse BID  . multivitamin with minerals  1 tablet Oral Daily  . thiamine  100 mg Oral Daily    Continuous Infusions:    LOS: 6 days     Desiree Hane, MD [facility] Pager [phone]  If  7PM-7AM, please contact night-coverage [email or web address] Password [facility] 05/27/2018, 2:31 PM

## 2018-05-28 LAB — PTH-RELATED PEPTIDE: PTH-related peptide: <2 pmol/L

## 2018-05-28 MED ORDER — SODIUM CHLORIDE 0.9 % IV SOLN
INTRAVENOUS | Status: DC
  Administered 2018-05-28: 07:00:00 via INTRAVENOUS

## 2018-05-28 NOTE — Progress Notes (Signed)
Occupational Therapy Treatment Patient Details Name: Sarah Gutierrez MRN: [id number] DOB: 1945-12-13 Today's Date: 05/28/2018    History of present illness Pt is a 73 y.o. female admitted 05/20/18 after being found down at home for unknown period of time; pt with hypothermia, AKI and AMS. Head CT negative for acute abnormality. PMH includes HTN.   OT comments  Pt continues to be confused and talking out of her head not particularly staying on topic. Pt performing grooming tasks seated in recliner as PTA had just finished their session. Pt requiring less verbal cues today to perform task, but pt continues to be limited by fatigue, lethargy and weakness. Pt minA to [place] for sit to stands with posterior lean and using back of chair to rest during sit to stands x4 with RW in front. Overall pt modA +2 for mobility due to poor safety awareness and posterior lean; and  minA for grooming with maxA overall for ADLs. Pt requiring cues prior to each movement to perform task. Pt would benefit from continued OT skilled services for ADL, mobility and safety in SNF setting with 24/7 [place]. Pt progressing.   Follow Up Recommendations  SNF;Supervision/Assistance - 24 hour    Equipment Recommendations       Recommendations for Other Services      Precautions / Restrictions Precautions Precautions: Fall Restrictions Weight Bearing Restrictions: No       Mobility Bed Mobility Overal bed mobility: Needs Assistance             General bed mobility comments: in recliner upon arrival  Transfers Overall transfer level: Needs assistance Equipment used: Rolling walker (2 wheeled) Transfers: Sit to/from Stand Sit to Stand: Min assist;From elevated surface         General transfer comment: ModA for sequencing    Balance Overall balance assessment: Needs assistance Sitting-balance support: Bilateral upper extremity supported Sitting balance-Leahy Scale: Fair Sitting balance - Comments:  requires assist to sit upright and pull self to sitting edge of chair     Standing balance-Leahy Scale: Poor                             ADL either performed or assessed with clinical judgement   ADL Overall ADL's : Needs assistance/impaired Eating/Feeding: Set up   Grooming: Wash/dry hands;Wash/dry face;Applying deodorant;Oral care;Set up;Sitting                               Functional mobility during ADLs: Moderate assistance;Rolling walker;+2 for physical assistance;+2 for safety/equipment General ADL Comments: Pt requires continued sequencing verbal cues to complete task with increased independence today     Vision       Perception     Praxis      Cognition Arousal/Alertness: Awake/alert Behavior During Therapy: Flat affect Overall Cognitive Status: Impaired/Different from baseline Area of Impairment: Memory;Awareness;Safety/judgement                 Orientation Level: Disoriented to;Place;Situation     Following Commands: Follows one step commands with increased time Safety/Judgement: Decreased awareness of safety;Decreased awareness of deficits   Problem Solving: Slow processing          Exercises     Shoulder Instructions       General Comments      Pertinent Vitals/ Pain       Pain Assessment: No/denies pain  Home Living  Prior Functioning/Environment              Frequency  Min 2X/week        Progress Toward Goals  OT Goals(current goals can now be found in the care plan section)  Progress towards OT goals: Progressing toward goals  Acute Rehab OT Goals Patient Stated Goal: Lay back down OT Goal Formulation: With patient Time For Goal Achievement: 06/10/18 Potential to Achieve Goals: Fair ADL Goals Pt Will Perform Upper Body Dressing: with min guard assist Pt Will Perform Lower Body Dressing: with min assist Pt Will Transfer to Toilet:  with min assist Pt Will Perform Toileting - Clothing Manipulation and hygiene: with min assist;sitting/lateral leans;sit to/from stand Additional ADL Goal #1: Pt will perform ADL functional transfers with minguardA  Plan Discharge plan remains appropriate    Co-evaluation                 AM-PAC OT "6 Clicks" Daily Activity     Outcome Measure   Help from another person eating meals?: A Little Help from another person taking care of personal grooming?: A Little Help from another person toileting, which includes using toliet, bedpan, or urinal?: A Lot Help from another person bathing (including washing, rinsing, drying)?: A Lot Help from another person to put on and taking off regular upper body clothing?: A Little Help from another person to put on and taking off regular lower body clothing?: A Lot 6 Click Score: 15    End of Session Equipment Utilized During Treatment: Gait belt;Rolling walker  OT Visit Diagnosis: Unsteadiness on feet (R26.81);Muscle weakness (generalized) (M62.81)   Activity Tolerance Patient tolerated treatment well;Patient limited by pain   Patient Left in chair;with call bell/phone within reach;with chair alarm set   Nurse Communication Mobility status        Time: 1610-9604 OT Time Calculation (min): 26 min  Charges: OT General Charges $OT Visit: 1 Visit OT Treatments $Self Care/Home Management : 23-37 mins  Ebony Hail Harold Hedge) Marsa Aris OTR/L Acute Rehabilitation Services Pager: [phone] Office: [phone]   Fredda Hammed 05/28/2018, 2:56 PM

## 2018-05-28 NOTE — Social Work (Signed)
CSW spoke with pt brother Harvel Quale, discussed SNF placement, provided CMS ratings packet and offers electronically him at [email or web address]  The message was as follows: Hi Dr. Orland Mustard,  Attached are the ratings and more information for the following facilities that have offered: [facility] [place]  More detailed information can be found using this tool: http://church.com/  You can also learn more about facility ratings here: Momentum[employer].pl  Let me know if you have any further questions! Thank you, Michiel Cowboy  [phone]"  CSW continuing to follow for support with disposition. He is aware pt medically stable await facility choice  Westley Hummer, MSW, [place] Work [phone]

## 2018-05-28 NOTE — Progress Notes (Signed)
PROGRESS NOTE  Sarah Gutierrez RXV:[id number] DOB: [birth year] DOA: 05/20/2018 PCP: Patient, No Pcp Per  HPI/Brief Narrative  Sarah Gutierrez is a 73 y.o. year old female with medical history significant for alcoholism, HTN who presented on 05/20/2018 after being found down at home for unknown period of time and was found to have hypothermia, and altered mental status. History provided by her brother, Dr. Orland Mustard (05/22/2017).) Prior to this hospitalization patient was treated for URI around Thanksgiving time and had improved since then.  At her baseline she lives alone.  She does drink quite a bit with no history of alcoholism.  Since this weekend patient has not been answering phone calls by family.  Her friend tried to come over during the same time post the door found to be locked if patient did not answer (cars and driveway).  The cops were called to evaluate and patient was found down couch, incontinent of urine unable to communicate.  Her brother reports at baseline she is able to converse and is alert and oriented x4 with intermittent episodes of confusion and difficulty communicating with clear syntax, she has a masters degree.  Though he does admit that she had recently started drinking again after years of sobriety.  In the ED, she was found to have a calcium of 12, troponin of 0.11, sodium 155 lactic acid of 5.33.  Cardiology evaluated patient given elevated troponin and deemed this is more likely demand ischemia given no ischemic EKG changes or abnormalities on TTE.  With IV fluids lactic acidosis resolved.  Infectious work-up was negative for UA, urine culture and blood cultures, empiric antibiotics started in the ED were discontinued.  For acute metabolic encephalopathy UDS, ethanol, ammonia were unremarkable.  With initiation of IV fluids, folate, thiamine and monitored under CIWA protocol patient became less drowsy/lethargic and more alert and oriented to self x1.   Subjective No new  complaints, no acute events overnight  Assessment/Plan:  #Acute encephalopathy, resolved.  Oriented to self only, conversant but poor syntax with language and seems to be confabulating in conversation, no focal deficits.   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    #AKI, resolved Prerenal etiology in setting of dehydration.  Holding home lisinopril continue IV fluids, monitor BMP  #Lactic acidosis, resolved Peak of 5.33 with downtrend to 2.1 after IV fluid boluses.  Do not suspect infection as mentioned above with unremarkable procalcitonin and no localizing signs or symptoms of infection.  #Hypothermia, resolved.  Unclear etiology doubt infectious, thin frame contributing?  Previous to require stepdown monitoring with Bair hugger in place that is now resolved  #Acute hypernatremia, resolved In the setting of dehydration as patient was down for some unknown period of time.  Peak sodium of 155 now resolved.  Continue BMP.    #Acute hypercalcemia, resolved Elevated to 12 on admission, has improved significantly with IV fluids and now resolved.  Likely elevated in the setting of dehydration as patient was down for unknown period of time.  #Mildly elevated troponin Peak troponin 0 0.11 down trended to 0.9 cardiology evaluated does not not suspect cardiac etiology given chest pain-free (still limited history), nonischemic EKG, TTE with preserved EF and no wall motion abnormalities  History of alcoholism, no longer in withdrawal. Continue oral supplementation of thiamine, folic acid, multivitamin. Monitor  on CIWA for withdrawal  Hypertension, slightly elevated in the 150s We will increase amlodipine 5 mg, continue to titrate up as  needed.  Acute urinary retention Foley catheter was discontinued on 1/13.  Scheduled bladder scans, in and out catheterization for PVR greater than 250 cc.  May need Foley placement/voiding trial if continues to retain.    Cultures:  1/7 blood cultures negative growth to date  Telemetry: Yes  DVT prophylaxis: Heparin  consultants:  Cardiology    Procedures:  None  Antimicrobials: 05/21/2018 IV vancomycin, IV cefepime, IV Flagyl  Code Status: Full code  Family Communication: Brother updated by phone on 05/27/2018  Disposition Plan: Medically stable for discharge, PT recommends SNF, will update family.  Social worker assisting and working with family for SNF placement        Objective: Vitals:   05/27/18 1403 05/27/18 2152 05/28/18 0525 05/28/18 1408  BP: (!) 148/80 (!) 141/71 138/68 (!) 143/67  Pulse: 97 90 92 86  Resp:  17 17   Temp: 98.1 F (36.7 C) (!) 97.4 F (36.3 C) 97.7 F (36.5 C) 97.7 F (36.5 C)  TempSrc: Oral Oral Oral Oral  SpO2: 100% 100% 100% 100%  Weight:      Height:        Intake/Output Summary (Last 24 hours) at 05/28/2018 1615 Last data filed at 05/28/2018 1300 Gross per 24 hour  Intake 480 ml  Output 650 ml  Net -170 ml   Filed Weights   05/20/18 2019 05/21/18 1312  Weight: 40.8 kg 43.4 kg    Exam:  Constitutional:thin, cachectic, elderly female, no distress Eyes: EOMI, anicteric, normal conjunctivae ENMT: Moist oral mucosa Neck: FROM Cardiovascular: RRR no MRGs, with no peripheral edema Respiratory: Normal respiratory effort on room air Abdomen: Soft,non-tender,  Skin: No rash ulcers, or lesions. Without skin tenting  Neurologic:  Moving upper and lower extremities without any appreciable focal deficits.  Alert to self  but not to place or time or context. Data Reviewed: CBC: Recent Labs  Lab 05/22/18 0653 05/22/18 1000 05/23/18 0146  WBC 6.4 6.6 6.5  NEUTROABS 5.4 5.6  --   HGB 12.0 12.1 12.1  HCT 36.8 35.0* 35.0*   MCV 97.9 95.4 94.1  PLT 160 181 440   Basic Metabolic Panel: Recent Labs  Lab 05/21/18 1814 05/22/18 1000 05/23/18 0146 05/24/18 0613  NA 147* 139 136 139  K 4.0 3.5 3.9 4.4  CL 121* 113* 110 109  CO2 18* 19* 18* 22  GLUCOSE 171* 133* 144* 111*  BUN 33* 17 9 <5*  CREATININE 0.96 0.77 0.75 0.64  CALCIUM 10.1 9.1 8.7* 9.4  MG  --  1.6*  --   --   PHOS  --  1.8*  --   --    GFR: Estimated Creatinine Clearance: 43.6 mL/min (by C-G formula based on SCr of 0.64 mg/dL). Liver Function Tests: Recent Labs  Lab 05/22/18 1000 05/23/18 0146  AST 31 28  ALT 38 37  ALKPHOS 53 56  BILITOT 0.7 0.8  PROT 5.1* 5.4*  ALBUMIN 2.3* 2.5*   No results for input(s): LIPASE, AMYLASE in the last 168 hours. No results for input(s): AMMONIA in the last 168 hours. Coagulation Profile: No results for input(s): INR, PROTIME in the last 168 hours. Cardiac Enzymes: Recent Labs  Lab 05/21/18 1814 05/21/18 2317  TROPONINI 0.07* 0.04*   BNP (last 3 results) No results for input(s): PROBNP in the last 8760 hours. HbA1C: No results for input(s): HGBA1C  in the last 72 hours. CBG: Recent Labs  Lab 05/21/18 1709  GLUCAP 151*   Lipid Profile: No results for input(s): CHOL, HDL, LDLCALC, TRIG, CHOLHDL, LDLDIRECT in the last 72 hours. Thyroid Function Tests: No results for input(s): TSH, T4TOTAL, FREET4, T3FREE, THYROIDAB in the last 72 hours. Anemia Panel: No results for input(s): VITAMINB12, FOLATE, FERRITIN, TIBC, IRON, RETICCTPCT in the last 72 hours. Urine analysis:    Component Value Date/Time   COLORURINE AMBER (A) 05/21/2018 0150   APPEARANCEUR CLEAR 05/21/2018 0150   LABSPEC 1.020 05/21/2018 0150   PHURINE 5.0 05/21/2018 0150   GLUCOSEU NEGATIVE 05/21/2018 0150   HGBUR NEGATIVE 05/21/2018 0150   BILIRUBINUR NEGATIVE 05/21/2018 0150   KETONESUR 5 (A) 05/21/2018 0150   PROTEINUR NEGATIVE 05/21/2018 0150   NITRITE NEGATIVE 05/21/2018 0150   LEUKOCYTESUR NEGATIVE 05/21/2018 0150    Sepsis Labs: @LABRCNTIP (procalcitonin:4,lacticidven:4)  ) Recent Results (from the past 240 hour(s))  Blood culture (routine x 2)     Status: None   Collection Time: 05/20/18  8:15 PM  Result Value Ref Range Status   Specimen Description BLOOD LEFT FOREARM  Final   Special Requests   Final    BOTTLES DRAWN AEROBIC AND ANAEROBIC Blood Culture results may not be optimal due to an inadequate volume of blood received in culture bottles   Culture   Final    NO GROWTH 5 DAYS Performed at [facility], [place] [address], [place], [place] [address]    Report Status 05/25/2018 FINAL  Final  Blood culture (routine x 2)     Status: None   Collection Time: 05/20/18  8:29 PM  Result Value Ref Range Status   Specimen Description BLOOD LEFT WRIST  Final   Special Requests   Final    BOTTLES DRAWN AEROBIC ONLY Blood Culture results may not be optimal due to an inadequate volume of blood received in culture bottles   Culture   Final    NO GROWTH 5 DAYS Performed at [facility], [place] [address], [place], [place] [address]    Report Status 05/25/2018 FINAL  Final  Urine Culture     Status: None   Collection Time: 05/21/18  1:50 AM  Result Value Ref Range Status   Specimen Description URINE, CATHETERIZED  Final   Special Requests NONE  Final   Culture   Final    NO GROWTH Performed at [facility], [place] [address] [place], [place] [address]    Report Status 05/22/2018 FINAL  Final      Studies: No results found.  Scheduled Meds: . amLODipine  5 mg Oral Daily  . feeding supplement  1 Container Oral TID BM  . feeding supplement (PRO-STAT SUGAR FREE 64)  30 mL Oral BID  . folic acid  1 mg Oral Daily  . heparin  5,000 Units Subcutaneous Q8H  . mouth rinse  15 mL Mouth Rinse BID  . multivitamin with minerals  1 tablet Oral Daily  . thiamine  100 mg Oral Daily    Continuous Infusions:    LOS: 7 days     Desiree Hane, MD [facility] Pager  [phone]  If 7PM-7AM, please contact night-coverage [email or web address] Password [facility] 05/28/2018, 4:15 PM

## 2018-05-28 NOTE — Progress Notes (Signed)
Physical Therapy Treatment Patient Details Name: Sarah Gutierrez MRN: [id number] DOB: November 20, 1945 Today's Date: 05/28/2018    History of Present Illness Pt is a 73 y.o. female admitted 05/20/18 after being found down at home for unknown period of time; pt with hypothermia, AKI and AMS. Head CT negative for acute abnormality. PMH includes HTN.    PT Comments    Patient seen for mobility progression. Pt is oriented to self only. Pt requires max A for gait training due to heavy posterior bias and +2 for safety due to pt attempting to sit multiple times while ambulating.  Continue to progress as tolerated with anticipated d/c to SNF for further skilled PT services.    Follow Up Recommendations  SNF;Supervision for mobility/OOB     Equipment Recommendations  (TBD)    Recommendations for Other Services       Precautions / Restrictions Precautions Precautions: Fall Restrictions Weight Bearing Restrictions: No    Mobility  Bed Mobility Overal bed mobility: Needs Assistance             General bed mobility comments: in recliner upon arrival and sliding toward end of chair with no awareness of needing to reposition  Transfers Overall transfer level: Needs assistance Equipment used: Rolling walker (2 wheeled) Transfers: Sit to/from Stand Sit to Stand: Min assist;Max assist         General transfer comment: assist to power up into standing and increased assistance to maintain balance due to heavy posterior bias; pt stood X 3 from recliner and requires max encouragement not to sit back down  Ambulation/Gait Ambulation/Gait assistance: Max assist Gait Distance (Feet): 20 Feet Assistive device: Rolling walker (2 wheeled) Gait Pattern/deviations: Step-through pattern;Decreased stride length;Leaning posteriorly Gait velocity: slow   General Gait Details: max encouragement to stay standing and to ambulate; pt attempting to sit down several times; heavy posterior lean and  assistance required to guide RW    Stairs             Wheelchair Mobility    Modified Rankin (Stroke Patients Only)       Balance Overall balance assessment: Needs assistance Sitting-balance support: Bilateral upper extremity supported Sitting balance-Leahy Scale: Fair Sitting balance - Comments: requires assist to sit upright and pull self to sitting edge of chair     Standing balance-Leahy Scale: Poor( to zero)                              Cognition Arousal/Alertness: Awake/alert Behavior During Therapy: WFL for tasks assessed/performed Overall Cognitive Status: Impaired/Different from baseline Area of Impairment: Memory;Awareness;Safety/judgement;Orientation;Following commands                 Orientation Level: Disoriented to;Place;Situation;Time Current Attention Level: Focused Memory: Decreased short-term memory Following Commands: Follows one step commands with increased time Safety/Judgement: Decreased awareness of safety;Decreased awareness of deficits Awareness: Intellectual Problem Solving: Slow processing;Requires verbal cues General Comments: pt likes to go by [name] but did not divulge this information. when asked if she goes by [name] she said yes. Pt oriented to self only and reoriented to time, place, situation. pt unable to recall place, time, situation < 5 mins later; pt repeats "I need to sit down" anytime she stands but unable to state why      Exercises      General Comments        Pertinent Vitals/Pain Pain Assessment: No/denies pain    Home Living  Prior Function            PT Goals (current goals can now be found in the care plan section) Acute Rehab PT Goals Patient Stated Goal: Lay back down Progress towards PT goals: Progressing toward goals    Frequency    Min 2X/week      PT Plan Current plan remains appropriate    Co-evaluation              AM-PAC PT "6  Clicks" Mobility   Outcome Measure  Help needed turning from your back to your side while in a flat bed without using bedrails?: None Help needed moving from lying on your back to sitting on the side of a flat bed without using bedrails?: None Help needed moving to and from a bed to a chair (including a wheelchair)?: A Lot Help needed standing up from a chair using your arms (e.g., wheelchair or bedside chair)?: A Lot Help needed to walk in hospital room?: A Lot Help needed climbing 3-5 steps with a railing? : Total 6 Click Score: 15    End of Session Equipment Utilized During Treatment: Gait belt Activity Tolerance: Patient tolerated treatment well Patient left: with call bell/phone within reach;in chair;Other (comment)(OT present) Nurse Communication: Mobility status PT Visit Diagnosis: Other abnormalities of gait and mobility (R26.89);Muscle weakness (generalized) (M62.81)     Time: 3354-5625 PT Time Calculation (min) (ACUTE ONLY): 23 min  Charges:  $Gait Training: 23-37 mins                     Earney Navy, PTA Acute Rehabilitation Services Pager: [phone] Office: [phone]     Darliss Cheney 05/28/2018, 4:45 PM

## 2018-05-29 DIAGNOSIS — L8990 Pressure ulcer of unspecified site, unspecified stage: Secondary | ICD-10-CM

## 2018-05-29 DIAGNOSIS — E43 Unspecified severe protein-calorie malnutrition: Secondary | ICD-10-CM

## 2018-05-29 MED ORDER — AMLODIPINE BESYLATE 5 MG PO TABS: 5.0000 mg | ORAL_TABLET | Freq: Every day | ORAL | 0 refills | Status: DC

## 2018-05-29 MED ORDER — THIAMINE HCL 100 MG PO TABS: 100.0000 mg | ORAL_TABLET | Freq: Every day | ORAL | 0 refills | Status: DC

## 2018-05-29 MED ORDER — FOLIC ACID 1 MG PO TABS: 1.0000 mg | ORAL_TABLET | Freq: Every day | ORAL | 0 refills | Status: DC

## 2018-05-29 NOTE — Discharge Summary (Addendum)
Physician Discharge Summary  Sarah Gutierrez QIW:[id number] DOB: [birth year] DOA: 05/20/2018  PCP: Patient, No Pcp Per  Admit date: 05/20/2018 Discharge date: 05/30/2018  Admitted From: Home Disposition:  SNF  Recommendations for Outpatient Follow-up:  1. Patient required in and out cath for acute urinary retention while in hospital. Due to numerous in and out cath required and retention, foley catheter was placed prior to discharge. Would recommend removal and voiding trial in SNF and also consider outpatient urology referral if retention continues to be an issue.   Discharge Condition: Stable CODE STATUS: Full  Diet recommendation: Heart healthy   Brief/Interim Summary: Sarah Gutierrez is a 73 y.o. year old female with medical history significant for alcoholism (sober for last 10 years or so), HTN who presented on 05/20/2018 after being found down at home for unknown period of time and was found to have hypothermia, and altered mental status. Prior to this hospitalization, patient was treated for URI around Thanksgiving time and had improved since then.  At her baseline she lives alone.  She does drink quite a bit with history of alcoholism years ago.  Since this weekend patient has not been answering phone calls by family.  Her friend tried to come over during the same time and the door found to be locked and patient did not answer. The cops were called to evaluate and patient was found down on couch, incontinent of urine, unable to communicate. Her brother reports at baseline she is able to converse and is alert and oriented x4 with intermittent episodes of confusion and difficulty communicating with clear syntax, she has a masters degree.  Though he does admit that she had recently started drinking again after years of sobriety.  In the ED, she was found to have a calcium of 12, troponin of 0.11, sodium 155, lactic acid of 5.33.  Cardiology evaluated patient given elevated troponin and deemed this  is more likely demand ischemia given no ischemic EKG changes or abnormalities on TTE.  With IV fluids lactic acidosis resolved.  Infectious work-up was negative for UA, urine culture and blood cultures, empiric antibiotics started in the ED were discontinued.  For acute metabolic encephalopathy UDS, ethanol, ammonia were unremarkable.  With initiation of IV fluids, folate, thiamine and monitored under CIWA protocol patient became less drowsy/lethargic and more alert and oriented to self x1.   #Acute encephalopathy, improved but not to baseline. She is now oriented to self only, conversant but poor syntax with language and seems to be confabulating in conversation, no focal deficits. 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    #AKI, resolved Prerenal etiology in setting of dehydration.  Home lisinopril has been held.   #Lactic acidosis, resolved Peak of 5.33 with downtrend to 2.1 after IV fluid boluses.  Do not suspect infection as mentioned above with unremarkable procalcitonin and no localizing signs or symptoms of infection.  #Hypothermia, resolved.  Unclear etiology doubt infectious.   #Acute hypernatremia, resolved In the setting of dehydration as patient was down for some unknown period of time. Peak sodium of 155 now resolved.   #Acute hypercalcemia, resolved Elevated to 12 on admission, has improved significantly with IV fluids and now resolved.  Likely elevated in the setting of dehydration as patient was down for unknown period of time.  #Mildly elevated troponin Peak  troponin 0 0.11 down trended to 0.9. Cardiology evaluated does not not suspect cardiac etiology given chest pain-free (still limited history), nonischemic EKG, TTE with  preserved EF and no wall motion abnormalities  #History of alcoholism, no longer in withdrawal. Continue oral supplementation of thiamine, folic acid, multivitamin.   #Hypertension ACE-I held due to AKI. Started on Norvasc   #Acute urinary retention Foley catheter was discontinued on 1/13. No structural abnormalities or hydronephrosis on CT A/P. Urinary retention continued to be an issue. ?Intentionally not voiding. Foley catheter replaced prior to discharge, 1/18  #Constipation Resolved with bowel regimen    Discharge Instructions  Discharge Instructions    Diet - low sodium heart healthy   Complete by:  As directed    Discharge instructions   Complete by:  As directed    Bladder scan QID prn, in and out cath for residual > 359mL   Increase activity slowly   Complete by:  As directed      Allergies as of 06/01/2018   No Known Allergies     Medication List    STOP taking these medications   lisinopril 30 MG tablet Commonly known as:  PRINIVIL,ZESTRIL     TAKE these medications   amLODipine 5 MG tablet Commonly known as:  NORVASC Take 1 tablet (5 mg total) by mouth daily. What changed:    medication strength  how much to take   folic acid 1 MG tablet Commonly known as:  FOLVITE Take 1 tablet (1 mg total) by mouth daily.   polyethylene glycol packet Commonly known as:  MIRALAX / GLYCOLAX Take 17 g by mouth daily.   senna-docusate 8.6-50 MG tablet Commonly known as:  Senokot-S Take 1 tablet by mouth daily.   thiamine 100 MG tablet Take 1 tablet (100 mg total) by mouth daily.      Contact information for after-discharge care    Destination    [facility] Preferred SNF .   Service:  Skilled Nursing Contact information: [address] [place] [phone]             No Known Allergies  Consultations:  Cardiology    Procedures/Studies: Ct Abdomen Pelvis Wo Contrast  Result Date:  05/21/2018 CLINICAL DATA:  Altered mental status. Sepsis. Abdominal tenderness on exam. EXAM: CT ABDOMEN AND PELVIS WITHOUT CONTRAST TECHNIQUE: Multidetector CT imaging of the abdomen and pelvis was performed following the standard protocol without IV contrast. COMPARISON:  None. FINDINGS: Lower chest: Calcified granuloma in the left lower lobe. Adjacent 3 mm noncalcified nodule. No pleural fluid. Hepatobiliary: Motion artifact and lack contrast limits detailed assessment. No evidence of focal hepatic abnormality. No calcified gallstone or pericholecystic inflammation. No evident biliary dilatation. Pancreas: Parenchymal atrophy. No ductal dilatation or inflammation. Spleen: No splenic injury or perisplenic hematoma. Adrenals/Urinary Tract: Bilateral adrenal thickening without dominant nodule. Mild thinning thinning of bilateral renal parenchyma. No hydronephrosis. No perinephric edema. Urinary bladder is distended without wall thickening. Stomach/Bowel: Bowel evaluation is limited in the absence of enteric contrast and paucity of intra-abdominal fat the stomach is physiologically distended. 15 mm fat density lesion in the duodenum likely incidental lipoma. No obstruction. Ligament of Treitz is not clearly defined. Appendix not confidently visualized. No evidence of appendicitis. Mild submucosal fatty infiltration of the ascending colon consistent with prior inflammation. No acute colonic inflammatory change. No diverticulitis, no significant diverticular disease. Vascular/Lymphatic: Aorta bi-iliac atherosclerosis. No aneurysm. No bulky adenopathy. Reproductive: Uterus and bilateral adnexa are unremarkable. Other: No free  air, free fluid, or intra-abdominal fluid collection. Musculoskeletal: There are no acute or suspicious osseous abnormalities. Degenerative change in the spine with prominent Schmorl's node superior endplate of L4. IMPRESSION: 1. No acute abnormality or explanation for abdominal tenderness. 2. Aorta  bi-iliac atherosclerosis without aneurysm. Aortic Atherosclerosis (ICD10-I70.0). 3. Calcified granuloma in the left lower lobe with adjacent 3 mm noncalcified nodule. Electronically Signed   By: Keith Rake M.D.   On: 05/21/2018 01:26   Dg Chest 1 View  Result Date: 05/20/2018 CLINICAL DATA:  Altered level of consciousness. EXAM: CHEST  1 VIEW COMPARISON:  None. FINDINGS: The heart size and mediastinal contours are within normal limits. Both lungs are clear. The visualized skeletal structures are unremarkable. IMPRESSION: No active disease. Electronically Signed   By: Marijo Conception, M.D.   On: 05/20/2018 20:43   Dg Abd 1 View  Result Date: 05/31/2018 CLINICAL DATA:  Upper abdominal pain.  Constipation. EXAM: ABDOMEN - 1 VIEW COMPARISON:  CT, 05/21/2018 FINDINGS: Normal bowel gas pattern. Rectum mildly distended with stool. No significant increase in colonic stool. No evidence of renal or ureteral stones. Soft tissues are unremarkable. No acute skeletal abnormality. IMPRESSION: 1. No acute findings. 2. No increase in colonic stool. Rectum mildly distended with stool. Electronically Signed   By: Lajean Manes M.D.   On: 05/31/2018 09:44   Ct Head Wo Contrast  Result Date: 05/20/2018 CLINICAL DATA:  Altered mental status EXAM: CT HEAD WITHOUT CONTRAST TECHNIQUE: Contiguous axial images were obtained from the base of the skull through the vertex without intravenous contrast. COMPARISON:  None. FINDINGS: Brain: Motion degraded study. No acute territorial infarction, hemorrhage or intracranial mass. Moderate atrophy with mild small vessel ischemic changes of the white matter. Vascular: No hyperdense vessels.  No unexpected calcification Skull: No fracture Sinuses/Orbits: No acute finding. Other: None IMPRESSION: 1. Motion degraded study which limits the exam 2. No definite CT evidence for acute intracranial abnormality. Atrophy and mild small vessel ischemic changes of the white matter Electronically  Signed   By: Donavan Foil M.D.   On: 05/20/2018 21:07      Discharge Exam: Vitals:   05/31/18 2118 06/01/18 0452  BP: 128/63 132/62  Pulse: 84 69  Resp: 16 16  Temp: 98.5 F (36.9 C)   SpO2: 100% 100%    General: Pt is alert, awake, not in acute distress Cardiovascular: RRR, S1/S2 +, no rubs, no gallops Respiratory: CTA bilaterally, no wheezing, no rhonchi Abdominal: Soft, NT, ND, bowel sounds + Extremities: no edema, no cyanosis Neuro: oriented to self only     The results of significant diagnostics from this hospitalization (including imaging, microbiology, ancillary and laboratory) are listed below for reference.     Microbiology: No results found for this or any previous visit (from the past 240 hour(s)).   Labs: BNP (last 3 results) No results for input(s): BNP in the last 8760 hours. Basic Metabolic Panel: Recent Labs  Lab 06/01/18 0330  NA 141  K 3.8  CL 105  CO2 24  GLUCOSE 103*  BUN 10  CREATININE 0.51  CALCIUM 9.7   Liver Function Tests: No results for input(s): AST, ALT, ALKPHOS, BILITOT, PROT, ALBUMIN in the last 168 hours. No results for input(s): LIPASE, AMYLASE in the last 168 hours. No results for input(s): AMMONIA in the last 168 hours. CBC: No results for input(s): WBC, NEUTROABS, HGB, HCT, MCV, PLT in the last 168 hours. Cardiac Enzymes: No results for input(s): CKTOTAL, CKMB, CKMBINDEX, TROPONINI in the  last 168 hours. BNP: Invalid input(s): POCBNP CBG: No results for input(s): GLUCAP in the last 168 hours. D-Dimer No results for input(s): DDIMER in the last 72 hours. Hgb A1c No results for input(s): HGBA1C in the last 72 hours. Lipid Profile No results for input(s): CHOL, HDL, LDLCALC, TRIG, CHOLHDL, LDLDIRECT in the last 72 hours. Thyroid function studies No results for input(s): TSH, T4TOTAL, T3FREE, THYROIDAB in the last 72 hours.  Invalid input(s): FREET3 Anemia work up No results for input(s): VITAMINB12, FOLATE,  FERRITIN, TIBC, IRON, RETICCTPCT in the last 72 hours. Urinalysis    Component Value Date/Time   COLORURINE AMBER (A) 05/21/2018 0150   APPEARANCEUR CLEAR 05/21/2018 0150   LABSPEC 1.020 05/21/2018 0150   PHURINE 5.0 05/21/2018 0150   GLUCOSEU NEGATIVE 05/21/2018 0150   HGBUR NEGATIVE 05/21/2018 0150   BILIRUBINUR NEGATIVE 05/21/2018 0150   KETONESUR 5 (A) 05/21/2018 0150   PROTEINUR NEGATIVE 05/21/2018 0150   NITRITE NEGATIVE 05/21/2018 0150   LEUKOCYTESUR NEGATIVE 05/21/2018 0150   Sepsis Labs Invalid input(s): PROCALCITONIN,  WBC,  LACTICIDVEN Microbiology No results found for this or any previous visit (from the past 240 hour(s)).   Patient was seen and examined on the day of discharge and was found to be in stable condition. Time coordinating discharge: 35 minutes including assessment and coordination of care, as well as examination of the patient.   SIGNED:  Dessa Phi, DO [facility] [email or web address] 06/01/2018, 8:43 AM

## 2018-05-29 NOTE — Social Work (Signed)
Paperwork still pending completion with pt brother Dr. Orland Mustard. Paperwork must be completed prior to d/c to [facility] and pt remains unable to complete paperwork herself.   CSW continuing to follow for support with disposition when medically appropriate.  Westley Hummer, MSW, [place] Work [phone]

## 2018-05-29 NOTE — Social Work (Signed)
CSW spoke with pt brother Dr. Orland Mustard.  Family has selected [facility]. Pt family had several questions related to LTC placement and placement in other cities. All questions answered. They understand this is short term SNF placement. Janie at [facility] does have a bed- she will send paperwork electronically to pt brother at his request.  CSW continuing to follow for support with disposition when medically appropriate.  Westley Hummer, MSW, [place] Work [phone]

## 2018-05-29 NOTE — Progress Notes (Signed)
Patient was supposed to discharge to SNF but there is hold-up with paperwork. Unable to discharge until 1/16.  Dessa Phi, DO [facility] [email or web address] 05/29/2018, 4:21 PM

## 2018-05-30 MED ORDER — SENNOSIDES-DOCUSATE SODIUM 8.6-50 MG PO TABS
1.0000 | ORAL_TABLET | Freq: Every day | ORAL | Status: DC
  Administered 2018-05-30 – 2018-06-01 (×3): 1 via ORAL
  Filled 2018-05-30 (×3): qty 1

## 2018-05-30 MED ORDER — POLYETHYLENE GLYCOL 3350 17 G PO PACK
17.0000 g | PACK | Freq: Every day | ORAL | Status: DC
  Administered 2018-05-30 – 2018-06-01 (×3): 17 g via ORAL
  Filled 2018-05-30 (×3): qty 1

## 2018-05-30 MED ORDER — GLYCERIN (LAXATIVE) 2.1 G RE SUPP
1.0000 | Freq: Every day | RECTAL | Status: DC | PRN
  Administered 2018-05-30: 1 via RECTAL
  Filled 2018-05-30 (×3): qty 1

## 2018-05-30 MED ORDER — MAGNESIUM HYDROXIDE 400 MG/5ML PO SUSP
5.0000 mL | Freq: Every day | ORAL | Status: DC | PRN
  Administered 2018-05-31: 5 mL via ORAL
  Filled 2018-05-30: qty 30

## 2018-05-30 NOTE — Plan of Care (Signed)
  Problem: Clinical Measurements: Goal: Ability to maintain clinical measurements within normal limits will improve Outcome: Progressing Goal: Will remain free from infection Outcome: Progressing   Problem: Activity: Goal: Risk for activity intolerance will decrease Outcome: Progressing   Problem: Coping: Goal: Level of anxiety will decrease Outcome: Progressing   Problem: Pain Managment: Goal: General experience of comfort will improve Outcome: Progressing   Problem: Safety: Goal: Ability to remain free from injury will improve Outcome: Progressing   Problem: Skin Integrity: Goal: Risk for impaired skin integrity will decrease Outcome: Progressing   

## 2018-05-30 NOTE — Progress Notes (Signed)
Pt's IV removed prior to discharge. Dr. Maylene Roes aware, states to leave out at this time.

## 2018-05-30 NOTE — Clinical Social Work Placement (Addendum)
   CLINICAL SOCIAL WORK PLACEMENT  NOTE [facility]  Date:  05/30/2018  Patient Details  Name: Sarah Gutierrez MRN: [id number] Date of Birth: December 30, 1945  Clinical Social Work is seeking post-discharge placement for this patient at the [place] level of care (*CSW will initial, date and re-position this form in  chart as items are completed):  Yes   Patient/family provided with [place] Work Department's list of facilities offering this level of care within the geographic area requested by the patient (or if unable, by the patient's family).  Yes   Patient/family informed of their freedom to choose among providers that offer the needed level of care, that participate in Medicare, Medicaid or managed care program needed by the patient, have an available bed and are willing to accept the patient.  Yes   Patient/family informed of [place]'s ownership interest in [facility] and [facility], as well as of the fact that they are under no obligation to receive care at these facilities.  PASRR submitted to EDS on       PASRR number received on 05/26/18     Existing PASRR number confirmed on       FL2 transmitted to all facilities in geographic area requested by pt/family on 05/26/18     FL2 transmitted to all facilities within larger geographic area on       Patient informed that his/her managed care company has contracts with or will negotiate with certain facilities, including the following:        Yes   Patient/family informed of bed offers received.  Patient chooses bed at [facility]     Physician recommends and patient chooses bed at      Patient to be transferred to [facility] on 05/30/18.  Patient to be transferred to facility by [facility]     Patient family notified on 05/30/18 of transfer.  Name of family member notified:  pt brother Dr. Harvel Quale; pt daughter [facility]  PHYSICIAN       Additional  Comment:    _______________________________________________ Alexander Mt, LCSWA 05/30/2018, 11:13 AM

## 2018-05-30 NOTE — Social Work (Addendum)
2:54pm- Spoke with bedside RN, pt will need to have BM prior to facility accepting pt today. Bedside RN has called pt family. When pt has had a BM please call PT[facility] at [phone]. Updated admissions liaisons Santiago Glad and Juliann Pulse, they are both aware, request that pt report be called again to [phone] when pt medically ready.  11:47am- Clinical Social Worker facilitated patient discharge including contacting patient family and facility to confirm patient discharge plans.  Clinical information faxed to facility and family agreeable with plan.  CSW arranged ambulance transport via [facility] to [facility] at 1:30pm. RN to call [phone]  with report prior to discharge.  Clinical Social Worker will sign off for now as social work intervention is no longer needed. Please consult [place] again if new need arises.  Westley Hummer, MSW, [place] Social Worker [phone]

## 2018-05-30 NOTE — Social Work (Addendum)
11:09am- Spoke with pt brother, he and pt daughter have changed their preferred facility to [facility]. CSW reached out to [facility], bed available for d/c today.  8:33am- CSW spoke with pt daughter Sabino Donovan at [phone] she is hesitant about offers, asked about [facility] and [facility]. We discussed that these are CCRCs and often do not accept any additional pts outside of those who are in their community. We also discussed that the pt is medically stable for discharge and that we are only waiting on the paperwork for pt to transfer.   Pt daughter requests until 1pm to contact facilities that she had called yesterday about bed availability. CSW again reinforced that pt is ready medically per MD, pt daughter states understanding. Paperwork for [facility] has been completed by pt brother and will be sent today.   Westley Hummer, MSW, [place] Work [phone]

## 2018-05-30 NOTE — Plan of Care (Signed)
  Problem: Clinical Measurements: Goal: Will remain free from infection Outcome: Progressing Goal: Diagnostic test results will improve Outcome: Progressing   Problem: Activity: Goal: Risk for activity intolerance will decrease Outcome: Progressing   Problem: Coping: Goal: Level of anxiety will decrease Outcome: Progressing   Problem: Pain Managment: Goal: General experience of comfort will improve Outcome: Progressing   Problem: Safety: Goal: Ability to remain free from injury will improve Outcome: Progressing   Problem: Skin Integrity: Goal: Risk for impaired skin integrity will decrease Outcome: Progressing

## 2018-05-30 NOTE — Progress Notes (Signed)
Called report to [place], Therapist, sports at [facility]. Due to patient not having a documented BM since 05/24/18 the facility will not accept patient. PTAR at the hospital as report is being called, left prior to report being completed. PTAR notified patient needed to return to hospital. Patient returned to [facility]. Family, John (Pt's brother) notified. Dr. Maylene Roes notified.

## 2018-05-30 NOTE — Progress Notes (Addendum)
Patient seen and examined. Remains stable for discharge today. Discharge summary updated.   Dessa Phi, DO [facility] [email or web address] 05/30/2018, 9:58 AM

## 2018-05-31 ENCOUNTER — Inpatient Hospital Stay (HOSPITAL_COMMUNITY): Payer: Medicare Other

## 2018-05-31 MED ORDER — FLEET ENEMA 7-19 GM/118ML RE ENEM
1.0000 | ENEMA | Freq: Once | RECTAL | Status: DC
  Filled 2018-05-31: qty 1

## 2018-05-31 MED ORDER — MINERAL OIL PO OIL
TOPICAL_OIL | Freq: Once | ORAL | Status: AC
  Administered 2018-05-31: 30 mL via ORAL
  Filled 2018-05-31: qty 30

## 2018-05-31 MED ORDER — ENSURE ENLIVE PO LIQD
237.0000 mL | Freq: Two times a day (BID) | ORAL | Status: DC
  Administered 2018-05-31 – 2018-06-01 (×2): 237 mL via ORAL

## 2018-05-31 NOTE — Progress Notes (Signed)
Spoke to Dr. Maylene Roes about the Pt and not urinating.  She advised that she spoke to the Pt's brother, and brother did not want the foley place until after the Pt had BM. Brother felt that if the Pt had BM then she will be able to urinate. I spoke to the brother, and advised that the Pt had not urinated since last night, and he was aware, and requested that we wait until after she has BM. MD still had order in for foley placement,  Pt did have 3 smears of BM.

## 2018-05-31 NOTE — Progress Notes (Signed)
CSW spoke with Dr. Maylene Roes. Dr. Maylene Roes reported that she still had not had a bowel movement. If the patient does not have a bowel movement then they will proceed with an enema.   CSW has spoken with [facility] and reported that the patient still has not had a bowel movement.   Once she has one, then the facility will be able to accept the patient.   CSW will continue to follow.   Domenic Schwab, MSW, [place]

## 2018-05-31 NOTE — Progress Notes (Signed)
Bladder scan completed on the Pt at 11 AM and pt greater than 999 in bladder.  In 2 hours 6 attempts were made with I& O with no success. SWOT team was last to try, will attempt again in approximately 2 hours.

## 2018-05-31 NOTE — Progress Notes (Signed)
Nutrition Follow-up  DOCUMENTATION CODES:   Severe malnutrition in context of acute illness/injury, Underweight  INTERVENTION:   -D/c Boost Breeze po TID, each supplement provides 250 kcal and 9 grams of protein -Ensure Enlive po BID, each supplement provides 350 kcal and 20 grams of protein -Continue MVI with minerals daily -Continue 30 ml Prostat BID, each supplement provides 100 kcals and 15 grams protein  NUTRITION DIAGNOSIS:   Severe Malnutrition related to acute illness(acute metabolic encephalopathy) as evidenced by energy intake < or equal to 50% for > or equal to 5 days, mild fat depletion, moderate fat depletion, moderate muscle depletion, severe muscle depletion.  Ongoing  GOAL:   Patient will meet greater than or equal to 90% of their needs  Progressing  MONITOR:   PO intake, Supplement acceptance, Labs, Weight trends, Skin, I & O's  REASON FOR ASSESSMENT:   Low [place]    ASSESSMENT:   SHELI DORIN is a 73 y.o. female with medical history significant of hypertension, remote alcohol abuse, who presents with altered mental status.  1/10- advanced to soft diet   Reviewed I/O's: -370 ml x 24 hours and -6.3 L since admission  Case discussed with RN, who reports d/c was held from SNF yesterday secondary to no BM since 05/24/18. Per RN, pt has not had a BM today.   Spoke with pt, who was more alert and oriented from previous visit this week. She reports feeling better and intake has improved. Noted meal completion 0-25%; pt reports consuming cereal this AM for breakfast. She does not recall consuming Boost Breeze supplements, however, would like to try Ensure again.   Per pt, she has not a BM today. PTA, she was having regular bowel movements (usually once a day).   Medications reviewed and include senna-docusate and polyethylene glycol.   Labs reviewed.   Diet Order:   Diet Order            Diet - low sodium heart healthy        DIET SOFT Room service  appropriate? Yes; Fluid consistency: Thin  Diet effective now              EDUCATION NEEDS:   Education needs have been addressed  Skin:  Skin Assessment: Skin Integrity Issues: Skin Integrity Issues:: Stage I, Stage II Stage I: sacrum Stage II: buttocks  Last BM:  05/24/18  Height:   Ht Readings from Last 1 Encounters:  05/21/18 5\' 6"  (1.676 m)    Weight:   Wt Readings from Last 1 Encounters:  05/21/18 43.4 kg    Ideal Body Weight:  59.1 kg  BMI:  Body mass index is 15.44 kg/m.  Estimated Nutritional Needs:   Kcal:  1500-1700  Protein:  85-100 grams  Fluid:  > 1.5 L    Shania Bjelland A. Jimmye Norman, RD, LDN, CDE Pager: [phone] After hours Pager: [phone]

## 2018-05-31 NOTE — Progress Notes (Signed)
Physical Therapy Treatment Patient Details Name: Sarah Gutierrez MRN: [id number] DOB: May 19, 1945 Today's Date: 05/31/2018    History of Present Illness Pt is a 73 y.o. female admitted 05/20/18 after being found down at home for unknown period of time; pt with hypothermia, AKI and AMS. Head CT negative for acute abnormality. PMH includes HTN.    PT Comments    Patient agreeable to ambulate farther this session (20 ft then 25 ft with seated break) however continues to need max assist for balance in standing due to heavy posterior bias. Pt attempts to sit often while ambulating and needs encouragement to stay upright. Chair follow for safety. Pt oriented to self only. Continue to progress as tolerated with anticipated d/c to SNF for further skilled PT services.     Follow Up Recommendations  SNF;Supervision for mobility/OOB     Equipment Recommendations  (TBD)    Recommendations for Other Services       Precautions / Restrictions Precautions Precautions: Fall    Mobility  Bed Mobility               General bed mobility comments: pt OOB on BSC with RN present upon arrival  Transfers Overall transfer level: Needs assistance Equipment used: Rolling walker (2 wheeled) Transfers: Sit to/from Stand Sit to Stand: Max assist;Min assist         General transfer comment: assist to power up into standing and max A to maintain standing due to posterior lean; cues for safe hand placement  Ambulation/Gait Ambulation/Gait assistance: Max assist;+2 safety/equipment(chair follow) Gait Distance (Feet): (20 ft then 25 ft with seated break) Assistive device: Rolling walker (2 wheeled) Gait Pattern/deviations: Step-through pattern;Decreased stride length;Leaning posteriorly;Narrow base of support Gait velocity: slow   General Gait Details: max encouragement; max A to maintain balance due to heavy posterior lean and assistance to guide RW and maintain safe proximity; pt continues to  attempt to sit down a lot   Stairs             Wheelchair Mobility    Modified Rankin (Stroke Patients Only)       Balance Overall balance assessment: Needs assistance Sitting-balance support: Bilateral upper extremity supported Sitting balance-Leahy Scale: Fair Sitting balance - Comments: requires assist to sit upright and pull self to sitting edge of chair     Standing balance-Leahy Scale: Zero                              Cognition Arousal/Alertness: Awake/alert Behavior During Therapy: WFL for tasks assessed/performed Overall Cognitive Status: Impaired/Different from baseline Area of Impairment: Memory;Awareness;Safety/judgement;Orientation;Following commands;Problem solving                 Orientation Level: Disoriented to;Place;Situation;Time Current Attention Level: Focused Memory: Decreased short-term memory Following Commands: Follows one step commands with increased time Safety/Judgement: Decreased awareness of safety;Decreased awareness of deficits Awareness: Intellectual Problem Solving: Slow processing;Requires verbal cues;Difficulty sequencing General Comments: oriented to self only; states it's 1999 and place is "[place]"       Exercises      General Comments General comments (skin integrity, edema, etc.): family present in hallway      Pertinent Vitals/Pain Pain Assessment: Faces Faces Pain Scale: No hurt    Home Living                      Prior Function  PT Goals (current goals can now be found in the care plan section) Acute Rehab PT Goals Patient Stated Goal: Lay back down Progress towards PT goals: Progressing toward goals    Frequency    Min 2X/week      PT Plan Current plan remains appropriate    Co-evaluation              AM-PAC PT "6 Clicks" Mobility   Outcome Measure  Help needed turning from your back to your side while in a flat bed without using bedrails?: None Help  needed moving from lying on your back to sitting on the side of a flat bed without using bedrails?: None Help needed moving to and from a bed to a chair (including a wheelchair)?: A Lot Help needed standing up from a chair using your arms (e.g., wheelchair or bedside chair)?: A Lot Help needed to walk in hospital room?: A Lot Help needed climbing 3-5 steps with a railing? : Total 6 Click Score: 15    End of Session Equipment Utilized During Treatment: Gait belt Activity Tolerance: Patient tolerated treatment well Patient left: with call bell/phone within reach;in chair;with chair alarm set;with family/visitor present Nurse Communication: Mobility status PT Visit Diagnosis: Other abnormalities of gait and mobility (R26.89);Muscle weakness (generalized) (M62.81)     Time: 2694-8546 PT Time Calculation (min) (ACUTE ONLY): 22 min  Charges:  $Gait Training: 8-22 mins                     Earney Navy, PTA Acute Rehabilitation Services Pager: [phone] Office: [phone]     Darliss Cheney 05/31/2018, 5:21 PM

## 2018-05-31 NOTE — Progress Notes (Signed)
PROGRESS NOTE    Sarah Gutierrez  RWE:[id number] DOB: July 08, 1945 DOA: 05/20/2018 PCP: Patient, No Pcp Per     Brief Narrative:  Sarah Gutierrez a 73 y.o.year old femalewith medical history significant for alcoholism (sober for last 10 years or so), HTN who presented on 1/6/2020after being found down at home for unknown period of time and was found to have hypothermia, and altered mental status. Prior to this hospitalization, patient was treated for URI around Thanksgiving time and had improved since then. At her baseline she lives alone. She does drink quite a bit with history of alcoholism years ago. Since this weekend patient has not been answering phone calls by family. Her friend tried to come over during the same time and the door found to be locked and patient did not answer.The cops were called to evaluate and patient was found down on couch, incontinent of urine, unable to communicate. Her brother reports at baseline she is able to converse and is alert and oriented x4with intermittent episodes of confusion and difficulty communicating with clear syntax,she has a masters degree. Though he does admit that she had recently started drinking again after years of sobriety.  In the ED,she was found to have a calcium of 12, troponin of 0.11, sodium 155, lactic acid of 5.33. Cardiology evaluated patient given elevated troponin and deemed this is more likely demand ischemia given no ischemic EKG changes or abnormalities on TTE. With IV fluids lactic acidosis resolved. Infectious work-up was negative for UA, urine culture and blood cultures, empiric antibiotics started in the ED were discontinued. For acute metabolic encephalopathy UDS, ethanol, ammonia were unremarkable. With initiation of IV fluids, folate, thiamine and monitored under CIWA protocol patient became less drowsy/lethargic and more alert and oriented to self x1.   New events last 24 hours / Subjective: Patient was  supposed to discharge to skilled nursing facility on 1/16, however facility did not accept due to patient not having had a bowel movement documented since 1/10.  Patient was given bowel regimen, suppository without relief.  KUB completed this morning without significant stool burden or abnormal bowel gas patterns.  Patient also still having urinary retention.  Discussed with brother and Therapist, sports.  Continue to encourage patient, a lot of this seems to be motivational, patient tenses up when attempting in and out, complaining of some abdominal discomfort but does not attempt to void.  Assessment & Plan:   Principal Problem:   Acute metabolic encephalopathy Active Problems:   Hypertension   Dehydration   Hypercalcemia   Hypernatremia   Lactic acid acidosis   Elevated troponin   Protein-calorie malnutrition, severe   Pressure injury of skin  #Acute encephalopathy, improved but not to baseline. She is now oriented to selfonly, conversantbut poor syntax with language and seems to be confabulating in conversation, no focal deficits. 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   #AKI, resolved Prerenal etiology in setting of dehydration. Home lisinopril has been held.   #Lactic acidosis, resolved Peak of 5.33 with downtrend to 2.1 after IV fluid boluses. Do not suspect infection as mentioned above with unremarkable procalcitonin and no localizing signs or symptoms of infection.  #Hypothermia, resolved. Unclear etiology doubt infectious.   #Acute hypernatremia, resolved In the setting of dehydration as patient was down for some unknown period of time.Peak sodium of  155 now resolved.   #Acute hypercalcemia, resolved Elevated to 12 on admission, has  improved significantly with IV fluids and now resolved. Likely elevated in the setting of dehydration as patient was down for unknown period of time.  #Mildly elevated troponin Peak troponin 0 0.11 down trended to 0.9. Cardiology evaluated does not not suspect cardiac etiology given chest pain-free (still limited history), nonischemic EKG, TTE with preserved EF and no wall motion abnormalities  #History of alcoholism, no longer in withdrawal. Continue oral supplementation of thiamine, folic acid, multivitamin.   #Hypertension ACE-I held due to AKI. Started on Norvasc   #Acute urinary retention Foley catheter was discontinued on 1/13. No structural abnormalities or hydronephrosis on CT A/P.Scheduled bladder scans, in and out catheterization for PVR greater than 374mL. May need Urology referral as outpatient.   #Constipation Bowel regimen ordered.    Antimicrobials:  Anti-infectives (From admission, onward)   Start     Dose/Rate Route Frequency Ordered Stop   05/22/18 2200  vancomycin (VANCOCIN) 500 mg in sodium chloride 0.9 % 100 mL IVPB  Status:  Discontinued     500 mg 100 mL/hr over 60 Minutes Intravenous Every 48 hours 05/20/18 2201 05/21/18 0732   05/21/18 1100  ceFEPIme (MAXIPIME) 1 g in sodium chloride 0.9 % 100 mL IVPB  Status:  Discontinued     1 g 200 mL/hr over 30 Minutes Intravenous Every 12 hours 05/20/18 2201 05/21/18 0732   05/20/18 2200  ceFEPIme (MAXIPIME) 2 g in sodium chloride 0.9 % 100 mL IVPB     2 g 200 mL/hr over 30 Minutes Intravenous  Once 05/20/18 2146 05/20/18 2251   05/20/18 2200  metroNIDAZOLE (FLAGYL) IVPB 500 mg  Status:  Discontinued     500 mg 100 mL/hr over 60 Minutes Intravenous Every 8 hours 05/20/18 2146 05/21/18 0732   05/20/18 2200  vancomycin (VANCOCIN) IVPB 1000 mg/200 mL premix     1,000 mg 200 mL/hr over 60 Minutes Intravenous  Once 05/20/18 2146 05/21/18 0152        Objective: Vitals:   05/30/18 2127 05/31/18 0602 05/31/18  0951 05/31/18 1539  BP: (!) 126/53 (!) 111/58 (!) 141/67 (!) 146/77  Pulse: 79 68  (!) 108  Resp: 18 16  18   Temp: 98.8 F (37.1 C) 98.5 F (36.9 C)  98.1 F (36.7 C)  TempSrc: Oral Oral  Oral  SpO2: 99% 100%  100%  Weight:      Height:        Intake/Output Summary (Last 24 hours) at 05/31/2018 1556 Last data filed at 05/31/2018 1247 Gross per 24 hour  Intake 700 ml  Output -  Net 700 ml   Filed Weights   05/20/18 2019 05/21/18 1312  Weight: 40.8 kg 43.4 kg    Examination:  General exam: Appears calm and comfortable  Respiratory system: Clear to auscultation. Respiratory effort normal. Cardiovascular system: S1 & S2 heard, RRR. No JVD, murmurs, rubs, gallops or clicks. No pedal edema. Gastrointestinal system: Abdomen is nondistended, soft and nontender. No organomegaly or masses felt. Normal bowel sounds heard. Central nervous system: Alert  Extremities: Symmetric Skin: No rashes, lesions or ulcers Psychiatry: Judgement and insight appear poor, not oriented   Data Reviewed: I have personally reviewed following labs and imaging studies  CBC: No results for input(s): WBC, NEUTROABS, HGB, HCT, MCV, PLT in the last 168 hours. Basic Metabolic Panel: No results for input(s): NA, K, CL, CO2, GLUCOSE, BUN, CREATININE, CALCIUM, MG, PHOS in the last 168  hours. GFR: Estimated Creatinine Clearance: 43.6 mL/min (by C-G formula based on SCr of 0.64 mg/dL). Liver Function Tests: No results for input(s): AST, ALT, ALKPHOS, BILITOT, PROT, ALBUMIN in the last 168 hours. No results for input(s): LIPASE, AMYLASE in the last 168 hours. No results for input(s): AMMONIA in the last 168 hours. Coagulation Profile: No results for input(s): INR, PROTIME in the last 168 hours. Cardiac Enzymes: No results for input(s): CKTOTAL, CKMB, CKMBINDEX, TROPONINI in the last 168 hours. BNP (last 3 results) No results for input(s): PROBNP in the last 8760 hours. HbA1C: No results for input(s): HGBA1C  in the last 72 hours. CBG: No results for input(s): GLUCAP in the last 168 hours. Lipid Profile: No results for input(s): CHOL, HDL, LDLCALC, TRIG, CHOLHDL, LDLDIRECT in the last 72 hours. Thyroid Function Tests: No results for input(s): TSH, T4TOTAL, FREET4, T3FREE, THYROIDAB in the last 72 hours. Anemia Panel: No results for input(s): VITAMINB12, FOLATE, FERRITIN, TIBC, IRON, RETICCTPCT in the last 72 hours. Sepsis Labs: No results for input(s): PROCALCITON, LATICACIDVEN in the last 168 hours.  No results found for this or any previous visit (from the past 240 hour(s)).     Radiology Studies: Dg Abd 1 View  Result Date: 05/31/2018 CLINICAL DATA:  Upper abdominal pain.  Constipation. EXAM: ABDOMEN - 1 VIEW COMPARISON:  CT, 05/21/2018 FINDINGS: Normal bowel gas pattern. Rectum mildly distended with stool. No significant increase in colonic stool. No evidence of renal or ureteral stones. Soft tissues are unremarkable. No acute skeletal abnormality. IMPRESSION: 1. No acute findings. 2. No increase in colonic stool. Rectum mildly distended with stool. Electronically Signed   By: Lajean Manes M.D.   On: 05/31/2018 09:44      Scheduled Meds: . amLODipine  5 mg Oral Daily  . feeding supplement (ENSURE ENLIVE)  237 mL Oral BID BM  . feeding supplement (PRO-STAT SUGAR FREE 64)  30 mL Oral BID  . folic acid  1 mg Oral Daily  . heparin  5,000 Units Subcutaneous Q8H  . mouth rinse  15 mL Mouth Rinse BID  . mineral oil   Oral Once  . multivitamin with minerals  1 tablet Oral Daily  . polyethylene glycol  17 g Oral Daily  . senna-docusate  1 tablet Oral Daily  . sodium phosphate  1 enema Rectal Once  . thiamine  100 mg Oral Daily   Continuous Infusions:   LOS: 10 days    Time spent: 25  minutes   Dessa Phi, DO [facility] [email or web address] 05/31/2018, 3:56 PM

## 2018-06-01 LAB — BASIC METABOLIC PANEL
Anion gap: 12 (ref 5–15)
BUN: 10 mg/dL (ref 8–23)
CO2: 24 mmol/L (ref 22–32)
Calcium: 9.7 mg/dL (ref 8.9–10.3)
Chloride: 105 mmol/L (ref 98–111)
Creatinine, Ser: 0.51 mg/dL (ref 0.44–1.00)
GFR calc Af Amer: >60 mL/min (ref >60)
GFR calc non Af Amer: >60 mL/min (ref >60)
Glucose, Bld: 103 mg/dL — ABNORMAL HIGH (ref 70–99)
Potassium: 3.8 mmol/L (ref 3.5–5.1)
Sodium: 141 mmol/L (ref 135–145)

## 2018-06-01 MED ORDER — POLYETHYLENE GLYCOL 3350 17 G PO PACK: 17.0000 g | PACK | Freq: Every day | ORAL | 0 refills | Status: AC

## 2018-06-01 MED ORDER — SENNOSIDES-DOCUSATE SODIUM 8.6-50 MG PO TABS: 1.0000 | ORAL_TABLET | Freq: Every day | ORAL | 0 refills | Status: AC

## 2018-06-01 NOTE — Progress Notes (Signed)
Nurse provided report to nurse Marily Memos) at [facility] by phone.  AVS printed and with pt chart for [facility] when they arrive.  Brother at bedside.  Will continue to monitor.

## 2018-06-01 NOTE — Progress Notes (Signed)
Patient had large incontinent bowel movement last night around 3:and then was assisted to bedside commode where she had a small BM. She did not urinate and her bladder appeared distended. Bladder scan performed which showed greater than 999ccs retained urine. Foley catheter placed per order. 1000 cc's urine returned immediately. After Foley catheter placed, pt was again assisted to the [facility] where she had another BM-moderate amount of soft brown stool;.

## 2018-06-01 NOTE — Progress Notes (Signed)
Patient being transported to [facility] via [facility].  Daughter at bedside.

## 2018-06-01 NOTE — Progress Notes (Signed)
Patient will Discharge To: [facility] Anticipated DC Date:06/01/2018 Family Notified:yes, brother Dr. Deniece Ree [phone] Transport AK:[id number]   Per MD patient ready for DC to [facility]. RN, patient, patient's family, and facility notified of DC. Assessment, Fl2/Pasrr, and Discharge Summary sent to facility. RN given number for report [phone], Room # 102 B). DC packet on chart. Ambulance transport requested for patient.   CSW signing off.  [name] LCSWA [phone]

## 2018-06-04 DIAGNOSIS — R531 Weakness: Secondary | ICD-10-CM | POA: Diagnosis not present

## 2018-06-04 DIAGNOSIS — R278 Other lack of coordination: Secondary | ICD-10-CM | POA: Diagnosis not present

## 2018-06-04 DIAGNOSIS — R5381 Other malaise: Secondary | ICD-10-CM | POA: Diagnosis not present

## 2018-06-04 DIAGNOSIS — R4189 Other symptoms and signs involving cognitive functions and awareness: Secondary | ICD-10-CM | POA: Diagnosis not present

## 2018-06-04 DIAGNOSIS — G9340 Encephalopathy, unspecified: Secondary | ICD-10-CM | POA: Diagnosis not present

## 2018-06-04 DIAGNOSIS — I10 Essential (primary) hypertension: Secondary | ICD-10-CM | POA: Diagnosis not present

## 2018-06-04 DIAGNOSIS — R262 Difficulty in walking, not elsewhere classified: Secondary | ICD-10-CM | POA: Diagnosis not present

## 2018-06-05 ENCOUNTER — Other Ambulatory Visit: Payer: Self-pay | Admitting: *Deleted

## 2018-06-05 NOTE — Patient Outreach (Signed)
[facility]) Care Management  06/05/2018  CHARLOTT CALVARIO 1945-06-13 [id number]  Onsite visit to facility.  Met with Marita Kansas, discharge planner.  She reports discharge plan will be LTC in memory care unit most likely, they have given daughter a list of facilities to review for [place], [place] and [facility] in [place]. Patient daughter is involved in case but lives out of town.   Plan to sign off at this time as not [facility] care management needs assessed at this time.  RNCM can be re-consulted if discharge plan changes and [facility] care management needs arise. Will collaborate with [facility] Royetta Crochet. Laymond Purser, MSN, RN, [employer] , [place] [phone]) Business Cell  [phone]) Toll Free Office

## 2018-06-06 DIAGNOSIS — R278 Other lack of coordination: Secondary | ICD-10-CM | POA: Diagnosis not present

## 2018-06-06 DIAGNOSIS — R5381 Other malaise: Secondary | ICD-10-CM | POA: Diagnosis not present

## 2018-06-06 DIAGNOSIS — R262 Difficulty in walking, not elsewhere classified: Secondary | ICD-10-CM | POA: Diagnosis not present

## 2018-06-06 DIAGNOSIS — R4189 Other symptoms and signs involving cognitive functions and awareness: Secondary | ICD-10-CM | POA: Diagnosis not present

## 2018-06-11 DIAGNOSIS — E559 Vitamin D deficiency, unspecified: Secondary | ICD-10-CM | POA: Diagnosis not present

## 2018-06-11 DIAGNOSIS — G9340 Encephalopathy, unspecified: Secondary | ICD-10-CM | POA: Diagnosis not present

## 2018-06-11 DIAGNOSIS — M6281 Muscle weakness (generalized): Secondary | ICD-10-CM | POA: Diagnosis not present

## 2018-06-11 DIAGNOSIS — F0150 Vascular dementia without behavioral disturbance: Secondary | ICD-10-CM | POA: Diagnosis not present

## 2018-06-19 DIAGNOSIS — R110 Nausea: Secondary | ICD-10-CM | POA: Diagnosis not present

## 2018-06-19 DIAGNOSIS — R109 Unspecified abdominal pain: Secondary | ICD-10-CM | POA: Diagnosis not present

## 2018-06-21 DIAGNOSIS — R4182 Altered mental status, unspecified: Secondary | ICD-10-CM | POA: Diagnosis not present

## 2018-06-21 DIAGNOSIS — G9340 Encephalopathy, unspecified: Secondary | ICD-10-CM | POA: Diagnosis not present

## 2018-06-21 DIAGNOSIS — I10 Essential (primary) hypertension: Secondary | ICD-10-CM | POA: Diagnosis not present

## 2018-06-21 DIAGNOSIS — N178 Other acute kidney failure: Secondary | ICD-10-CM | POA: Diagnosis not present

## 2018-06-21 DIAGNOSIS — K5900 Constipation, unspecified: Secondary | ICD-10-CM | POA: Diagnosis not present

## 2018-06-21 DIAGNOSIS — R339 Retention of urine, unspecified: Secondary | ICD-10-CM | POA: Diagnosis not present

## 2018-06-24 ENCOUNTER — Encounter: Payer: Self-pay | Admitting: Neurology

## 2018-06-24 ENCOUNTER — Telehealth: Payer: Self-pay | Admitting: Neurology

## 2018-06-24 DIAGNOSIS — R339 Retention of urine, unspecified: Secondary | ICD-10-CM | POA: Diagnosis not present

## 2018-06-24 DIAGNOSIS — N178 Other acute kidney failure: Secondary | ICD-10-CM | POA: Diagnosis not present

## 2018-06-24 DIAGNOSIS — R4182 Altered mental status, unspecified: Secondary | ICD-10-CM | POA: Diagnosis not present

## 2018-06-24 DIAGNOSIS — K5900 Constipation, unspecified: Secondary | ICD-10-CM | POA: Diagnosis not present

## 2018-06-24 DIAGNOSIS — G9340 Encephalopathy, unspecified: Secondary | ICD-10-CM | POA: Diagnosis not present

## 2018-06-24 DIAGNOSIS — I10 Essential (primary) hypertension: Secondary | ICD-10-CM | POA: Diagnosis not present

## 2018-06-24 NOTE — Telephone Encounter (Signed)
Patient's brother called and would like to speak with you regarding his sister or even Dr. Delice Lesch. Her Brother is Dr. Orland Mustard in [place], [place]. He is a Immunologist. He is just wanting to give some information on his sister. She is scheduled 07/31/18.  Please Call [phone]. Thanks

## 2018-06-24 NOTE — Telephone Encounter (Signed)
Dr. Orland Mustard is not listed on pt's DPR, but is her emergency contact.  Was not sure if you would want to talk to him yourself or not.

## 2018-06-24 NOTE — Telephone Encounter (Signed)
Spoke to her brother, Dr. Orland Mustard who wanted to provide additional info prior to her visit. Hx of HTN, only med issue was alcohol, abused alcohol years ago and recovered completely (lost husband >1yrs ago). Recently had been drinking beer, not sure how much she consumed, but around a month ago, lived alone, he called her a month ago with no answer. Sent a friend over who had to break in and found her with a few beer cans around her, severely dehydrated and totally disoriented. Admitted to [facility], assessment was that she may have had Wernicke's. Memory has come back quite a bit but still having major difficulty with short and long term memory. She was initially at rehab x 3 weeks, then just moved last Thurs to [facility]. He has pursued a guardianship, hearing   Prior to last month, cognitively pretty good, not totally normal. 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 Right now she does not know what year/day of week, confabulates quite a bit. She may have had mild confabulation prior. Was not missing bills, walk 3 miles daily very active, driving, did all the yardwork. Has never been very social. Has a daughter [place] in [place] who is involved in her care. The patient has some nystagmus and tremor that she did not have before, still having diff with gait and balance. Family would see her every couple of months, did not make it to Thanksgiving as she was sick. Brother last talked to her 12/27, sounded great and normal. He thinks drinking started within the past 6 months, brother found case of beer in the pantry and they found 4-6 empty cans in her living room. In the past she was drinking vodka. No paranoia or hallucinations. Personality is very pleasant, which has not changed much. She is aware of it and can't come up with the right words.

## 2018-06-26 DIAGNOSIS — G9340 Encephalopathy, unspecified: Secondary | ICD-10-CM | POA: Diagnosis not present

## 2018-06-26 DIAGNOSIS — K5900 Constipation, unspecified: Secondary | ICD-10-CM | POA: Diagnosis not present

## 2018-06-26 DIAGNOSIS — R339 Retention of urine, unspecified: Secondary | ICD-10-CM | POA: Diagnosis not present

## 2018-06-26 DIAGNOSIS — N178 Other acute kidney failure: Secondary | ICD-10-CM | POA: Diagnosis not present

## 2018-06-26 DIAGNOSIS — R4182 Altered mental status, unspecified: Secondary | ICD-10-CM | POA: Diagnosis not present

## 2018-06-26 DIAGNOSIS — I10 Essential (primary) hypertension: Secondary | ICD-10-CM | POA: Diagnosis not present

## 2018-06-28 DIAGNOSIS — I10 Essential (primary) hypertension: Secondary | ICD-10-CM | POA: Diagnosis not present

## 2018-06-28 DIAGNOSIS — R4182 Altered mental status, unspecified: Secondary | ICD-10-CM | POA: Diagnosis not present

## 2018-06-28 DIAGNOSIS — N178 Other acute kidney failure: Secondary | ICD-10-CM | POA: Diagnosis not present

## 2018-06-28 DIAGNOSIS — G9340 Encephalopathy, unspecified: Secondary | ICD-10-CM | POA: Diagnosis not present

## 2018-06-28 DIAGNOSIS — R339 Retention of urine, unspecified: Secondary | ICD-10-CM | POA: Diagnosis not present

## 2018-06-28 DIAGNOSIS — K5900 Constipation, unspecified: Secondary | ICD-10-CM | POA: Diagnosis not present

## 2018-07-01 DIAGNOSIS — K5900 Constipation, unspecified: Secondary | ICD-10-CM | POA: Diagnosis not present

## 2018-07-01 DIAGNOSIS — I10 Essential (primary) hypertension: Secondary | ICD-10-CM | POA: Diagnosis not present

## 2018-07-01 DIAGNOSIS — R339 Retention of urine, unspecified: Secondary | ICD-10-CM | POA: Diagnosis not present

## 2018-07-01 DIAGNOSIS — N178 Other acute kidney failure: Secondary | ICD-10-CM | POA: Diagnosis not present

## 2018-07-01 DIAGNOSIS — R4182 Altered mental status, unspecified: Secondary | ICD-10-CM | POA: Diagnosis not present

## 2018-07-01 DIAGNOSIS — G9340 Encephalopathy, unspecified: Secondary | ICD-10-CM | POA: Diagnosis not present

## 2018-07-02 DIAGNOSIS — Z79899 Other long term (current) drug therapy: Secondary | ICD-10-CM | POA: Diagnosis not present

## 2018-07-03 DIAGNOSIS — R4182 Altered mental status, unspecified: Secondary | ICD-10-CM | POA: Diagnosis not present

## 2018-07-03 DIAGNOSIS — G9340 Encephalopathy, unspecified: Secondary | ICD-10-CM | POA: Diagnosis not present

## 2018-07-03 DIAGNOSIS — K5900 Constipation, unspecified: Secondary | ICD-10-CM | POA: Diagnosis not present

## 2018-07-03 DIAGNOSIS — N178 Other acute kidney failure: Secondary | ICD-10-CM | POA: Diagnosis not present

## 2018-07-03 DIAGNOSIS — I10 Essential (primary) hypertension: Secondary | ICD-10-CM | POA: Diagnosis not present

## 2018-07-03 DIAGNOSIS — B009 Herpesviral infection, unspecified: Secondary | ICD-10-CM | POA: Diagnosis not present

## 2018-07-03 DIAGNOSIS — R339 Retention of urine, unspecified: Secondary | ICD-10-CM | POA: Diagnosis not present

## 2018-07-04 DIAGNOSIS — R339 Retention of urine, unspecified: Secondary | ICD-10-CM | POA: Diagnosis not present

## 2018-07-04 DIAGNOSIS — R4182 Altered mental status, unspecified: Secondary | ICD-10-CM | POA: Diagnosis not present

## 2018-07-04 DIAGNOSIS — I10 Essential (primary) hypertension: Secondary | ICD-10-CM | POA: Diagnosis not present

## 2018-07-04 DIAGNOSIS — G9340 Encephalopathy, unspecified: Secondary | ICD-10-CM | POA: Diagnosis not present

## 2018-07-04 DIAGNOSIS — N178 Other acute kidney failure: Secondary | ICD-10-CM | POA: Diagnosis not present

## 2018-07-04 DIAGNOSIS — K5900 Constipation, unspecified: Secondary | ICD-10-CM | POA: Diagnosis not present

## 2018-07-08 DIAGNOSIS — R339 Retention of urine, unspecified: Secondary | ICD-10-CM | POA: Diagnosis not present

## 2018-07-08 DIAGNOSIS — K5900 Constipation, unspecified: Secondary | ICD-10-CM | POA: Diagnosis not present

## 2018-07-08 DIAGNOSIS — G9340 Encephalopathy, unspecified: Secondary | ICD-10-CM | POA: Diagnosis not present

## 2018-07-08 DIAGNOSIS — R4182 Altered mental status, unspecified: Secondary | ICD-10-CM | POA: Diagnosis not present

## 2018-07-08 DIAGNOSIS — I10 Essential (primary) hypertension: Secondary | ICD-10-CM | POA: Diagnosis not present

## 2018-07-08 DIAGNOSIS — N178 Other acute kidney failure: Secondary | ICD-10-CM | POA: Diagnosis not present

## 2018-07-09 DIAGNOSIS — R339 Retention of urine, unspecified: Secondary | ICD-10-CM | POA: Diagnosis not present

## 2018-07-09 DIAGNOSIS — I10 Essential (primary) hypertension: Secondary | ICD-10-CM | POA: Diagnosis not present

## 2018-07-09 DIAGNOSIS — G9340 Encephalopathy, unspecified: Secondary | ICD-10-CM | POA: Diagnosis not present

## 2018-07-09 DIAGNOSIS — N178 Other acute kidney failure: Secondary | ICD-10-CM | POA: Diagnosis not present

## 2018-07-09 DIAGNOSIS — K5900 Constipation, unspecified: Secondary | ICD-10-CM | POA: Diagnosis not present

## 2018-07-09 DIAGNOSIS — R4182 Altered mental status, unspecified: Secondary | ICD-10-CM | POA: Diagnosis not present

## 2018-07-10 DIAGNOSIS — R4182 Altered mental status, unspecified: Secondary | ICD-10-CM | POA: Diagnosis not present

## 2018-07-10 DIAGNOSIS — N178 Other acute kidney failure: Secondary | ICD-10-CM | POA: Diagnosis not present

## 2018-07-10 DIAGNOSIS — R339 Retention of urine, unspecified: Secondary | ICD-10-CM | POA: Diagnosis not present

## 2018-07-10 DIAGNOSIS — G9340 Encephalopathy, unspecified: Secondary | ICD-10-CM | POA: Diagnosis not present

## 2018-07-10 DIAGNOSIS — F419 Anxiety disorder, unspecified: Secondary | ICD-10-CM | POA: Diagnosis not present

## 2018-07-10 DIAGNOSIS — K5900 Constipation, unspecified: Secondary | ICD-10-CM | POA: Diagnosis not present

## 2018-07-10 DIAGNOSIS — R410 Disorientation, unspecified: Secondary | ICD-10-CM | POA: Diagnosis not present

## 2018-07-10 DIAGNOSIS — S51011A Laceration without foreign body of right elbow, initial encounter: Secondary | ICD-10-CM | POA: Diagnosis not present

## 2018-07-10 DIAGNOSIS — I10 Essential (primary) hypertension: Secondary | ICD-10-CM | POA: Diagnosis not present

## 2018-07-11 DIAGNOSIS — S51011A Laceration without foreign body of right elbow, initial encounter: Secondary | ICD-10-CM | POA: Diagnosis not present

## 2018-07-11 DIAGNOSIS — I10 Essential (primary) hypertension: Secondary | ICD-10-CM | POA: Diagnosis not present

## 2018-07-11 DIAGNOSIS — R419 Unspecified symptoms and signs involving cognitive functions and awareness: Secondary | ICD-10-CM | POA: Diagnosis not present

## 2018-07-11 DIAGNOSIS — F449 Dissociative and conversion disorder, unspecified: Secondary | ICD-10-CM | POA: Diagnosis not present

## 2018-07-12 ENCOUNTER — Emergency Department (HOSPITAL_COMMUNITY): Payer: Medicare Other

## 2018-07-12 ENCOUNTER — Emergency Department (HOSPITAL_COMMUNITY)
Admission: EM | Admit: 2018-07-12 | Discharge: 2018-07-12 | Disposition: A | Discharge status: Home / Self Care | Payer: Medicare Other | Attending: Emergency Medicine | Admitting: Emergency Medicine

## 2018-07-12 DIAGNOSIS — R109 Unspecified abdominal pain: Secondary | ICD-10-CM | POA: Diagnosis not present

## 2018-07-12 DIAGNOSIS — K5900 Constipation, unspecified: Secondary | ICD-10-CM | POA: Diagnosis not present

## 2018-07-12 DIAGNOSIS — I10 Essential (primary) hypertension: Secondary | ICD-10-CM | POA: Insufficient documentation

## 2018-07-12 DIAGNOSIS — W19XXXA Unspecified fall, initial encounter: Secondary | ICD-10-CM | POA: Diagnosis not present

## 2018-07-12 DIAGNOSIS — R339 Retention of urine, unspecified: Secondary | ICD-10-CM | POA: Diagnosis not present

## 2018-07-12 DIAGNOSIS — R1084 Generalized abdominal pain: Secondary | ICD-10-CM

## 2018-07-12 DIAGNOSIS — Z79899 Other long term (current) drug therapy: Secondary | ICD-10-CM | POA: Insufficient documentation

## 2018-07-12 DIAGNOSIS — N178 Other acute kidney failure: Secondary | ICD-10-CM | POA: Diagnosis not present

## 2018-07-12 DIAGNOSIS — S2242XA Multiple fractures of ribs, left side, initial encounter for closed fracture: Secondary | ICD-10-CM | POA: Diagnosis not present

## 2018-07-12 DIAGNOSIS — R4182 Altered mental status, unspecified: Secondary | ICD-10-CM | POA: Diagnosis not present

## 2018-07-12 DIAGNOSIS — R404 Transient alteration of awareness: Secondary | ICD-10-CM | POA: Diagnosis not present

## 2018-07-12 DIAGNOSIS — R279 Unspecified lack of coordination: Secondary | ICD-10-CM | POA: Diagnosis not present

## 2018-07-12 DIAGNOSIS — G9340 Encephalopathy, unspecified: Secondary | ICD-10-CM | POA: Diagnosis not present

## 2018-07-12 DIAGNOSIS — Z743 Need for continuous supervision: Secondary | ICD-10-CM | POA: Diagnosis not present

## 2018-07-12 DIAGNOSIS — R410 Disorientation, unspecified: Secondary | ICD-10-CM | POA: Diagnosis not present

## 2018-07-12 DIAGNOSIS — R51 Headache: Secondary | ICD-10-CM | POA: Diagnosis not present

## 2018-07-12 DIAGNOSIS — R29810 Facial weakness: Secondary | ICD-10-CM | POA: Diagnosis not present

## 2018-07-12 LAB — COMPREHENSIVE METABOLIC PANEL
ALT: 11 U/L (ref 0–44)
AST: 20 U/L (ref 15–41)
Albumin: 2.5 g/dL — ABNORMAL LOW (ref 3.5–5.0)
Alkaline Phosphatase: 92 U/L (ref 38–126)
Anion gap: 8 (ref 5–15)
BUN: 8 mg/dL (ref 8–23)
CO2: 25 mmol/L (ref 22–32)
Calcium: 9.9 mg/dL (ref 8.9–10.3)
Chloride: 107 mmol/L (ref 98–111)
Creatinine, Ser: 0.74 mg/dL (ref 0.44–1.00)
GFR calc Af Amer: >60 mL/min (ref >60)
GFR calc non Af Amer: >60 mL/min (ref >60)
Glucose, Bld: 102 mg/dL — ABNORMAL HIGH (ref 70–99)
Potassium: 3.9 mmol/L (ref 3.5–5.1)
Sodium: 140 mmol/L (ref 135–145)
Total Bilirubin: 0.3 mg/dL (ref 0.3–1.2)
Total Protein: 6.1 g/dL — ABNORMAL LOW (ref 6.5–8.1)

## 2018-07-12 LAB — CBG MONITORING, ED: Glucose-Capillary: 84 mg/dL (ref 70–99)

## 2018-07-12 LAB — CBC WITH DIFFERENTIAL/PLATELET
Abs Immature Granulocytes: 0.03 10*3/uL (ref 0.00–0.07)
Basophils Absolute: 0 10*3/uL (ref 0.0–0.1)
Basophils Relative: 0 %
Eosinophils Absolute: 0 10*3/uL (ref 0.0–0.5)
Eosinophils Relative: 0 %
HCT: 38.5 % (ref 36.0–46.0)
Hemoglobin: 12 g/dL (ref 12.0–15.0)
Immature Granulocytes: 0 %
Lymphocytes Relative: 7 %
Lymphs Abs: 0.7 10*3/uL (ref 0.7–4.0)
MCH: 31.9 pg (ref 26.0–34.0)
MCHC: 31.2 g/dL (ref 30.0–36.0)
MCV: 102.4 fL — ABNORMAL HIGH (ref 80.0–100.0)
Monocytes Absolute: 0.3 10*3/uL (ref 0.1–1.0)
Monocytes Relative: 4 %
Neutro Abs: 7.7 10*3/uL (ref 1.7–7.7)
Neutrophils Relative %: 89 %
Platelets: 604 10*3/uL — ABNORMAL HIGH (ref 150–400)
RBC: 3.76 MIL/uL — ABNORMAL LOW (ref 3.87–5.11)
RDW: 13.9 % (ref 11.5–15.5)
WBC: 8.8 10*3/uL (ref 4.0–10.5)
nRBC: 0 % (ref 0.0–0.2)

## 2018-07-12 LAB — ETHANOL: Alcohol, Ethyl (B): <10 mg/dL (ref <10)

## 2018-07-12 LAB — AMMONIA: Ammonia: 17 umol/L (ref 9–35)

## 2018-07-12 LAB — TROPONIN I: Troponin I: 0.03 ng/mL (ref <0.03)

## 2018-07-12 LAB — TSH: TSH: 2.684 u[IU]/mL (ref 0.350–4.500)

## 2018-07-12 MED ORDER — IOHEXOL 300 MG/ML  SOLN
95.0000 mL | Freq: Once | INTRAMUSCULAR | Status: AC | PRN
  Administered 2018-07-12: 95 mL via INTRAVENOUS

## 2018-07-12 MED ORDER — SODIUM CHLORIDE 0.9 % IV BOLUS
1000.0000 mL | Freq: Once | INTRAVENOUS | Status: AC
  Administered 2018-07-12: 1000 mL via INTRAVENOUS

## 2018-07-12 MED ORDER — BISACODYL 10 MG RE SUPP: 10.0000 mg | RECTAL | 0 refills | Status: DC | PRN

## 2018-07-12 NOTE — ED Notes (Signed)
Called [facility] 

## 2018-07-12 NOTE — ED Provider Notes (Signed)
[facility] EMERGENCY DEPARTMENT Provider Note   CSN: [id number] Arrival date & time: 07/12/18  1534    History   Chief Complaint Chief Complaint  Patient presents with  . Altered Mental Status    HPI Sarah Gutierrez is a 73 y.o. female.  She is brought in by EMS for evaluation of confusion.  Unclear time of onset but it sounds like she was confused when she woke up this morning.  Unclear of her baseline level of mental status.  Level 5 caveat secondary to altered mental status.  Patient denies any complaints and is unsure why she is here.  She does not know where she is.  She had admission last month for altered mental status and it was attributed to alcohol dependence and may be some element of encephalopathy.     The history is provided by the patient and the EMS personnel.  Altered Mental Status  Presenting symptoms: confusion, disorientation and memory loss   Severity:  Moderate Most recent episode:  Today Episode history:  Unable to specify Timing:  Constant Progression:  Unchanged Chronicity:  Recurrent Context: alcohol use (? current)   Context: not homeless   Associated symptoms: abdominal pain (?)   Associated symptoms: no fever     Past Medical History:  Diagnosis Date  . Hypertension     Patient Active Problem List   Diagnosis Date Noted  . Protein-calorie malnutrition, severe 05/29/2018  . Pressure injury of skin 05/29/2018  . AKI (acute kidney injury) ([place])   . Acute metabolic encephalopathy 05/23/3233  . Dehydration 05/20/2018  . Hypercalcemia 05/20/2018  . Hypernatremia 05/20/2018  . Lactic acid acidosis 05/20/2018  . Elevated troponin 05/20/2018  . Hypertension     No past surgical history on file.   OB History   No obstetric history on file.      Home Medications    Prior to Admission medications   Medication Sig Start Date End Date Taking? Authorizing Provider  amLODipine (NORVASC) 5 MG tablet Take 1 tablet (5 mg  total) by mouth daily. 05/30/18   Dessa Phi, DO  folic acid (FOLVITE) 1 MG tablet Take 1 tablet (1 mg total) by mouth daily. 05/30/18   Dessa Phi, DO  polyethylene glycol (MIRALAX / GLYCOLAX) packet Take 17 g by mouth daily. 06/01/18   Dessa Phi, DO  senna-docusate (SENOKOT-S) 8.6-50 MG tablet Take 1 tablet by mouth daily. 06/01/18   Dessa Phi, DO  thiamine 100 MG tablet Take 1 tablet (100 mg total) by mouth daily. 05/30/18   Dessa Phi, DO    Family History Family History  Problem Relation Age of Onset  . CAD Brother 66       has coronary stent     Social History Social History   Tobacco Use  . Smoking status: Not on file  Substance Use Topics  . 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 Drug use: Not on file     Allergies   Patient has no known allergies.   Review of Systems Review of Systems  Unable to perform ROS: Mental status change  Constitutional: Negative for fever.  Gastrointestinal: Positive for abdominal pain (?).  Psychiatric/Behavioral: Positive for confusion and memory loss.     Physical Exam Updated Vital Signs BP (!) 177/104 (BP Location: Right Arm)   Pulse 95   Resp 12   SpO2 100%   Physical Exam Vitals signs and nursing note reviewed.  Constitutional:      General: She is not in acute distress.    Appearance: She is underweight.  HENT:     Head: Normocephalic and atraumatic.  Eyes:     Conjunctiva/sclera: Conjunctivae normal.  Neck:     Musculoskeletal: Normal range of motion and neck supple.  Cardiovascular:     Rate and Rhythm: Regular rhythm. Tachycardia present.     Heart sounds: No murmur.  Pulmonary:     Effort: Pulmonary effort is normal. No respiratory distress.     Breath sounds: Normal breath sounds.  Abdominal:     Palpations: Abdomen is soft.     Tenderness: There is no abdominal tenderness. There is no guarding or rebound.  Musculoskeletal: Normal range of motion.     Right lower  leg: No edema.     Left lower leg: No edema.  Skin:    General: Skin is warm and dry.     Capillary Refill: Capillary refill takes less than 2 seconds.  Neurological:     General: No focal deficit present.     Mental Status: She is alert.     Motor: No weakness.     Comments: Patient is alert but not oriented to time or place.  She is moving all extremities non-focally.  Psychiatric:        Behavior: Behavior is cooperative.      ED Treatments / Results  Labs (all labs ordered are listed, but only abnormal results are displayed) Labs Reviewed  CBC WITH DIFFERENTIAL/PLATELET - Abnormal; Notable for the following components:      Result Value   RBC 3.76 (*)    MCV 102.4 (*)    Platelets 604 (*)    All other components within normal limits  COMPREHENSIVE METABOLIC PANEL - Abnormal; Notable for the following components:   Glucose, Bld 102 (*)    Total Protein 6.1 (*)    Albumin 2.5 (*)    All other components within normal limits  TROPONIN I - Abnormal; Notable for the following components:   Troponin I 0.03 (*)    All other components within normal limits  AMMONIA  ETHANOL  TSH  URINALYSIS, ROUTINE W REFLEX MICROSCOPIC  CBG MONITORING, ED    EKG EKG Interpretation  Date/Time:  Friday July 12 2018 15:42:20 EST Ventricular Rate:  106 PR Interval:    QRS Duration: 53 QT Interval:  325 QTC Calculation: 432 R Axis:   74 Text Interpretation:  Sinus tachycardia Multiple premature complexes, vent & supraven Aberrant conduction of SV complex(es) Probable left atrial enlargement similar to prior 1/20 Confirmed by Aletta Edouard [phone]) on 07/12/2018 3:55:41 PM   Radiology Dg Chest 1 View  Result Date: 07/12/2018 CLINICAL DATA:  73 y/o  F; confusion. EXAM: CHEST  1 VIEW COMPARISON:  None. FINDINGS: Normal cardiac silhouette. Aortic atherosclerosis with calcification. Clear lungs. No pleural effusion or pneumothorax. Left 7-8 posterior mildly displaced acute rib  fractures. IMPRESSION: 1. No acute pulmonary process identified. 2. Left 7-8 posterior mildly displaced acute rib fractures. Electronically Signed   By: Kristine Garbe M.D.   On: 07/12/2018 16:36   Ct Head Wo Contrast  Result Date: 07/12/2018 CLINICAL DATA:  Confusion. EXAM: CT HEAD WITHOUT CONTRAST TECHNIQUE: Contiguous axial images were obtained from the base of the skull through the vertex without intravenous contrast. COMPARISON:  05/20/2018 FINDINGS: Brain: Motion artifact limits assessment, and multiple separate acquisitions were performed. Motion free images do not include the entirety of the brain at the  level of the basal ganglia and lateral ventricles. No acute infarct, intracranial hemorrhage, mass, midline shift, or extra-axial fluid collection is identified within these limitations. Cerebral atrophy is mild for age. Cerebral white matter hypodensities are similar to the prior study and nonspecific but compatible with mild chronic small vessel ischemic disease. Vascular: Calcified atherosclerosis at the skull base. No gross hyperdense vessel. Skull: No fracture. Unchanged nonspecific 5 mm lucency in the left parietal bone. Sinuses/Orbits: Partially visualized mucosal thickening in the maxillary sinuses. More extensive mucosal thickening and fluid in the frontal, ethmoid, and sphenoid sinuses bilaterally. Clear mastoid air cells. Unremarkable orbits. Other: None. IMPRESSION: 1. Limited examination due to motion. No acute intracranial abnormality identified. 2. Mild chronic small vessel ischemic disease. 3. Pansinusitis. Electronically Signed   By: Logan Bores M.D.   On: 07/12/2018 18:23   Ct Abdomen Pelvis W Contrast  Result Date: 07/12/2018 CLINICAL DATA:  Abdominal pain EXAM: CT ABDOMEN AND PELVIS WITH CONTRAST TECHNIQUE: Multidetector CT imaging of the abdomen and pelvis was performed using the standard protocol following bolus administration of intravenous contrast. CONTRAST:  31mL  OMNIPAQUE IOHEXOL 300 MG/ML  SOLN COMPARISON:  05/21/2018 FINDINGS: Lower chest: No acute abnormality Hepatobiliary: No focal hepatic abnormality. Gallbladder unremarkable. Pancreas: No focal abnormality or ductal dilatation. Spleen: No focal abnormality.  Normal size. Adrenals/Urinary Tract: No adrenal abnormality. No focal renal abnormality. No stones or hydronephrosis. Urinary bladder is unremarkable. Stomach/Bowel: Large stool burden throughout the colon, much increased since prior study. This is most prominent in the rectosigmoid colon and in the right colon. Cannot exclude fecal impaction. Stomach and small bowel decompressed. Vascular/Lymphatic: Aortic atherosclerosis. No enlarged abdominal or pelvic lymph nodes. Reproductive: Uterus and adnexa unremarkable.  No mass. Other: Trace free fluid in the pelvis.  No free air. Musculoskeletal: No acute bony abnormality. IMPRESSION: Large stool burden throughout the colon, particularly rectosigmoid colon. Cannot exclude fecal impaction. Aortic atherosclerosis. Electronically Signed   By: Rolm Baptise M.D.   On: 07/12/2018 18:26    Procedures Procedures (including critical care time)  Medications Ordered in ED Medications  sodium chloride 0.9 % bolus 1,000 mL (0 mLs Intravenous Stopped 07/12/18 1908)  iohexol (OMNIPAQUE) 300 MG/ML solution 95 mL (95 mLs Intravenous Contrast Given 07/12/18 1823)     Initial Impression / Assessment and Plan / ED Course  I have reviewed the triage vital signs and the nursing notes.  Pertinent labs & imaging results that were available during my care of the patient were reviewed by me and considered in my medical decision making (see chart for details).  Clinical Course as of Jul 12 2328  Fri Jul 13, 6831  6238 73 year old female brought in by ambulance from her facility for evaluation of altered mental status.  It is unclear what her baseline is as she was admitted in late January for altered mental status and when  discharge she was alert and oriented x1.  Patient initially said she had some abdominal pain earlier but then could not seem to remember it again.  She is getting some screening labs urinalysis chest x-ray head CT.   [MB]  1713 Patient's chest x-ray reading comment upon some left posterior rib fractures that look acute.  Examined the patient and I do not see any bruising and she has no tenderness there.  She does not recall any recent falls although her history is unreliable.   [MB]  1801 Nurse was able to reach out to the patient's son.  His understanding is that the  patient is here for having some abdominal pain elevated blood pressures today at her facility at [facility].  The altered mental status is baseline for her currently.   [MB]  1916 Rectal exam done with nurse as chaperone.  She has soft stool in the vault.  She is on a nursing facility and so I think it is reasonable to send her back to her facility for continued management of her constipation symptoms.   [MB]  1917 Her troponin slightly elevated but is better than during her last admission.  There is no indication for any chest pain or otherwise ischemic symptoms.   [MB]    Clinical Course User Index [MB] Hayden Rasmussen, MD        Final Clinical Impressions(s) / ED Diagnoses   Final diagnoses:  Disorientation  Generalized abdominal pain  Constipation, unspecified constipation type    ED Discharge Orders         Ordered    bisacodyl (DULCOLAX) 10 MG suppository  As needed     07/12/18 1919           Hayden Rasmussen, MD 07/12/18 [phone]

## 2018-07-12 NOTE — ED Notes (Signed)
Report given to [facility] RN

## 2018-07-12 NOTE — Discharge Instructions (Addendum)
Ms. Orne was evaluated in the emergency department for elevated blood pressure and abdominal pain.  She had no recollection that she had abdominal pain and denies any complaints here.  Her lab work was fairly unremarkable and her CAT scan showed a lot of stool in her colon.  We are prescribing her some rectal suppositories and she will need a bowel regimen.  Please return if any concerns.  She was not oriented to time or place or condition here but after reviewing this with her son it sounds like this is her baseline.

## 2018-07-12 NOTE — ED Notes (Signed)
Patient transported to CT 

## 2018-07-12 NOTE — ED Triage Notes (Signed)
Pt arrives by [facility] from [facility] where ems reports being called out for pt being confused since waking up this morning no clear last seen normal. Pt is currently in this facility for recovery from alcohol abuse. Pt is alert but confused on name, place and situation.

## 2018-07-12 NOTE — ED Notes (Signed)
Pt is from [place]

## 2018-07-15 DIAGNOSIS — R339 Retention of urine, unspecified: Secondary | ICD-10-CM | POA: Diagnosis not present

## 2018-07-15 DIAGNOSIS — G9340 Encephalopathy, unspecified: Secondary | ICD-10-CM | POA: Diagnosis not present

## 2018-07-15 DIAGNOSIS — R4182 Altered mental status, unspecified: Secondary | ICD-10-CM | POA: Diagnosis not present

## 2018-07-15 DIAGNOSIS — K5900 Constipation, unspecified: Secondary | ICD-10-CM | POA: Diagnosis not present

## 2018-07-15 DIAGNOSIS — I10 Essential (primary) hypertension: Secondary | ICD-10-CM | POA: Diagnosis not present

## 2018-07-15 DIAGNOSIS — N178 Other acute kidney failure: Secondary | ICD-10-CM | POA: Diagnosis not present

## 2018-07-16 DIAGNOSIS — K5900 Constipation, unspecified: Secondary | ICD-10-CM | POA: Diagnosis not present

## 2018-07-16 DIAGNOSIS — I10 Essential (primary) hypertension: Secondary | ICD-10-CM | POA: Diagnosis not present

## 2018-07-16 DIAGNOSIS — N178 Other acute kidney failure: Secondary | ICD-10-CM | POA: Diagnosis not present

## 2018-07-16 DIAGNOSIS — R339 Retention of urine, unspecified: Secondary | ICD-10-CM | POA: Diagnosis not present

## 2018-07-16 DIAGNOSIS — G9340 Encephalopathy, unspecified: Secondary | ICD-10-CM | POA: Diagnosis not present

## 2018-07-16 DIAGNOSIS — R4182 Altered mental status, unspecified: Secondary | ICD-10-CM | POA: Diagnosis not present

## 2018-07-17 DIAGNOSIS — G9340 Encephalopathy, unspecified: Secondary | ICD-10-CM | POA: Diagnosis not present

## 2018-07-17 DIAGNOSIS — W19XXXD Unspecified fall, subsequent encounter: Secondary | ICD-10-CM | POA: Diagnosis not present

## 2018-07-17 DIAGNOSIS — R339 Retention of urine, unspecified: Secondary | ICD-10-CM | POA: Diagnosis not present

## 2018-07-17 DIAGNOSIS — K5900 Constipation, unspecified: Secondary | ICD-10-CM | POA: Diagnosis not present

## 2018-07-17 DIAGNOSIS — N178 Other acute kidney failure: Secondary | ICD-10-CM | POA: Diagnosis not present

## 2018-07-17 DIAGNOSIS — R4182 Altered mental status, unspecified: Secondary | ICD-10-CM | POA: Diagnosis not present

## 2018-07-17 DIAGNOSIS — K5901 Slow transit constipation: Secondary | ICD-10-CM | POA: Diagnosis not present

## 2018-07-17 DIAGNOSIS — F419 Anxiety disorder, unspecified: Secondary | ICD-10-CM | POA: Diagnosis not present

## 2018-07-17 DIAGNOSIS — I10 Essential (primary) hypertension: Secondary | ICD-10-CM | POA: Diagnosis not present

## 2018-07-19 DIAGNOSIS — G9340 Encephalopathy, unspecified: Secondary | ICD-10-CM | POA: Diagnosis not present

## 2018-07-19 DIAGNOSIS — R4182 Altered mental status, unspecified: Secondary | ICD-10-CM | POA: Diagnosis not present

## 2018-07-19 DIAGNOSIS — N178 Other acute kidney failure: Secondary | ICD-10-CM | POA: Diagnosis not present

## 2018-07-19 DIAGNOSIS — K5901 Slow transit constipation: Secondary | ICD-10-CM | POA: Diagnosis not present

## 2018-07-19 DIAGNOSIS — R339 Retention of urine, unspecified: Secondary | ICD-10-CM | POA: Diagnosis not present

## 2018-07-19 DIAGNOSIS — F419 Anxiety disorder, unspecified: Secondary | ICD-10-CM | POA: Diagnosis not present

## 2018-07-19 DIAGNOSIS — I10 Essential (primary) hypertension: Secondary | ICD-10-CM | POA: Diagnosis not present

## 2018-07-19 DIAGNOSIS — K5900 Constipation, unspecified: Secondary | ICD-10-CM | POA: Diagnosis not present

## 2018-07-21 DIAGNOSIS — R4182 Altered mental status, unspecified: Secondary | ICD-10-CM | POA: Diagnosis not present

## 2018-07-21 DIAGNOSIS — R339 Retention of urine, unspecified: Secondary | ICD-10-CM | POA: Diagnosis not present

## 2018-07-21 DIAGNOSIS — K5900 Constipation, unspecified: Secondary | ICD-10-CM | POA: Diagnosis not present

## 2018-07-21 DIAGNOSIS — I10 Essential (primary) hypertension: Secondary | ICD-10-CM | POA: Diagnosis not present

## 2018-07-21 DIAGNOSIS — N178 Other acute kidney failure: Secondary | ICD-10-CM | POA: Diagnosis not present

## 2018-07-21 DIAGNOSIS — G9340 Encephalopathy, unspecified: Secondary | ICD-10-CM | POA: Diagnosis not present

## 2018-07-22 DIAGNOSIS — G9340 Encephalopathy, unspecified: Secondary | ICD-10-CM | POA: Diagnosis not present

## 2018-07-22 DIAGNOSIS — N178 Other acute kidney failure: Secondary | ICD-10-CM | POA: Diagnosis not present

## 2018-07-22 DIAGNOSIS — I10 Essential (primary) hypertension: Secondary | ICD-10-CM | POA: Diagnosis not present

## 2018-07-22 DIAGNOSIS — R339 Retention of urine, unspecified: Secondary | ICD-10-CM | POA: Diagnosis not present

## 2018-07-22 DIAGNOSIS — K5900 Constipation, unspecified: Secondary | ICD-10-CM | POA: Diagnosis not present

## 2018-07-22 DIAGNOSIS — R4182 Altered mental status, unspecified: Secondary | ICD-10-CM | POA: Diagnosis not present

## 2018-07-23 DIAGNOSIS — R339 Retention of urine, unspecified: Secondary | ICD-10-CM | POA: Diagnosis not present

## 2018-07-23 DIAGNOSIS — R4182 Altered mental status, unspecified: Secondary | ICD-10-CM | POA: Diagnosis not present

## 2018-07-23 DIAGNOSIS — I10 Essential (primary) hypertension: Secondary | ICD-10-CM | POA: Diagnosis not present

## 2018-07-23 DIAGNOSIS — N178 Other acute kidney failure: Secondary | ICD-10-CM | POA: Diagnosis not present

## 2018-07-23 DIAGNOSIS — K5900 Constipation, unspecified: Secondary | ICD-10-CM | POA: Diagnosis not present

## 2018-07-23 DIAGNOSIS — G9340 Encephalopathy, unspecified: Secondary | ICD-10-CM | POA: Diagnosis not present

## 2018-07-24 DIAGNOSIS — R451 Restlessness and agitation: Secondary | ICD-10-CM | POA: Diagnosis not present

## 2018-07-24 DIAGNOSIS — R4182 Altered mental status, unspecified: Secondary | ICD-10-CM | POA: Diagnosis not present

## 2018-07-24 DIAGNOSIS — F419 Anxiety disorder, unspecified: Secondary | ICD-10-CM | POA: Diagnosis not present

## 2018-07-24 DIAGNOSIS — R296 Repeated falls: Secondary | ICD-10-CM | POA: Diagnosis not present

## 2018-07-25 DIAGNOSIS — N178 Other acute kidney failure: Secondary | ICD-10-CM | POA: Diagnosis not present

## 2018-07-25 DIAGNOSIS — I10 Essential (primary) hypertension: Secondary | ICD-10-CM | POA: Diagnosis not present

## 2018-07-25 DIAGNOSIS — G9340 Encephalopathy, unspecified: Secondary | ICD-10-CM | POA: Diagnosis not present

## 2018-07-25 DIAGNOSIS — K5900 Constipation, unspecified: Secondary | ICD-10-CM | POA: Diagnosis not present

## 2018-07-25 DIAGNOSIS — R339 Retention of urine, unspecified: Secondary | ICD-10-CM | POA: Diagnosis not present

## 2018-07-25 DIAGNOSIS — R4182 Altered mental status, unspecified: Secondary | ICD-10-CM | POA: Diagnosis not present

## 2018-07-30 DIAGNOSIS — R339 Retention of urine, unspecified: Secondary | ICD-10-CM | POA: Diagnosis not present

## 2018-07-30 DIAGNOSIS — I10 Essential (primary) hypertension: Secondary | ICD-10-CM | POA: Diagnosis not present

## 2018-07-30 DIAGNOSIS — G9340 Encephalopathy, unspecified: Secondary | ICD-10-CM | POA: Diagnosis not present

## 2018-07-30 DIAGNOSIS — K5900 Constipation, unspecified: Secondary | ICD-10-CM | POA: Diagnosis not present

## 2018-07-30 DIAGNOSIS — N178 Other acute kidney failure: Secondary | ICD-10-CM | POA: Diagnosis not present

## 2018-07-30 DIAGNOSIS — R4182 Altered mental status, unspecified: Secondary | ICD-10-CM | POA: Diagnosis not present

## 2018-07-31 ENCOUNTER — Other Ambulatory Visit: Payer: Medicare Other

## 2018-07-31 ENCOUNTER — Encounter: Payer: Self-pay | Admitting: Neurology

## 2018-07-31 ENCOUNTER — Ambulatory Visit (INDEPENDENT_AMBULATORY_CARE_PROVIDER_SITE_OTHER): Payer: Medicare Other | Admitting: Neurology

## 2018-07-31 ENCOUNTER — Other Ambulatory Visit: Payer: Self-pay

## 2018-07-31 VITALS — BP 126/72 | HR 85 | Temp 98.3°F | Ht 68.0 in

## 2018-07-31 DIAGNOSIS — R253 Fasciculation: Secondary | ICD-10-CM | POA: Diagnosis not present

## 2018-07-31 DIAGNOSIS — F03B18 Unspecified dementia, moderate, with other behavioral disturbance: Secondary | ICD-10-CM

## 2018-07-31 DIAGNOSIS — R413 Other amnesia: Secondary | ICD-10-CM | POA: Diagnosis not present

## 2018-07-31 DIAGNOSIS — F0391 Unspecified dementia with behavioral disturbance: Secondary | ICD-10-CM | POA: Diagnosis not present

## 2018-07-31 DIAGNOSIS — K5901 Slow transit constipation: Secondary | ICD-10-CM | POA: Diagnosis not present

## 2018-07-31 DIAGNOSIS — F04 Amnestic disorder due to known physiological condition: Secondary | ICD-10-CM

## 2018-07-31 DIAGNOSIS — F419 Anxiety disorder, unspecified: Secondary | ICD-10-CM | POA: Diagnosis not present

## 2018-07-31 DIAGNOSIS — R296 Repeated falls: Secondary | ICD-10-CM | POA: Diagnosis not present

## 2018-07-31 DIAGNOSIS — I10 Essential (primary) hypertension: Secondary | ICD-10-CM | POA: Diagnosis not present

## 2018-07-31 MED ORDER — SERTRALINE HCL 25 MG PO TABS: 25.0000 mg | ORAL_TABLET | Freq: Every day | ORAL | 3 refills | Status: DC

## 2018-07-31 NOTE — Progress Notes (Signed)
NEUROLOGY CONSULTATION NOTE  Sarah Gutierrez MRN: [id number] DOB: 01/05/1946  Referring provider: Dr. London Pepper Primary care provider: Dr. London Pepper  Reason for consult:  Altered mental status  Dear Dr Orland Mustard:  Thank you for your kind referral of Sarah Gutierrez for consultation of the above symptoms. Although her history is well known to you, please allow me to reiterate it for the purpose of our medical record. The patient was accompanied to the clinic by her brother Dr. Harvel Quale and his wife who also provide collateral information. Records and images were personally reviewed where available.  HISTORY OF PRESENT ILLNESS: This is a pleasant 73 year old right-handed woman with a history of hypertension, remote alcohol abuse, presenting for evaluation of continued mental status changes. Records from her hospitalization and report from her brother were reviewed, this appears to be quite an acute change for her. She had been living alone independently with family checking in on regularly. She apparently did not answer phone calls since Christmas so her brother called law enforcement on 05/20/2018 for a wellness check. They found her on the couch, non-verbal, following only basic commands. There was half a can of beer without other alcohol noted. She was non-verbal and not following commands in the ER.  Bloodwork showed sodium of 155, calcium 12, WBC 10.8, lactic acid 5.33, negative UA, EtOH level <10, ammonia 20, CD 53, BUN 58, creatinine 1.68. Head CT no acute changes. B1 level was low 28.3. 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 She was discharged to rehab mid-January then moved to [facility] SNF because she still has not returned to baseline. Her brother provided additional information that she had been sober  for many years but family feels she has recently started drinking beer again within the past 6 months. Her brother found a case of beer in the pantry and found 4-6 empty cans in her living room. Prior to hospitalization, she was cognitively pretty good but not totally normal. She has a Oceanographer in Nutritional [employer]. As far as they knew, she had been up to date with bill payments, very active walking 3 miles daily, driving, doing all her yardwork. She has never been very social. Now she does not know what the year or day of the week is, and confabulates quite a bit. He feels she may have had mild confabulation prior. She is aware that she cannot come up with the right words.  He notes she has some nystagmus and tremor that is new, and difficulty with gait and balance. He has not noticed any personality changes, she is very pleasant.   During her visit, she appears to answer questions appropriately but has word-finding difficulties and slow to respond, having to think about her answers. She reports occasional headaches mostly behind her left eye, sometimes affecting her vision. She reports vertical diplopia. She has dizziness when standing. She has numbness over her left wrist. She denies any bowel/bladder incontinence, but family reports she wears Depends. There have been 2 instances where they came to [facility] to find her caked with feces, last week she was on the floor covered with feces (but with a clean diaper). She was previously on Ativan TID, this was discontinued 3/6 but she has been receiving prn Ativan once a day for agitation and restlessness, wanting to get up. Family has not  seen the agitation that SNF describes. They report she is not sleeping and wanting to get up. She is very unsteady, PT has recommended wheelchair only, she would fall back when standing. Family denies any hallucinations. He reports an 11-lb weight loss at the SNF because she was refusing food, after family  intervened, this has improved. Strength is better than 6 weeks ago.   PAST MEDICAL HISTORY: Past Medical History:  Diagnosis Date   Hypertension     PAST SURGICAL HISTORY: History reviewed. No pertinent surgical history.  MEDICATIONS: Current Outpatient Medications on File Prior to Visit  Medication Sig Dispense Refill   amLODipine (NORVASC) 5 MG tablet Take 1 tablet (5 mg total) by mouth daily. 30 tablet 0   bisacodyl (DULCOLAX) 10 MG suppository Place 1 suppository (10 mg total) rectally as needed for moderate constipation. 12 suppository 0   Cholecalciferol 1.25 MG (50000 UT) capsule Take 50,000 Units by mouth every Tuesday.     docusate sodium (COLACE) 100 MG capsule Take 100 mg by mouth at bedtime.     folic acid (FOLVITE) 1 MG tablet Take 1 tablet (1 mg total) by mouth daily. 30 tablet 0   LORazepam (ATIVAN) 0.5 MG tablet Take 0.5 mg by mouth 3 (three) times daily.     Multiple Vitamins-Minerals (MULTIVITAMIN PO) Take 1 tablet by mouth daily.     polyethylene glycol (MIRALAX / GLYCOLAX) packet Take 17 g by mouth daily. 14 each 0   senna-docusate (SENOKOT-S) 8.6-50 MG tablet Take 1 tablet by mouth daily. 30 tablet 0   thiamine 100 MG tablet Take 1 tablet (100 mg total) by mouth daily. 30 tablet 0   valACYclovir (VALTREX) 1000 MG tablet Take 1,000 mg by mouth every 12 (twelve) hours. For 10 days     No current facility-administered medications on file prior to visit.     ALLERGIES: No Known Allergies  FAMILY HISTORY: Family History  Problem Relation Age of Onset   CAD Brother 59       has coronary stent     SOCIAL HISTORY: Social History   Socioeconomic History   Marital status: Married    Spouse name: Not on file   Number of children: Not on file   Years of education: Not on file   Highest education level: Not on file  Occupational History   Not on file  Social Needs   Financial resource strain: Not on file   Food insecurity:    Worry: Not  on file    Inability: Not on file   Transportation needs:    Medical: Not on file    Non-medical: Not on file  Tobacco Use   Smoking status: Not on file  Substance and Sexual Activity   Alcohol use: Yes    Comment: hx alcohol abuse per family   Drug use: Not on file   Sexual activity: Not on file  Lifestyle   Physical activity:    Days per week: Not on file    Minutes per session: Not on file   Stress: Not on file  Relationships   Social connections:    Talks on phone: Not on file    Gets together: Not on file    Attends religious service: Not on file    Active member of club or organization: Not on file    Attends meetings of clubs or organizations: Not on file    Relationship status: Not on file   Intimate partner violence:    Fear  of current or ex partner: Not on file    Emotionally abused: Not on file    Physically abused: Not on file    Forced sexual activity: Not on file  Other Topics Concern   Not on file  Social History Narrative   Pt is R handed   Lives in nursing facility - [facility]   Pt has 1 child   Masters degree in teaching   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    REVIEW OF SYSTEMS: Constitutional: No fevers, chills, or sweats, no generalized fatigue, change in appetite Eyes: No visual changes, double vision, eye pain Ear, nose and throat: No hearing loss, ear pain, nasal congestion, sore throat Cardiovascular: No chest pain, palpitations Respiratory:  No shortness of breath at rest or with exertion, wheezes GastrointestinaI: No nausea, vomiting, diarrhea, abdominal pain, fecal incontinence Genitourinary:  No dysuria, urinary retention or frequency Musculoskeletal:  No neck pain, back pain Integumentary: No rash, pruritus, skin lesions Neurological: as above Psychiatric: No depression, insomnia, anxiety Endocrine: No palpitations, fatigue, diaphoresis, mood swings, change in appetite, change in weight, increased  thirst Hematologic/Lymphatic:  No anemia, purpura, petechiae. Allergic/Immunologic: no itchy/runny eyes, nasal congestion, recent allergic reactions, rashes  PHYSICAL EXAM: Vitals:   07/31/18 1409  BP: 126/72  Pulse: 85  Temp: 98.3 F (36.8 C)  SpO2: 99%   General: No acute distress, pleasant, sitting on wheelchair Head:  Normocephalic/atraumatic Eyes: Fundoscopic exam shows bilateral sharp discs, no vessel changes, exudates, or hemorrhages Neck: supple, no paraspinal tenderness, full range of motion Back: No paraspinal tenderness Heart: regular rate and rhythm Lungs: Clear to auscultation bilaterally. Vascular: No carotid bruits. Skin/Extremities: No rash, no edema Neurological Exam: Mental status: alert and oriented to person, city/state, says it is 1997, Sunday, summer. No dysarthria or aphasia, Fund of knowledge is reduced, she appears to confabulate about her recent hospital stay. Recent and remote memory are impaired. Attention and concentration are reduced. Difficulty with naming watch and pen. Able to repeat.CDT 0/5 MMSE - Mini Mental State Exam 07/31/2018  Orientation to time 0  Orientation to Place 3  Registration 3  Attention/ Calculation 2  Recall 0  Language- name 2 objects 0  Language- repeat 1  Language- follow 3 step command 2  Language- read & follow direction 1  Write a sentence 1  Copy design 0  Total score 13   Cranial nerves: CN I: not tested CN II: pupils equal, round and reactive to light, visual fields intact, fundi unremarkable. CN III, IV, VI:  full range of motion, no ptosis. She has quite noticeable vertical nystagmus on primary gaze, less nystagmus on all directions of gaze but still present CN V: facial sensation intact CN VII: upper and lower face symmetric CN VIII: hearing intact to finger rub CN IX, X: gag intact, uvula midline CN XI: sternocleidomastoid and trapezius muscles intact CN XII: tongue midline Bulk & Tone: normal, she is  tremulous L>R hand, but also noted to have fasciculations on both UE and LE Motor: 5/5 throughout with no pronator drift. Sensation: intact to light touch, cold, pin, vibration and joint position sense.  No extinction to double simultaneous stimulation. She is significantly ataxic on standing even with eyes open, falling back without support Deep Tendon Reflexes: brisk +2 throughout, no ankle clonus, negative Hoffman sign Plantar responses: downgoing bilaterally Cerebellar: no incoordination on finger to nose, heel to shin. No dysdiadochokinesia Gait: takes 2-3 steps with 1-person assist, slow and cautious Tremor: left hand tremor  IMPRESSION: This is a pleasant 73 year old right-handed woman with a history of  history of hypertension, remote alcohol abuse, presenting for evaluation of continued mental status changes. Her brother reports resuming alcohol intake again the past 6 months. She was admitted for acute mental status change last January 2020 and continues to remain encephalopathic with nystagmus and gait ataxia. 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 Her MMSE today is 13/30. MRI brain with and without contrast will be helpful to further evaluate for typical findings in WE. Additional bloodwork for rapidly progressive dementias will be ordered, as well as repeat thiamine level. She is also noted to have fasciculations on exam, EMG/NCV of the left arm and leg will be ordered to further evaluate. We discussed minimizing benzodiazepine use, she will be started on Zoloft 25mg  daily. Continue 24/7 supervision. She is not driving. Follow-up after tests, they know to call for any changes.   Thank you for allowing me to participate in the care of this patient. Please do not hesitate to call for any questions or concerns.   Ellouise Newer, M.D.  CC: Dr. Orland Mustard

## 2018-07-31 NOTE — Patient Instructions (Addendum)
1. Schedule MRI brain with and without contrast  We have sent a referral to [facility] for your MRI and they will call you directly to schedule your appt. They are located at [place]. If you need to contact them directly please call [phone].   2. Bloodwork for BUN, creatinine, B1, RPR,ESR, CRP, ANA, anti-thyroglobulin antibodies, anti-thyroid peroxidase antibodies  Your provider requests that you have LABS drawn today.  We share a lab with [facility] - they are located in [address] (second floor) of this building.  Once you get there, please have a seat and the phlebotomist will call your name.  If you have waited more than 15 minutes, please advise the front desk   3. Schedule EMG/NCV of the left UE and left LE  4. Start Zoloft 64m daily. Start weaning off as needed Ativan  5. Continue 24/7 supervision  6. Follow-up after tests, call for any changes

## 2018-08-01 ENCOUNTER — Encounter: Payer: Self-pay | Admitting: Neurology

## 2018-08-01 DIAGNOSIS — R339 Retention of urine, unspecified: Secondary | ICD-10-CM | POA: Diagnosis not present

## 2018-08-01 DIAGNOSIS — I10 Essential (primary) hypertension: Secondary | ICD-10-CM | POA: Diagnosis not present

## 2018-08-01 DIAGNOSIS — K5900 Constipation, unspecified: Secondary | ICD-10-CM | POA: Diagnosis not present

## 2018-08-01 DIAGNOSIS — N178 Other acute kidney failure: Secondary | ICD-10-CM | POA: Diagnosis not present

## 2018-08-01 DIAGNOSIS — R4182 Altered mental status, unspecified: Secondary | ICD-10-CM | POA: Diagnosis not present

## 2018-08-01 DIAGNOSIS — G9340 Encephalopathy, unspecified: Secondary | ICD-10-CM | POA: Diagnosis not present

## 2018-08-01 LAB — BUN/CREATININE RATIO
BUN/Creatinine Ratio: 9 (calc) (ref 6–22)
BUN: 6 mg/dL — ABNORMAL LOW (ref 7–25)
Creat: 0.64 mg/dL (ref 0.60–0.93)
GFR, Est African American: 103 mL/min/{1.73_m2} (ref >=60)
GFR, Est Non African American: 89 mL/min/{1.73_m2} (ref >=60)

## 2018-08-01 LAB — THYROGLOBULIN LEVEL: Thyroglobulin: 9.4 ng/mL

## 2018-08-01 LAB — THYROID PEROXIDASE ANTIBODY: Thyroperoxidase Ab SerPl-aCnc: <1 IU/mL (ref <9)

## 2018-08-01 LAB — RPR: RPR Ser Ql: NONREACTIVE

## 2018-08-01 LAB — ANA: Anti Nuclear Antibody (ANA): NEGATIVE

## 2018-08-01 LAB — C-REACTIVE PROTEIN: CRP: 1.2 mg/L (ref <8.0)

## 2018-08-02 ENCOUNTER — Telehealth: Payer: Self-pay

## 2018-08-02 DIAGNOSIS — G9340 Encephalopathy, unspecified: Secondary | ICD-10-CM | POA: Diagnosis not present

## 2018-08-02 DIAGNOSIS — K5900 Constipation, unspecified: Secondary | ICD-10-CM | POA: Diagnosis not present

## 2018-08-02 DIAGNOSIS — R4182 Altered mental status, unspecified: Secondary | ICD-10-CM | POA: Diagnosis not present

## 2018-08-02 DIAGNOSIS — N178 Other acute kidney failure: Secondary | ICD-10-CM | POA: Diagnosis not present

## 2018-08-02 DIAGNOSIS — R339 Retention of urine, unspecified: Secondary | ICD-10-CM | POA: Diagnosis not present

## 2018-08-02 DIAGNOSIS — I10 Essential (primary) hypertension: Secondary | ICD-10-CM | POA: Diagnosis not present

## 2018-08-05 ENCOUNTER — Ambulatory Visit: Payer: Medicare Other | Admitting: Neurology

## 2018-08-05 DIAGNOSIS — K5900 Constipation, unspecified: Secondary | ICD-10-CM | POA: Diagnosis not present

## 2018-08-05 DIAGNOSIS — R4182 Altered mental status, unspecified: Secondary | ICD-10-CM | POA: Diagnosis not present

## 2018-08-05 DIAGNOSIS — R339 Retention of urine, unspecified: Secondary | ICD-10-CM | POA: Diagnosis not present

## 2018-08-05 DIAGNOSIS — N178 Other acute kidney failure: Secondary | ICD-10-CM | POA: Diagnosis not present

## 2018-08-05 DIAGNOSIS — I10 Essential (primary) hypertension: Secondary | ICD-10-CM | POA: Diagnosis not present

## 2018-08-05 DIAGNOSIS — G9340 Encephalopathy, unspecified: Secondary | ICD-10-CM | POA: Diagnosis not present

## 2018-08-06 DIAGNOSIS — K5900 Constipation, unspecified: Secondary | ICD-10-CM | POA: Diagnosis not present

## 2018-08-06 DIAGNOSIS — R4182 Altered mental status, unspecified: Secondary | ICD-10-CM | POA: Diagnosis not present

## 2018-08-06 DIAGNOSIS — N178 Other acute kidney failure: Secondary | ICD-10-CM | POA: Diagnosis not present

## 2018-08-06 DIAGNOSIS — I10 Essential (primary) hypertension: Secondary | ICD-10-CM | POA: Diagnosis not present

## 2018-08-06 DIAGNOSIS — R339 Retention of urine, unspecified: Secondary | ICD-10-CM | POA: Diagnosis not present

## 2018-08-06 DIAGNOSIS — G9340 Encephalopathy, unspecified: Secondary | ICD-10-CM | POA: Diagnosis not present

## 2018-08-06 NOTE — Telephone Encounter (Signed)
-----   Message from Cameron Sprang, MD sent at 08/02/2018 12:54 PM EDT ----- Pls let Dr. Orland Mustard know that the autoimmune bloodwork was normal. It looks like they were not able to do the B1 (thiamine) level, we need to check it, can you pls send an order to her facility so they can do it on her next bloodwork or if bloodwork can be done there? Thanks

## 2018-08-06 NOTE — Telephone Encounter (Signed)
Called Dr. Orland Mustard (pt's brother).  After 5 rings, received message that call could not be completed at this time. And was instructed to try again at a later time.

## 2018-08-07 DIAGNOSIS — R4182 Altered mental status, unspecified: Secondary | ICD-10-CM | POA: Diagnosis not present

## 2018-08-07 DIAGNOSIS — R296 Repeated falls: Secondary | ICD-10-CM | POA: Diagnosis not present

## 2018-08-07 DIAGNOSIS — G9340 Encephalopathy, unspecified: Secondary | ICD-10-CM | POA: Diagnosis not present

## 2018-08-07 DIAGNOSIS — K5900 Constipation, unspecified: Secondary | ICD-10-CM | POA: Diagnosis not present

## 2018-08-07 DIAGNOSIS — N178 Other acute kidney failure: Secondary | ICD-10-CM | POA: Diagnosis not present

## 2018-08-07 DIAGNOSIS — F419 Anxiety disorder, unspecified: Secondary | ICD-10-CM | POA: Diagnosis not present

## 2018-08-07 DIAGNOSIS — K5901 Slow transit constipation: Secondary | ICD-10-CM | POA: Diagnosis not present

## 2018-08-07 DIAGNOSIS — I10 Essential (primary) hypertension: Secondary | ICD-10-CM | POA: Diagnosis not present

## 2018-08-07 DIAGNOSIS — F331 Major depressive disorder, recurrent, moderate: Secondary | ICD-10-CM | POA: Diagnosis not present

## 2018-08-07 DIAGNOSIS — R339 Retention of urine, unspecified: Secondary | ICD-10-CM | POA: Diagnosis not present

## 2018-08-08 ENCOUNTER — Encounter: Payer: Medicare Other | Admitting: Neurology

## 2018-08-08 DIAGNOSIS — R339 Retention of urine, unspecified: Secondary | ICD-10-CM | POA: Diagnosis not present

## 2018-08-08 DIAGNOSIS — R4182 Altered mental status, unspecified: Secondary | ICD-10-CM | POA: Diagnosis not present

## 2018-08-08 DIAGNOSIS — I10 Essential (primary) hypertension: Secondary | ICD-10-CM | POA: Diagnosis not present

## 2018-08-08 DIAGNOSIS — K5900 Constipation, unspecified: Secondary | ICD-10-CM | POA: Diagnosis not present

## 2018-08-08 DIAGNOSIS — G9340 Encephalopathy, unspecified: Secondary | ICD-10-CM | POA: Diagnosis not present

## 2018-08-08 DIAGNOSIS — N178 Other acute kidney failure: Secondary | ICD-10-CM | POA: Diagnosis not present

## 2018-08-09 DIAGNOSIS — R339 Retention of urine, unspecified: Secondary | ICD-10-CM | POA: Diagnosis not present

## 2018-08-09 DIAGNOSIS — I10 Essential (primary) hypertension: Secondary | ICD-10-CM | POA: Diagnosis not present

## 2018-08-09 DIAGNOSIS — R4182 Altered mental status, unspecified: Secondary | ICD-10-CM | POA: Diagnosis not present

## 2018-08-09 DIAGNOSIS — K5900 Constipation, unspecified: Secondary | ICD-10-CM | POA: Diagnosis not present

## 2018-08-09 DIAGNOSIS — N178 Other acute kidney failure: Secondary | ICD-10-CM | POA: Diagnosis not present

## 2018-08-09 DIAGNOSIS — G9340 Encephalopathy, unspecified: Secondary | ICD-10-CM | POA: Diagnosis not present

## 2018-08-12 DIAGNOSIS — R4182 Altered mental status, unspecified: Secondary | ICD-10-CM | POA: Diagnosis not present

## 2018-08-12 DIAGNOSIS — K5900 Constipation, unspecified: Secondary | ICD-10-CM | POA: Diagnosis not present

## 2018-08-12 DIAGNOSIS — N178 Other acute kidney failure: Secondary | ICD-10-CM | POA: Diagnosis not present

## 2018-08-12 DIAGNOSIS — R339 Retention of urine, unspecified: Secondary | ICD-10-CM | POA: Diagnosis not present

## 2018-08-12 DIAGNOSIS — G9340 Encephalopathy, unspecified: Secondary | ICD-10-CM | POA: Diagnosis not present

## 2018-08-12 DIAGNOSIS — I10 Essential (primary) hypertension: Secondary | ICD-10-CM | POA: Diagnosis not present

## 2018-08-13 DIAGNOSIS — K5900 Constipation, unspecified: Secondary | ICD-10-CM | POA: Diagnosis not present

## 2018-08-13 DIAGNOSIS — R339 Retention of urine, unspecified: Secondary | ICD-10-CM | POA: Diagnosis not present

## 2018-08-13 DIAGNOSIS — N178 Other acute kidney failure: Secondary | ICD-10-CM | POA: Diagnosis not present

## 2018-08-13 DIAGNOSIS — I10 Essential (primary) hypertension: Secondary | ICD-10-CM | POA: Diagnosis not present

## 2018-08-13 DIAGNOSIS — G9340 Encephalopathy, unspecified: Secondary | ICD-10-CM | POA: Diagnosis not present

## 2018-08-13 DIAGNOSIS — R4182 Altered mental status, unspecified: Secondary | ICD-10-CM | POA: Diagnosis not present

## 2018-08-21 DIAGNOSIS — G9340 Encephalopathy, unspecified: Secondary | ICD-10-CM | POA: Diagnosis not present

## 2018-08-21 DIAGNOSIS — I10 Essential (primary) hypertension: Secondary | ICD-10-CM | POA: Diagnosis not present

## 2018-08-21 DIAGNOSIS — R296 Repeated falls: Secondary | ICD-10-CM | POA: Diagnosis not present

## 2018-08-30 ENCOUNTER — Other Ambulatory Visit: Payer: Self-pay | Admitting: *Deleted

## 2018-08-30 DIAGNOSIS — R253 Fasciculation: Secondary | ICD-10-CM

## 2018-08-30 MED ORDER — DIAZEPAM 5 MG PO TABS: ORAL_TABLET | ORAL | 0 refills | Status: DC

## 2018-09-02 DIAGNOSIS — M79672 Pain in left foot: Secondary | ICD-10-CM | POA: Diagnosis not present

## 2018-09-04 DIAGNOSIS — I10 Essential (primary) hypertension: Secondary | ICD-10-CM | POA: Diagnosis not present

## 2018-09-04 DIAGNOSIS — H5789 Other specified disorders of eye and adnexa: Secondary | ICD-10-CM | POA: Diagnosis not present

## 2018-09-04 DIAGNOSIS — M79672 Pain in left foot: Secondary | ICD-10-CM | POA: Diagnosis not present

## 2018-09-05 ENCOUNTER — Ambulatory Visit
Admission: RE | Admit: 2018-09-05 | Discharge: 2018-09-05 | Disposition: A | Discharge status: Home / Self Care | Payer: Medicare Other | Source: Ambulatory Visit | Attending: Neurology | Admitting: Neurology

## 2018-09-05 ENCOUNTER — Other Ambulatory Visit: Payer: Self-pay

## 2018-09-05 DIAGNOSIS — R413 Other amnesia: Secondary | ICD-10-CM

## 2018-09-05 MED ORDER — GADOBENATE DIMEGLUMINE 529 MG/ML IV SOLN
10.0000 mL | Freq: Once | INTRAVENOUS | Status: AC | PRN
  Administered 2018-09-05: 10 mL via INTRAVENOUS

## 2018-09-06 ENCOUNTER — Telehealth: Payer: Self-pay

## 2018-09-06 NOTE — Telephone Encounter (Signed)
Pt called and spoke withJOHN MORROW,MD   inform him  that her MRI brain shows mild age-related changes, no sign of tumor, stroke, or bleed. He verbalized understanding no questions,

## 2018-09-23 DIAGNOSIS — F419 Anxiety disorder, unspecified: Secondary | ICD-10-CM | POA: Diagnosis not present

## 2018-09-25 DIAGNOSIS — Z79899 Other long term (current) drug therapy: Secondary | ICD-10-CM | POA: Diagnosis not present

## 2018-09-25 DIAGNOSIS — I10 Essential (primary) hypertension: Secondary | ICD-10-CM | POA: Diagnosis not present

## 2018-09-25 DIAGNOSIS — F331 Major depressive disorder, recurrent, moderate: Secondary | ICD-10-CM | POA: Diagnosis not present

## 2018-09-25 DIAGNOSIS — R110 Nausea: Secondary | ICD-10-CM | POA: Diagnosis not present

## 2018-09-26 DIAGNOSIS — I10 Essential (primary) hypertension: Secondary | ICD-10-CM | POA: Diagnosis not present

## 2018-09-30 DIAGNOSIS — F419 Anxiety disorder, unspecified: Secondary | ICD-10-CM | POA: Diagnosis not present

## 2018-10-09 DIAGNOSIS — Z79899 Other long term (current) drug therapy: Secondary | ICD-10-CM | POA: Diagnosis not present

## 2018-10-09 DIAGNOSIS — I10 Essential (primary) hypertension: Secondary | ICD-10-CM | POA: Diagnosis not present

## 2018-10-09 DIAGNOSIS — K5901 Slow transit constipation: Secondary | ICD-10-CM | POA: Diagnosis not present

## 2018-10-09 DIAGNOSIS — R3989 Other symptoms and signs involving the genitourinary system: Secondary | ICD-10-CM | POA: Diagnosis not present

## 2018-10-09 DIAGNOSIS — F331 Major depressive disorder, recurrent, moderate: Secondary | ICD-10-CM | POA: Diagnosis not present

## 2018-10-11 DIAGNOSIS — Z79899 Other long term (current) drug therapy: Secondary | ICD-10-CM | POA: Diagnosis not present

## 2018-10-11 DIAGNOSIS — N390 Urinary tract infection, site not specified: Secondary | ICD-10-CM | POA: Diagnosis not present

## 2018-10-15 DIAGNOSIS — Z20828 Contact with and (suspected) exposure to other viral communicable diseases: Secondary | ICD-10-CM | POA: Diagnosis not present

## 2018-10-16 DIAGNOSIS — I10 Essential (primary) hypertension: Secondary | ICD-10-CM | POA: Diagnosis not present

## 2018-10-16 DIAGNOSIS — S51012D Laceration without foreign body of left elbow, subsequent encounter: Secondary | ICD-10-CM | POA: Diagnosis not present

## 2018-10-16 DIAGNOSIS — R451 Restlessness and agitation: Secondary | ICD-10-CM | POA: Diagnosis not present

## 2018-10-16 DIAGNOSIS — W19XXXD Unspecified fall, subsequent encounter: Secondary | ICD-10-CM | POA: Diagnosis not present

## 2018-10-16 DIAGNOSIS — Z79899 Other long term (current) drug therapy: Secondary | ICD-10-CM | POA: Diagnosis not present

## 2018-10-16 DIAGNOSIS — G9349 Other encephalopathy: Secondary | ICD-10-CM | POA: Diagnosis not present

## 2018-10-18 ENCOUNTER — Telehealth: Payer: Self-pay

## 2018-10-18 NOTE — Telephone Encounter (Signed)
Called the facility asking for lab results, they are faxing them over for Dr. Delice Lesch

## 2018-10-18 NOTE — Telephone Encounter (Signed)
[facility] faxed [place] the order back, I called them asking again for the results I was on hold for a while then [name] came back to the phone stated lab was not done due to the order from 08/30/2018 not being signed, order was reprinted and signed by Dr. Carles Collet on behalf of Dr Delice Lesch and refaxed [name] and asked for results to be faxed to our office , fax confirmation came back that they got the order

## 2018-10-20 DIAGNOSIS — F419 Anxiety disorder, unspecified: Secondary | ICD-10-CM | POA: Diagnosis not present

## 2018-10-23 ENCOUNTER — Other Ambulatory Visit: Payer: Self-pay | Admitting: Neurology

## 2018-10-23 DIAGNOSIS — J302 Other seasonal allergic rhinitis: Secondary | ICD-10-CM | POA: Diagnosis not present

## 2018-10-23 DIAGNOSIS — W19XXXD Unspecified fall, subsequent encounter: Secondary | ICD-10-CM | POA: Diagnosis not present

## 2018-10-23 DIAGNOSIS — I10 Essential (primary) hypertension: Secondary | ICD-10-CM | POA: Diagnosis not present

## 2018-10-23 DIAGNOSIS — R413 Other amnesia: Secondary | ICD-10-CM | POA: Diagnosis not present

## 2018-10-23 DIAGNOSIS — Z79899 Other long term (current) drug therapy: Secondary | ICD-10-CM | POA: Diagnosis not present

## 2018-10-23 MED ORDER — DONEPEZIL HCL 10 MG PO TABS: ORAL_TABLET | ORAL | 11 refills | Status: DC

## 2018-10-29 ENCOUNTER — Telehealth: Payer: Self-pay

## 2018-10-29 DIAGNOSIS — R2681 Unsteadiness on feet: Secondary | ICD-10-CM | POA: Diagnosis not present

## 2018-10-29 DIAGNOSIS — M6281 Muscle weakness (generalized): Secondary | ICD-10-CM | POA: Diagnosis not present

## 2018-10-29 DIAGNOSIS — R296 Repeated falls: Secondary | ICD-10-CM | POA: Diagnosis not present

## 2018-10-29 NOTE — Telephone Encounter (Signed)
Called to check on lab results, nurse stated that she did not see any results, stated she was going to call the lab and see if they have the boold/ results, waiting for return call

## 2018-10-29 NOTE — Telephone Encounter (Signed)
Called to get B1 results lab work had not been done, was told that it would be done today 10/23/2018 and that they would fax the results to Dr. Delice Lesch.

## 2018-10-30 ENCOUNTER — Telehealth: Payer: Self-pay | Admitting: Neurology

## 2018-10-30 DIAGNOSIS — M6281 Muscle weakness (generalized): Secondary | ICD-10-CM | POA: Diagnosis not present

## 2018-10-30 DIAGNOSIS — R2681 Unsteadiness on feet: Secondary | ICD-10-CM | POA: Diagnosis not present

## 2018-10-30 DIAGNOSIS — R296 Repeated falls: Secondary | ICD-10-CM | POA: Diagnosis not present

## 2018-10-30 NOTE — Telephone Encounter (Signed)
Agree, this is not acceptable. Let's check in again at the end of the week. Thanks

## 2018-10-30 NOTE — Telephone Encounter (Signed)
I dont understand this! Its been 2 Weeks Sarah Gutierrez is the DON I spoke to yesterday and she acted like it was no big deal this lab was ordered in April and its June and we have no results.

## 2018-10-30 NOTE — Telephone Encounter (Signed)
Left message with the after hour service on 10-29-18 @4 :41 pm    Caller states they are still waiting on the labs still

## 2018-11-01 ENCOUNTER — Telehealth: Payer: Self-pay | Admitting: Neurology

## 2018-11-01 NOTE — Telephone Encounter (Signed)
I called [facility] they still do not have the lab results

## 2018-11-01 NOTE — Telephone Encounter (Signed)
Thank you. I will send a message to her brother Dr. Orland Mustard as an update and let him know we are still waiting on this and how difficult a process it has been. Thanks

## 2018-11-01 NOTE — Telephone Encounter (Signed)
Returned call Burlene Arnt is at lunch I've already spoken with her today lab results are not back,

## 2018-11-01 NOTE — Telephone Encounter (Signed)
Stanton Kidney is returning your call according to her VM

## 2018-11-02 DIAGNOSIS — M6281 Muscle weakness (generalized): Secondary | ICD-10-CM | POA: Diagnosis not present

## 2018-11-02 DIAGNOSIS — R296 Repeated falls: Secondary | ICD-10-CM | POA: Diagnosis not present

## 2018-11-02 DIAGNOSIS — R2681 Unsteadiness on feet: Secondary | ICD-10-CM | POA: Diagnosis not present

## 2018-11-05 ENCOUNTER — Telehealth: Payer: Self-pay

## 2018-11-05 NOTE — Telephone Encounter (Signed)
Called today to check on lab results still no results. I asked was the blood work really done? Nurse Dianna stated that yes looks like it was done on the 10th. Will continue to follow up.

## 2018-11-08 ENCOUNTER — Encounter: Payer: Self-pay | Admitting: Neurology

## 2018-11-08 DIAGNOSIS — M6281 Muscle weakness (generalized): Secondary | ICD-10-CM | POA: Diagnosis not present

## 2018-11-08 DIAGNOSIS — R2681 Unsteadiness on feet: Secondary | ICD-10-CM | POA: Diagnosis not present

## 2018-11-08 DIAGNOSIS — R296 Repeated falls: Secondary | ICD-10-CM | POA: Diagnosis not present

## 2018-11-09 DIAGNOSIS — R2681 Unsteadiness on feet: Secondary | ICD-10-CM | POA: Diagnosis not present

## 2018-11-09 DIAGNOSIS — M6281 Muscle weakness (generalized): Secondary | ICD-10-CM | POA: Diagnosis not present

## 2018-11-09 DIAGNOSIS — R296 Repeated falls: Secondary | ICD-10-CM | POA: Diagnosis not present

## 2018-11-11 DIAGNOSIS — M6281 Muscle weakness (generalized): Secondary | ICD-10-CM | POA: Diagnosis not present

## 2018-11-11 DIAGNOSIS — R2681 Unsteadiness on feet: Secondary | ICD-10-CM | POA: Diagnosis not present

## 2018-11-11 DIAGNOSIS — R296 Repeated falls: Secondary | ICD-10-CM | POA: Diagnosis not present

## 2018-11-13 ENCOUNTER — Other Ambulatory Visit: Payer: Self-pay

## 2018-11-13 ENCOUNTER — Telehealth: Payer: Self-pay

## 2018-11-13 DIAGNOSIS — F04 Amnestic disorder due to known physiological condition: Secondary | ICD-10-CM

## 2018-11-13 DIAGNOSIS — F0391 Unspecified dementia with behavioral disturbance: Secondary | ICD-10-CM

## 2018-11-13 DIAGNOSIS — F03B18 Unspecified dementia, moderate, with other behavioral disturbance: Secondary | ICD-10-CM

## 2018-11-13 NOTE — Telephone Encounter (Signed)
[facility] called informed that STAT B1 level order was faxed to them. To fax results to Dr. Delice Lesch. Follow up on Lab ordered in April that was drawn May 10th still no results,

## 2018-11-18 ENCOUNTER — Encounter (HOSPITAL_COMMUNITY): Payer: Self-pay

## 2018-11-18 ENCOUNTER — Emergency Department (HOSPITAL_COMMUNITY)
Admission: EM | Admit: 2018-11-18 | Discharge: 2018-11-18 | Disposition: A | Discharge status: Home / Self Care | Payer: Medicare Other | Attending: Emergency Medicine | Admitting: Emergency Medicine

## 2018-11-18 ENCOUNTER — Emergency Department (HOSPITAL_COMMUNITY): Payer: Medicare Other

## 2018-11-18 DIAGNOSIS — Z79899 Other long term (current) drug therapy: Secondary | ICD-10-CM | POA: Insufficient documentation

## 2018-11-18 DIAGNOSIS — Z7401 Bed confinement status: Secondary | ICD-10-CM | POA: Diagnosis not present

## 2018-11-18 DIAGNOSIS — M2550 Pain in unspecified joint: Secondary | ICD-10-CM | POA: Diagnosis not present

## 2018-11-18 DIAGNOSIS — I959 Hypotension, unspecified: Secondary | ICD-10-CM | POA: Diagnosis not present

## 2018-11-18 DIAGNOSIS — R410 Disorientation, unspecified: Secondary | ICD-10-CM | POA: Diagnosis not present

## 2018-11-18 DIAGNOSIS — S2242XA Multiple fractures of ribs, left side, initial encounter for closed fracture: Secondary | ICD-10-CM | POA: Diagnosis not present

## 2018-11-18 DIAGNOSIS — R55 Syncope and collapse: Secondary | ICD-10-CM | POA: Diagnosis not present

## 2018-11-18 DIAGNOSIS — I10 Essential (primary) hypertension: Secondary | ICD-10-CM | POA: Insufficient documentation

## 2018-11-18 DIAGNOSIS — R4020 Unspecified coma: Secondary | ICD-10-CM | POA: Diagnosis not present

## 2018-11-18 LAB — CBC WITH DIFFERENTIAL/PLATELET
Abs Immature Granulocytes: 0.02 10*3/uL (ref 0.00–0.07)
Basophils Absolute: 0.1 10*3/uL (ref 0.0–0.1)
Basophils Relative: 1 %
Eosinophils Absolute: 0.1 10*3/uL (ref 0.0–0.5)
Eosinophils Relative: 1 %
HCT: 43.4 % (ref 36.0–46.0)
Hemoglobin: 13.4 g/dL (ref 12.0–15.0)
Immature Granulocytes: 0 %
Lymphocytes Relative: 24 %
Lymphs Abs: 1.8 10*3/uL (ref 0.7–4.0)
MCH: 30 pg (ref 26.0–34.0)
MCHC: 30.9 g/dL (ref 30.0–36.0)
MCV: 97.1 fL (ref 80.0–100.0)
Monocytes Absolute: 0.4 10*3/uL (ref 0.1–1.0)
Monocytes Relative: 5 %
Neutro Abs: 5.1 10*3/uL (ref 1.7–7.7)
Neutrophils Relative %: 69 %
Platelets: 292 10*3/uL (ref 150–400)
RBC: 4.47 MIL/uL (ref 3.87–5.11)
RDW: 13.3 % (ref 11.5–15.5)
WBC: 7.4 10*3/uL (ref 4.0–10.5)
nRBC: 0 % (ref 0.0–0.2)

## 2018-11-18 LAB — COMPREHENSIVE METABOLIC PANEL
ALT: 19 U/L (ref 0–44)
AST: 21 U/L (ref 15–41)
Albumin: 3.8 g/dL (ref 3.5–5.0)
Alkaline Phosphatase: 105 U/L (ref 38–126)
Anion gap: 15 (ref 5–15)
BUN: 16 mg/dL (ref 8–23)
CO2: 20 mmol/L — ABNORMAL LOW (ref 22–32)
Calcium: 10.4 mg/dL — ABNORMAL HIGH (ref 8.9–10.3)
Chloride: 106 mmol/L (ref 98–111)
Creatinine, Ser: 0.81 mg/dL (ref 0.44–1.00)
GFR calc Af Amer: >60 mL/min (ref >60)
GFR calc non Af Amer: >60 mL/min (ref >60)
Glucose, Bld: 106 mg/dL — ABNORMAL HIGH (ref 70–99)
Potassium: 4 mmol/L (ref 3.5–5.1)
Sodium: 141 mmol/L (ref 135–145)
Total Bilirubin: 0.8 mg/dL (ref 0.3–1.2)
Total Protein: 6.7 g/dL (ref 6.5–8.1)

## 2018-11-18 LAB — CBG MONITORING, ED: Glucose-Capillary: 100 mg/dL — ABNORMAL HIGH (ref 70–99)

## 2018-11-18 MED ORDER — SODIUM CHLORIDE 0.9 % IV SOLN: INTRAVENOUS | Status: DC

## 2018-11-18 NOTE — ED Notes (Signed)
Reports being legal guardian:  Dr Harvel Quale  (brother)  In [place]  Phone [phone]

## 2018-11-18 NOTE — ED Notes (Signed)
Help get patient on the monitor did ekg shown to Dr Vanita Panda patient is resting with call bell in reach

## 2018-11-18 NOTE — ED Triage Notes (Signed)
Patient had syncopal episode at 815ish this morning for 10 minutes.  Patient back to baseline by the time EMS arrived at facility

## 2018-11-18 NOTE — ED Provider Notes (Signed)
[facility] Provider Note   CSN: [id number] Arrival date & time: 11/18/18  0909    History   Chief Complaint Chief Complaint  Patient presents with  . Loss of Consciousness    HPI PARA COSSEY is a 73 y.o. female.     HPI Patient presents from nursing facility after an episode of syncope. This was witnessed The patient has no recollection of the event, currently denies pain, discomfort, nausea, lightheadedness, dyspnea. She is oriented to self, roughly to place. Per report, via EMS providers, the patient was at her facility in her usual state of health with no recent medication changes, diet changes, when she had an episode of approximately 20 minutes of diminished interactivity, with initial eyes rolling back. Patient has noted history of encephalopathy, memory loss, no formal diagnosis of dementia, per report. Level 5 caveat secondary to the patient's cognitive impairment. Past Medical History:  Diagnosis Date  . Hypertension     Patient Active Problem List   Diagnosis Date Noted  . Protein-calorie malnutrition, severe 05/29/2018  . Pressure injury of skin 05/29/2018  . AKI (acute kidney injury) ([place])   . Acute metabolic encephalopathy 63/87/5643  . Dehydration 05/20/2018  . Hypercalcemia 05/20/2018  . Hypernatremia 05/20/2018  . Lactic acid acidosis 05/20/2018  . Elevated troponin 05/20/2018  . Hypertension     History reviewed. No pertinent surgical history.   OB History   No obstetric history on file.      Home Medications    Prior to Admission medications   Medication Sig Start Date End Date Taking? Authorizing Provider  amLODipine (NORVASC) 5 MG tablet Take 1 tablet (5 mg total) by mouth daily. 05/30/18   Dessa Phi, DO  bisacodyl (DULCOLAX) 10 MG suppository Place 1 suppository (10 mg total) rectally as needed for moderate constipation. 07/12/18   Hayden Rasmussen, MD  Cholecalciferol 1.25 MG (50000 UT)  capsule Take 50,000 Units by mouth every Tuesday.    [provider]  diazepam (VALIUM) 5 MG tablet Take 1 tablet 30 minutes prior to MRI, may take second dose if needed 08/30/18   Cameron Sprang, MD  docusate sodium (COLACE) 100 MG capsule Take 100 mg by mouth at bedtime.    [provider]  donepezil (ARICEPT) 10 MG tablet Take 1/2 tablet daily for 2 weeks, then increase to 1 tablet daily and continue 10/23/18   Cameron Sprang, MD  folic acid (FOLVITE) 1 MG tablet Take 1 tablet (1 mg total) by mouth daily. 05/30/18   Dessa Phi, DO  LORazepam (ATIVAN) 0.5 MG tablet Take 0.5 mg by mouth 3 (three) times daily.    [provider]  Multiple Vitamins-Minerals (MULTIVITAMIN PO) Take 1 tablet by mouth daily.    [provider]  polyethylene glycol (MIRALAX / GLYCOLAX) packet Take 17 g by mouth daily. 06/01/18   Dessa Phi, DO  senna-docusate (SENOKOT-S) 8.6-50 MG tablet Take 1 tablet by mouth daily. 06/01/18   Dessa Phi, DO  sertraline (ZOLOFT) 25 MG tablet Take 1 tablet (25 mg total) by mouth daily. 07/31/18   Cameron Sprang, MD  thiamine 100 MG tablet Take 1 tablet (100 mg total) by mouth daily. 05/30/18   Dessa Phi, DO  valACYclovir (VALTREX) 1000 MG tablet Take 1,000 mg by mouth every 12 (twelve) hours. For 10 days 07/04/18   [provider]    Family History Family History  Problem Relation Age of Onset  . CAD Brother 58  has coronary stent     Social History Social History   Tobacco Use  . Smoking status: Never Smoker  . Smokeless tobacco: Never Used  Substance Use Topics  . 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 Drug use: Not on file     Allergies   Patient has no known allergies.   Review of Systems Review of Systems  Unable to perform ROS: Mental status change     Physical Exam Updated Vital Signs BP (!) 156/72   Pulse (!) 58   Temp 98 F (36.7 C) (Oral)   Resp 14   SpO2 100%    Physical Exam Vitals signs and nursing note reviewed.  Constitutional:      General: She is not in acute distress.    Appearance: She is well-developed. She is ill-appearing.     Comments: Frail elderly female sitting upright, speaking briefly, appropriately.  HENT:     Head: Normocephalic and atraumatic.  Eyes:     Conjunctiva/sclera: Conjunctivae normal.  Cardiovascular:     Rate and Rhythm: Normal rate and regular rhythm.  Pulmonary:     Effort: Pulmonary effort is normal. No respiratory distress.     Breath sounds: Normal breath sounds. No stridor.  Abdominal:     General: There is no distension.  Skin:    General: Skin is warm and dry.  Neurological:     Mental Status: She is alert.     Cranial Nerves: No cranial nerve deficit.     Motor: Atrophy present. No tremor.     Comments: Oriented to self, roughly to place  Psychiatric:        Cognition and Memory: Cognition is impaired.      ED Treatments / Results  Labs (all labs ordered are listed, but only abnormal results are displayed) Labs Reviewed  COMPREHENSIVE METABOLIC PANEL - Abnormal; Notable for the following components:      Result Value   CO2 20 (*)    Glucose, Bld 106 (*)    Calcium 10.4 (*)    All other components within normal limits  CBG MONITORING, ED - Abnormal; Notable for the following components:   Glucose-Capillary 100 (*)    All other components within normal limits  CBC WITH DIFFERENTIAL/PLATELET  VITAMIN B1    EMS rhythm strip sinus rhythm, rate 56, unremarkable  EKG EKG Interpretation  Date/Time:  Monday November 18 2018 09:12:00 EDT Ventricular Rate:  57 PR Interval:    QRS Duration: 69 QT Interval:  430 QTC Calculation: 419 R Axis:   77 Text Interpretation:  Sinus rhythm Artifact Baseline wander Abnormal ECG Confirmed by Carmin Muskrat [phone]) on 11/18/2018 9:14:53 AM   Radiology Dg Chest Port 1 View  Result Date: 11/18/2018 CLINICAL DATA:  Pt states had a syncope episode  No  other symptoms EXAM: PORTABLE CHEST 1 VIEW COMPARISON:  07/12/2018 FINDINGS: Heart size is accentuated by the portable AP technique. The lungs are free of focal consolidations and pleural effusions. No pulmonary edema. Deformity of LEFT ribs 7 and 8 and RIGHT rib 7. No pneumothorax. IMPRESSION: 1. No evidence for acute cardiopulmonary abnormality. 2. Bilateral rib fractures are acute or subacute. Electronically Signed   By: Nolon Nations M.D.   On: 11/18/2018 09:35    Procedures Procedures (including critical care time)  Medications Ordered in ED Medications  0.9 %  sodium chloride infusion (has no administration in time range)     Initial Impression /  Assessment and Plan / ED Course  I have reviewed the triage vital signs and the nursing notes.  Pertinent labs & imaging results that were available during my care of the patient were reviewed by me and considered in my medical decision making (see chart for details).        11:45 AM Patient in no distress, awake, alert. She continues to exhibit similar pattern of disorientation, being aware of herself, only roughly 2 place, not to time. I discussed the patient's history with her brother, a physician. 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 Here evaluation has been reassuring, with unremarkable chemistry panel, CBC, x-ray, no evidence for ACS, pneumonia, with no distress, discussed discharge back to her facility, to which she and he are both amenable. Some suspicion for vagal episode given the new knowledge of the episode occurred while the patient was on the commode. Patient will follow up with primary care, and to facilitate additional work-up, thiamine level has been sent.  Final Clinical Impressions(s) / ED Diagnoses   Final diagnoses:  Syncope and collapse      Carmin Muskrat,  MD 11/18/18 1147

## 2018-11-18 NOTE — Discharge Instructions (Addendum)
As discussed, your evaluation today has been largely reassuring.  But, it is important that you monitor your condition carefully, and do not hesitate to return to the ED if you develop new, or concerning changes in your condition.  Otherwise, please follow-up with your physician for appropriate ongoing care.  To help facilitate further evaluation of your condition, a thiamine level test has been sent. These results should be available in the next 48 hours.

## 2018-11-18 NOTE — ED Notes (Signed)
[facility] called for pt transport. 

## 2018-11-18 NOTE — ED Notes (Signed)
Patient verbalizes understanding of discharge instructions. Opportunity for questioning and answers were provided. Armband removed by staff, pt discharged from ED to [facility] via [facility].

## 2018-11-19 DIAGNOSIS — M6281 Muscle weakness (generalized): Secondary | ICD-10-CM | POA: Diagnosis not present

## 2018-11-19 DIAGNOSIS — R2681 Unsteadiness on feet: Secondary | ICD-10-CM | POA: Diagnosis not present

## 2018-11-19 DIAGNOSIS — R296 Repeated falls: Secondary | ICD-10-CM | POA: Diagnosis not present

## 2018-11-20 DIAGNOSIS — I10 Essential (primary) hypertension: Secondary | ICD-10-CM | POA: Diagnosis not present

## 2018-11-20 DIAGNOSIS — R42 Dizziness and giddiness: Secondary | ICD-10-CM | POA: Diagnosis not present

## 2018-11-20 DIAGNOSIS — R413 Other amnesia: Secondary | ICD-10-CM | POA: Diagnosis not present

## 2018-11-20 DIAGNOSIS — Z09 Encounter for follow-up examination after completed treatment for conditions other than malignant neoplasm: Secondary | ICD-10-CM | POA: Diagnosis not present

## 2018-11-20 DIAGNOSIS — R110 Nausea: Secondary | ICD-10-CM | POA: Diagnosis not present

## 2018-11-20 DIAGNOSIS — Z79899 Other long term (current) drug therapy: Secondary | ICD-10-CM | POA: Diagnosis not present

## 2018-11-20 DIAGNOSIS — R4182 Altered mental status, unspecified: Secondary | ICD-10-CM | POA: Diagnosis not present

## 2018-11-20 DIAGNOSIS — R55 Syncope and collapse: Secondary | ICD-10-CM | POA: Diagnosis not present

## 2018-11-20 DIAGNOSIS — R000 Tachycardia, unspecified: Secondary | ICD-10-CM | POA: Diagnosis not present

## 2018-11-21 DIAGNOSIS — F04 Amnestic disorder due to known physiological condition: Secondary | ICD-10-CM | POA: Diagnosis not present

## 2018-11-25 DIAGNOSIS — R2681 Unsteadiness on feet: Secondary | ICD-10-CM | POA: Diagnosis not present

## 2018-11-25 DIAGNOSIS — M6281 Muscle weakness (generalized): Secondary | ICD-10-CM | POA: Diagnosis not present

## 2018-11-25 DIAGNOSIS — R296 Repeated falls: Secondary | ICD-10-CM | POA: Diagnosis not present

## 2018-11-27 DIAGNOSIS — M6281 Muscle weakness (generalized): Secondary | ICD-10-CM | POA: Diagnosis not present

## 2018-11-27 DIAGNOSIS — R296 Repeated falls: Secondary | ICD-10-CM | POA: Diagnosis not present

## 2018-11-27 DIAGNOSIS — R2681 Unsteadiness on feet: Secondary | ICD-10-CM | POA: Diagnosis not present

## 2018-11-28 DIAGNOSIS — M6281 Muscle weakness (generalized): Secondary | ICD-10-CM | POA: Diagnosis not present

## 2018-11-28 DIAGNOSIS — R2681 Unsteadiness on feet: Secondary | ICD-10-CM | POA: Diagnosis not present

## 2018-11-28 DIAGNOSIS — R296 Repeated falls: Secondary | ICD-10-CM | POA: Diagnosis not present

## 2018-12-05 DIAGNOSIS — R2681 Unsteadiness on feet: Secondary | ICD-10-CM | POA: Diagnosis not present

## 2018-12-05 DIAGNOSIS — R296 Repeated falls: Secondary | ICD-10-CM | POA: Diagnosis not present

## 2018-12-05 DIAGNOSIS — M6281 Muscle weakness (generalized): Secondary | ICD-10-CM | POA: Diagnosis not present

## 2018-12-06 DIAGNOSIS — R2681 Unsteadiness on feet: Secondary | ICD-10-CM | POA: Diagnosis not present

## 2018-12-06 DIAGNOSIS — M6281 Muscle weakness (generalized): Secondary | ICD-10-CM | POA: Diagnosis not present

## 2018-12-06 DIAGNOSIS — R296 Repeated falls: Secondary | ICD-10-CM | POA: Diagnosis not present

## 2018-12-09 DIAGNOSIS — M6281 Muscle weakness (generalized): Secondary | ICD-10-CM | POA: Diagnosis not present

## 2018-12-09 DIAGNOSIS — R2681 Unsteadiness on feet: Secondary | ICD-10-CM | POA: Diagnosis not present

## 2018-12-09 DIAGNOSIS — R296 Repeated falls: Secondary | ICD-10-CM | POA: Diagnosis not present

## 2018-12-10 DIAGNOSIS — M6281 Muscle weakness (generalized): Secondary | ICD-10-CM | POA: Diagnosis not present

## 2018-12-10 DIAGNOSIS — R2681 Unsteadiness on feet: Secondary | ICD-10-CM | POA: Diagnosis not present

## 2018-12-10 DIAGNOSIS — R296 Repeated falls: Secondary | ICD-10-CM | POA: Diagnosis not present

## 2018-12-11 DIAGNOSIS — I10 Essential (primary) hypertension: Secondary | ICD-10-CM | POA: Diagnosis not present

## 2018-12-11 DIAGNOSIS — K5901 Slow transit constipation: Secondary | ICD-10-CM | POA: Diagnosis not present

## 2018-12-11 DIAGNOSIS — R262 Difficulty in walking, not elsewhere classified: Secondary | ICD-10-CM | POA: Diagnosis not present

## 2018-12-11 DIAGNOSIS — Z79899 Other long term (current) drug therapy: Secondary | ICD-10-CM | POA: Diagnosis not present

## 2018-12-13 DIAGNOSIS — R2681 Unsteadiness on feet: Secondary | ICD-10-CM | POA: Diagnosis not present

## 2018-12-13 DIAGNOSIS — M6281 Muscle weakness (generalized): Secondary | ICD-10-CM | POA: Diagnosis not present

## 2018-12-13 DIAGNOSIS — R296 Repeated falls: Secondary | ICD-10-CM | POA: Diagnosis not present

## 2018-12-16 DIAGNOSIS — R2681 Unsteadiness on feet: Secondary | ICD-10-CM | POA: Diagnosis not present

## 2018-12-16 DIAGNOSIS — R296 Repeated falls: Secondary | ICD-10-CM | POA: Diagnosis not present

## 2018-12-16 DIAGNOSIS — M6281 Muscle weakness (generalized): Secondary | ICD-10-CM | POA: Diagnosis not present

## 2018-12-17 DIAGNOSIS — R2681 Unsteadiness on feet: Secondary | ICD-10-CM | POA: Diagnosis not present

## 2018-12-17 DIAGNOSIS — M6281 Muscle weakness (generalized): Secondary | ICD-10-CM | POA: Diagnosis not present

## 2018-12-17 DIAGNOSIS — R296 Repeated falls: Secondary | ICD-10-CM | POA: Diagnosis not present

## 2018-12-18 DIAGNOSIS — R21 Rash and other nonspecific skin eruption: Secondary | ICD-10-CM | POA: Diagnosis not present

## 2018-12-18 DIAGNOSIS — Z79899 Other long term (current) drug therapy: Secondary | ICD-10-CM | POA: Diagnosis not present

## 2018-12-18 DIAGNOSIS — I10 Essential (primary) hypertension: Secondary | ICD-10-CM | POA: Diagnosis not present

## 2018-12-18 DIAGNOSIS — F331 Major depressive disorder, recurrent, moderate: Secondary | ICD-10-CM | POA: Diagnosis not present

## 2018-12-20 DIAGNOSIS — R2681 Unsteadiness on feet: Secondary | ICD-10-CM | POA: Diagnosis not present

## 2018-12-20 DIAGNOSIS — M6281 Muscle weakness (generalized): Secondary | ICD-10-CM | POA: Diagnosis not present

## 2018-12-20 DIAGNOSIS — R296 Repeated falls: Secondary | ICD-10-CM | POA: Diagnosis not present

## 2018-12-23 DIAGNOSIS — R296 Repeated falls: Secondary | ICD-10-CM | POA: Diagnosis not present

## 2018-12-23 DIAGNOSIS — R2681 Unsteadiness on feet: Secondary | ICD-10-CM | POA: Diagnosis not present

## 2018-12-23 DIAGNOSIS — M6281 Muscle weakness (generalized): Secondary | ICD-10-CM | POA: Diagnosis not present

## 2018-12-26 DIAGNOSIS — R296 Repeated falls: Secondary | ICD-10-CM | POA: Diagnosis not present

## 2018-12-26 DIAGNOSIS — R2681 Unsteadiness on feet: Secondary | ICD-10-CM | POA: Diagnosis not present

## 2018-12-26 DIAGNOSIS — M6281 Muscle weakness (generalized): Secondary | ICD-10-CM | POA: Diagnosis not present

## 2018-12-27 DIAGNOSIS — M6281 Muscle weakness (generalized): Secondary | ICD-10-CM | POA: Diagnosis not present

## 2018-12-27 DIAGNOSIS — Z79899 Other long term (current) drug therapy: Secondary | ICD-10-CM | POA: Diagnosis not present

## 2018-12-27 DIAGNOSIS — R296 Repeated falls: Secondary | ICD-10-CM | POA: Diagnosis not present

## 2018-12-27 DIAGNOSIS — N390 Urinary tract infection, site not specified: Secondary | ICD-10-CM | POA: Diagnosis not present

## 2018-12-27 DIAGNOSIS — R2681 Unsteadiness on feet: Secondary | ICD-10-CM | POA: Diagnosis not present

## 2018-12-30 DIAGNOSIS — R2681 Unsteadiness on feet: Secondary | ICD-10-CM | POA: Diagnosis not present

## 2018-12-30 DIAGNOSIS — R296 Repeated falls: Secondary | ICD-10-CM | POA: Diagnosis not present

## 2018-12-30 DIAGNOSIS — M6281 Muscle weakness (generalized): Secondary | ICD-10-CM | POA: Diagnosis not present

## 2019-01-01 DIAGNOSIS — R2681 Unsteadiness on feet: Secondary | ICD-10-CM | POA: Diagnosis not present

## 2019-01-01 DIAGNOSIS — M6281 Muscle weakness (generalized): Secondary | ICD-10-CM | POA: Diagnosis not present

## 2019-01-01 DIAGNOSIS — R296 Repeated falls: Secondary | ICD-10-CM | POA: Diagnosis not present

## 2019-01-02 DIAGNOSIS — R2681 Unsteadiness on feet: Secondary | ICD-10-CM | POA: Diagnosis not present

## 2019-01-02 DIAGNOSIS — M6281 Muscle weakness (generalized): Secondary | ICD-10-CM | POA: Diagnosis not present

## 2019-01-02 DIAGNOSIS — R296 Repeated falls: Secondary | ICD-10-CM | POA: Diagnosis not present

## 2019-01-03 ENCOUNTER — Other Ambulatory Visit: Payer: Self-pay

## 2019-01-03 DIAGNOSIS — H53009 Unspecified amblyopia, unspecified eye: Secondary | ICD-10-CM

## 2019-01-03 DIAGNOSIS — H5500 Unspecified nystagmus: Secondary | ICD-10-CM

## 2019-01-06 DIAGNOSIS — R2681 Unsteadiness on feet: Secondary | ICD-10-CM | POA: Diagnosis not present

## 2019-01-06 DIAGNOSIS — M6281 Muscle weakness (generalized): Secondary | ICD-10-CM | POA: Diagnosis not present

## 2019-01-06 DIAGNOSIS — R296 Repeated falls: Secondary | ICD-10-CM | POA: Diagnosis not present

## 2019-01-10 DIAGNOSIS — R2681 Unsteadiness on feet: Secondary | ICD-10-CM | POA: Diagnosis not present

## 2019-01-10 DIAGNOSIS — R296 Repeated falls: Secondary | ICD-10-CM | POA: Diagnosis not present

## 2019-01-10 DIAGNOSIS — M6281 Muscle weakness (generalized): Secondary | ICD-10-CM | POA: Diagnosis not present

## 2019-01-11 DIAGNOSIS — R2681 Unsteadiness on feet: Secondary | ICD-10-CM | POA: Diagnosis not present

## 2019-01-11 DIAGNOSIS — M6281 Muscle weakness (generalized): Secondary | ICD-10-CM | POA: Diagnosis not present

## 2019-01-11 DIAGNOSIS — R296 Repeated falls: Secondary | ICD-10-CM | POA: Diagnosis not present

## 2019-01-14 DIAGNOSIS — H53022 Refractive amblyopia, left eye: Secondary | ICD-10-CM | POA: Diagnosis not present

## 2019-01-14 DIAGNOSIS — H25013 Cortical age-related cataract, bilateral: Secondary | ICD-10-CM | POA: Diagnosis not present

## 2019-01-14 DIAGNOSIS — H2513 Age-related nuclear cataract, bilateral: Secondary | ICD-10-CM | POA: Diagnosis not present

## 2019-01-14 DIAGNOSIS — H5500 Unspecified nystagmus: Secondary | ICD-10-CM | POA: Diagnosis not present

## 2019-01-14 DIAGNOSIS — H35033 Hypertensive retinopathy, bilateral: Secondary | ICD-10-CM | POA: Diagnosis not present

## 2019-01-15 DIAGNOSIS — M6281 Muscle weakness (generalized): Secondary | ICD-10-CM | POA: Diagnosis not present

## 2019-01-15 DIAGNOSIS — R296 Repeated falls: Secondary | ICD-10-CM | POA: Diagnosis not present

## 2019-01-15 DIAGNOSIS — R2681 Unsteadiness on feet: Secondary | ICD-10-CM | POA: Diagnosis not present

## 2019-01-17 DIAGNOSIS — R2681 Unsteadiness on feet: Secondary | ICD-10-CM | POA: Diagnosis not present

## 2019-01-17 DIAGNOSIS — R296 Repeated falls: Secondary | ICD-10-CM | POA: Diagnosis not present

## 2019-01-17 DIAGNOSIS — M6281 Muscle weakness (generalized): Secondary | ICD-10-CM | POA: Diagnosis not present

## 2019-01-18 DIAGNOSIS — R296 Repeated falls: Secondary | ICD-10-CM | POA: Diagnosis not present

## 2019-01-18 DIAGNOSIS — R2681 Unsteadiness on feet: Secondary | ICD-10-CM | POA: Diagnosis not present

## 2019-01-18 DIAGNOSIS — M6281 Muscle weakness (generalized): Secondary | ICD-10-CM | POA: Diagnosis not present

## 2019-01-24 DIAGNOSIS — R296 Repeated falls: Secondary | ICD-10-CM | POA: Diagnosis not present

## 2019-01-24 DIAGNOSIS — R2681 Unsteadiness on feet: Secondary | ICD-10-CM | POA: Diagnosis not present

## 2019-01-24 DIAGNOSIS — M6281 Muscle weakness (generalized): Secondary | ICD-10-CM | POA: Diagnosis not present

## 2019-01-25 DIAGNOSIS — R296 Repeated falls: Secondary | ICD-10-CM | POA: Diagnosis not present

## 2019-01-25 DIAGNOSIS — M6281 Muscle weakness (generalized): Secondary | ICD-10-CM | POA: Diagnosis not present

## 2019-01-25 DIAGNOSIS — R2681 Unsteadiness on feet: Secondary | ICD-10-CM | POA: Diagnosis not present

## 2019-01-27 DIAGNOSIS — R2681 Unsteadiness on feet: Secondary | ICD-10-CM | POA: Diagnosis not present

## 2019-01-27 DIAGNOSIS — M6281 Muscle weakness (generalized): Secondary | ICD-10-CM | POA: Diagnosis not present

## 2019-01-27 DIAGNOSIS — R296 Repeated falls: Secondary | ICD-10-CM | POA: Diagnosis not present

## 2019-01-28 ENCOUNTER — Other Ambulatory Visit: Payer: Self-pay

## 2019-01-28 DIAGNOSIS — Z20828 Contact with and (suspected) exposure to other viral communicable diseases: Secondary | ICD-10-CM | POA: Diagnosis not present

## 2019-01-28 MED ORDER — QUETIAPINE FUMARATE 25 MG PO TABS: 25.0000 mg | ORAL_TABLET | Freq: Every day | ORAL | 0 refills | Status: DC | PRN

## 2019-01-28 NOTE — Telephone Encounter (Signed)
Seroquel sent to [facility]. Per [employer] to [phone]  Sarah Gutierrez is the pharmacy [facility] participates with. Their fax is [phone]  Pts. Brother informed of all. He states that a NP is coming to evaluate his sister soon. They will discuss daily routines and avoidance of triggers.

## 2019-01-29 ENCOUNTER — Telehealth: Payer: Self-pay | Admitting: Neurology

## 2019-01-29 DIAGNOSIS — M6281 Muscle weakness (generalized): Secondary | ICD-10-CM | POA: Diagnosis not present

## 2019-01-29 DIAGNOSIS — R296 Repeated falls: Secondary | ICD-10-CM | POA: Diagnosis not present

## 2019-01-29 DIAGNOSIS — R2681 Unsteadiness on feet: Secondary | ICD-10-CM | POA: Diagnosis not present

## 2019-01-29 NOTE — Telephone Encounter (Signed)
[facility] Pons called regarding patient and her being put on Anxiety medication. The patient's sibling advised them. They are needing an order for this medication. Thanks

## 2019-01-29 NOTE — Telephone Encounter (Signed)
Order faxed to [phone]

## 2019-01-30 DIAGNOSIS — R2681 Unsteadiness on feet: Secondary | ICD-10-CM | POA: Diagnosis not present

## 2019-01-30 DIAGNOSIS — M6281 Muscle weakness (generalized): Secondary | ICD-10-CM | POA: Diagnosis not present

## 2019-01-30 DIAGNOSIS — R296 Repeated falls: Secondary | ICD-10-CM | POA: Diagnosis not present

## 2019-02-01 DIAGNOSIS — F331 Major depressive disorder, recurrent, moderate: Secondary | ICD-10-CM | POA: Diagnosis not present

## 2019-02-01 DIAGNOSIS — F411 Generalized anxiety disorder: Secondary | ICD-10-CM | POA: Diagnosis not present

## 2019-02-03 DIAGNOSIS — M6281 Muscle weakness (generalized): Secondary | ICD-10-CM | POA: Diagnosis not present

## 2019-02-03 DIAGNOSIS — R296 Repeated falls: Secondary | ICD-10-CM | POA: Diagnosis not present

## 2019-02-03 DIAGNOSIS — R2681 Unsteadiness on feet: Secondary | ICD-10-CM | POA: Diagnosis not present

## 2019-02-05 DIAGNOSIS — R296 Repeated falls: Secondary | ICD-10-CM | POA: Diagnosis not present

## 2019-02-05 DIAGNOSIS — R2681 Unsteadiness on feet: Secondary | ICD-10-CM | POA: Diagnosis not present

## 2019-02-05 DIAGNOSIS — M6281 Muscle weakness (generalized): Secondary | ICD-10-CM | POA: Diagnosis not present

## 2019-02-07 DIAGNOSIS — R296 Repeated falls: Secondary | ICD-10-CM | POA: Diagnosis not present

## 2019-02-07 DIAGNOSIS — R2681 Unsteadiness on feet: Secondary | ICD-10-CM | POA: Diagnosis not present

## 2019-02-07 DIAGNOSIS — M6281 Muscle weakness (generalized): Secondary | ICD-10-CM | POA: Diagnosis not present

## 2019-02-11 ENCOUNTER — Encounter (HOSPITAL_COMMUNITY): Payer: Self-pay

## 2019-02-11 ENCOUNTER — Emergency Department (HOSPITAL_COMMUNITY)
Admission: EM | Admit: 2019-02-11 | Discharge: 2019-02-12 | Disposition: A | Discharge status: Skilled Nursing Facility | Payer: Medicare Other | Attending: Emergency Medicine | Admitting: Emergency Medicine

## 2019-02-11 ENCOUNTER — Other Ambulatory Visit: Payer: Self-pay

## 2019-02-11 DIAGNOSIS — F29 Unspecified psychosis not due to a substance or known physiological condition: Secondary | ICD-10-CM | POA: Diagnosis not present

## 2019-02-11 DIAGNOSIS — N3090 Cystitis, unspecified without hematuria: Secondary | ICD-10-CM | POA: Insufficient documentation

## 2019-02-11 DIAGNOSIS — F04 Amnestic disorder due to known physiological condition: Secondary | ICD-10-CM | POA: Diagnosis not present

## 2019-02-11 DIAGNOSIS — N3000 Acute cystitis without hematuria: Secondary | ICD-10-CM

## 2019-02-11 DIAGNOSIS — I10 Essential (primary) hypertension: Secondary | ICD-10-CM | POA: Diagnosis not present

## 2019-02-11 DIAGNOSIS — Z79899 Other long term (current) drug therapy: Secondary | ICD-10-CM | POA: Insufficient documentation

## 2019-02-11 DIAGNOSIS — F919 Conduct disorder, unspecified: Secondary | ICD-10-CM | POA: Diagnosis present

## 2019-02-11 DIAGNOSIS — R404 Transient alteration of awareness: Secondary | ICD-10-CM | POA: Diagnosis not present

## 2019-02-11 DIAGNOSIS — Z20828 Contact with and (suspected) exposure to other viral communicable diseases: Secondary | ICD-10-CM | POA: Diagnosis not present

## 2019-02-11 DIAGNOSIS — Z046 Encounter for general psychiatric examination, requested by authority: Secondary | ICD-10-CM | POA: Diagnosis not present

## 2019-02-11 LAB — URINALYSIS, ROUTINE W REFLEX MICROSCOPIC
Bilirubin Urine: NEGATIVE
Glucose, UA: NEGATIVE mg/dL
Hgb urine dipstick: NEGATIVE
Ketones, ur: NEGATIVE mg/dL
Nitrite: NEGATIVE
Protein, ur: NEGATIVE mg/dL
Specific Gravity, Urine: 1.019 (ref 1.005–1.030)
pH: 6 (ref 5.0–8.0)

## 2019-02-11 MED ORDER — CEPHALEXIN 500 MG PO CAPS: 500.0000 mg | ORAL_CAPSULE | Freq: Two times a day (BID) | ORAL | 0 refills | Status: AC

## 2019-02-11 MED ORDER — CEPHALEXIN 500 MG PO CAPS: 500.0000 mg | ORAL_CAPSULE | Freq: Once | ORAL | Status: DC

## 2019-02-11 NOTE — ED Notes (Signed)
Pt became upset and tearful stating "I just don't understand why they sent me here I have not done anything wrong".  I talked to pt and explained it was not her fault and we are here to take care of her. MD at bedside at this time.

## 2019-02-11 NOTE — ED Triage Notes (Signed)
Pt BIB EMS from [facility] at [facility]. Facility called out due to patient being "combative and verbally abusive". Facility requesting psych evaluation. Facility states she is at baseline and normally A&O x2. Pt was not combative at all with EMS. Pt states that staff at facility was mean to her.   HR 100 99% RA CBG 134

## 2019-02-11 NOTE — ED Provider Notes (Signed)
[facility] DEPT Provider Note   CSN: [id number] Arrival date & time: 02/11/19  2042     History   Chief Complaint Behavioral issue  HPI Sarah Gutierrez is a 73 y.o. female with past medical history significant for Korsakoff syndrome presents to emergency room today via EMS for chief complaint of behavioral issue.  Patient is a resident at [facility].  Facility called EMS because patient was combative verbally abusive.  They state she became angry and was laying on the floor rolling around.  On EMS arrival they state she was resting comfortably in bed.  In transport she was calm and cooperative.  Patient has no complaints at this time.  No medications prior to arrival.  She denies fever, chills, chest pain, shortness of breath, abdominal pain, nausea vomiting, urinary symptoms, diarrhea.  Also denies any recent fall or injury.    Past Medical History:  Diagnosis Date  . Hypertension     Patient Active Problem List   Diagnosis Date Noted  . Protein-calorie malnutrition, severe 05/29/2018  . Pressure injury of skin 05/29/2018  . AKI (acute kidney injury) ([place])   . Acute metabolic encephalopathy [id number]  . Dehydration 05/20/2018  . Hypercalcemia 05/20/2018  . Hypernatremia 05/20/2018  . Lactic acid acidosis 05/20/2018  . Elevated troponin 05/20/2018  . Hypertension     History reviewed. No pertinent surgical history.   OB History   No obstetric history on file.      Home Medications    Prior to Admission medications   Medication Sig Start Date End Date Taking? Authorizing Provider  acetaminophen (TYLENOL) 325 MG tablet Take 650 mg by mouth every 6 (six) hours as needed for mild pain.   Yes [provider]  amLODipine (NORVASC) 5 MG tablet Take 1 tablet (5 mg total) by mouth daily. 05/30/18  Yes Dessa Phi, DO  diphenhydrAMINE-zinc acetate (BENADRYL) cream Apply 1 application topically daily as needed for itching  (right palm).   Yes [provider]  donepezil (ARICEPT) 10 MG tablet Take 1/2 tablet daily for 2 weeks, then increase to 1 tablet daily and continue Patient taking differently: Take 10 mg by mouth at bedtime.  10/23/18  Yes Cameron Sprang, MD  loratadine (CLARITIN) 10 MG tablet Take 10 mg by mouth daily as needed for allergies.   Yes [provider]  Multiple Vitamins-Minerals (MULTIVITAMIN PO) Take 1 tablet by mouth daily.   Yes [provider]  ondansetron (ZOFRAN) 4 MG tablet Take 4 mg by mouth every 6 (six) hours as needed for nausea or vomiting.   Yes [provider]  polyethylene glycol (MIRALAX / GLYCOLAX) packet Take 17 g by mouth daily. 06/01/18  Yes Dessa Phi, DO  QUEtiapine (SEROQUEL) 25 MG tablet Take 1 tablet (25 mg total) by mouth daily as needed. Patient taking differently: Take 25 mg by mouth daily as needed (anxiety).  01/28/19  Yes Cameron Sprang, MD  senna-docusate (SENOKOT-S) 8.6-50 MG tablet Take 1 tablet by mouth daily. 06/01/18  Yes Dessa Phi, DO  bisacodyl (DULCOLAX) 10 MG suppository Place 1 suppository (10 mg total) rectally as needed for moderate constipation. Patient not taking: Reported on 02/11/2019 07/12/18   Hayden Rasmussen, MD  cephALEXin (KEFLEX) 500 MG capsule Take 1 capsule (500 mg total) by mouth 2 (two) times daily for 5 days. 02/11/19 02/16/19  Wyvonnia Dusky, MD  diazepam (VALIUM) 5 MG tablet Take 1 tablet 30 minutes prior to MRI, may take  second dose if needed Patient not taking: Reported on 02/11/2019 08/30/18   Cameron Sprang, MD  folic acid (FOLVITE) 1 MG tablet Take 1 tablet (1 mg total) by mouth daily. Patient not taking: Reported on 02/11/2019 05/30/18   Dessa Phi, DO  sertraline (ZOLOFT) 25 MG tablet Take 1 tablet (25 mg total) by mouth daily. Patient not taking: Reported on 02/11/2019 07/31/18   Cameron Sprang, MD  thiamine 100 MG tablet Take 1 tablet (100 mg total) by mouth daily. Patient not taking:  Reported on 02/11/2019 05/30/18   Dessa Phi, DO    Family History Family History  Problem Relation Age of Onset  . CAD Brother 44       has coronary stent     Social History Social History   Tobacco Use  . Smoking status: Never Smoker  . Smokeless tobacco: Never Used  Substance Use Topics  . 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 Drug use: Not on file     Allergies   Patient has no known allergies.   Review of Systems Review of Systems  Constitutional: Negative for chills and fever.  HENT: Negative for congestion, ear discharge, ear pain, sinus pressure, sinus pain and sore throat.   Eyes: Negative for pain and redness.  Respiratory: Negative for cough and shortness of breath.   Cardiovascular: Negative for chest pain.  Gastrointestinal: Negative for abdominal pain, constipation, diarrhea, nausea and vomiting.  Genitourinary: Negative for dysuria and hematuria.  Musculoskeletal: Negative for back pain and neck pain.  Skin: Negative for wound.  Neurological: Negative for weakness, numbness and headaches.     Physical Exam Updated Vital Signs BP 124/83   Pulse 93   Temp 98.4 F (36.9 C) (Oral)   Resp 20   SpO2 97%   Physical Exam Vitals signs and nursing note reviewed.  Constitutional:      General: She is not in acute distress.    Appearance: She is not ill-appearing.  HENT:     Head: Normocephalic and atraumatic.     Right Ear: Tympanic membrane and external ear normal.     Left Ear: Tympanic membrane and external ear normal.     Nose: Nose normal.     Mouth/Throat:     Mouth: Mucous membranes are moist.     Pharynx: Oropharynx is clear.  Eyes:     General: No scleral icterus.       Right eye: No discharge.        Left eye: No discharge.     Extraocular Movements: Extraocular movements intact.     Conjunctiva/sclera: Conjunctivae normal.     Pupils: Pupils are equal, round, and reactive to light.  Neck:      Musculoskeletal: Normal range of motion.     Vascular: No JVD.  Cardiovascular:     Rate and Rhythm: Normal rate and regular rhythm.     Pulses: Normal pulses.          Radial pulses are 2+ on the right side and 2+ on the left side.     Heart sounds: Normal heart sounds.  Pulmonary:     Comments: Lungs clear to auscultation in all fields. Symmetric chest rise. No wheezing, rales, or rhonchi. Abdominal:     Comments: Abdomen is soft, non-distended, and non-tender in all quadrants. No rigidity, no guarding. No peritoneal signs.  Musculoskeletal: Normal range of motion.  Skin:    General: Skin is warm  and dry.     Capillary Refill: Capillary refill takes less than 2 seconds.  Neurological:     Mental Status: She is oriented to person, place, and time.     GCS: GCS eye subscore is 4. GCS verbal subscore is 5. GCS motor subscore is 6.     Comments: Fluent speech, no facial droop.  Psychiatric:        Mood and Affect: Mood and affect normal.        Behavior: Behavior normal. Behavior is cooperative.      ED Treatments / Results  Labs (all labs ordered are listed, but only abnormal results are displayed) Labs Reviewed  URINALYSIS, ROUTINE W REFLEX MICROSCOPIC - Abnormal; Notable for the following components:      Result Value   APPearance CLOUDY (*)    Leukocytes,Ua MODERATE (*)    Bacteria, UA FEW (*)    All other components within normal limits  URINE CULTURE    EKG None  Radiology No results found.  Procedures Procedures (including critical care time)  Medications Ordered in ED Medications  cephALEXin (KEFLEX) capsule 500 mg (has no administration in time range)     Initial Impression / Assessment and Plan / ED Course  I have reviewed the triage vital signs and the nursing notes.  Pertinent labs & imaging results that were available during my care of the patient were reviewed by me and considered in my medical decision making (see chart for details).  Patient  seen and examined. Patient nontoxic appearing, in no apparent distress, vitals WNL.  Her exam is without any findings, very reassuring.  She is alert and oriented.  She did mention that she became upset after talking with staff earlier today.  The patient was discussed with and seen by Dr. Langston Masker who agrees with the treatment plan. Attending also spoke with pt's brother Dr. Orland Mustard, pt's legal guardian, who agrees with plan to check UA for infection. He has discussed her behavior with the facility and has been working to restrict her ED visits.  He was unable to be contacted tonight by nursing staff prior to sending to the ED.   UA shows possible infection with moderate leukocytes, 11-20 WBC. Will sned urine culture. Will treat with antibiotics.  The patient appears reasonably screened and/or stabilized for discharge and I doubt any other medical condition or other [facility] requiring further screening, evaluation, or treatment in the ED at this time prior to discharge. The patient is safe for discharge to facility with strict return precautions discussed with pt's legal guardian. This is shared visit with ED attending Dr. Langston Masker, he saw and evaluated pt.   Portions of this note were generated with [employer]. Dictation errors may occur despite best attempts at proofreading.    Final Clinical Impressions(s) / ED Diagnoses   Final diagnoses:  Acute cystitis without hematuria    ED Discharge Orders         Ordered    cephALEXin (KEFLEX) 500 MG capsule  2 times daily     02/11/19 2352           Cherre Robins, PA-C 02/12/19 0147    Wyvonnia Dusky, MD 02/12/19 [id number]

## 2019-02-11 NOTE — ED Notes (Signed)
Spoke with pt brother Dr. Orland Mustard, I expressed pt was calm and resting. He asked to be updated with plan of care.

## 2019-02-12 DIAGNOSIS — M6281 Muscle weakness (generalized): Secondary | ICD-10-CM | POA: Diagnosis not present

## 2019-02-12 DIAGNOSIS — R279 Unspecified lack of coordination: Secondary | ICD-10-CM | POA: Diagnosis not present

## 2019-02-12 DIAGNOSIS — R296 Repeated falls: Secondary | ICD-10-CM | POA: Diagnosis not present

## 2019-02-12 DIAGNOSIS — R2681 Unsteadiness on feet: Secondary | ICD-10-CM | POA: Diagnosis not present

## 2019-02-12 DIAGNOSIS — R404 Transient alteration of awareness: Secondary | ICD-10-CM | POA: Diagnosis not present

## 2019-02-12 DIAGNOSIS — Z743 Need for continuous supervision: Secondary | ICD-10-CM | POA: Diagnosis not present

## 2019-02-12 NOTE — Discharge Instructions (Addendum)
Sarah Gutierrez was found to have a UTI on her evaluation in the ED.  We recommend treatment with 5 days of Keflex.    I spoke to her brother Dr. Orland Mustard and discussed her history and behavioral problems.  We both felt that it would be better to manage these chronic issues outside of the hospital and the emergency department.  Involuntary commitments and prolonged stays in the ER are known triggers of stress in patients with neurological and psychiatric conditions.  The ER is also a high risk environment for COVID-19.  Therefore we strongly recommend outpatient management whenever possible.

## 2019-02-13 ENCOUNTER — Other Ambulatory Visit: Payer: Self-pay

## 2019-02-13 ENCOUNTER — Emergency Department (HOSPITAL_COMMUNITY)
Admission: EM | Admit: 2019-02-13 | Discharge: 2019-02-14 | Disposition: A | Discharge status: Home / Self Care | Payer: Medicare Other | Attending: Emergency Medicine | Admitting: Emergency Medicine

## 2019-02-13 DIAGNOSIS — R404 Transient alteration of awareness: Secondary | ICD-10-CM | POA: Diagnosis not present

## 2019-02-13 DIAGNOSIS — F0390 Unspecified dementia without behavioral disturbance: Secondary | ICD-10-CM | POA: Insufficient documentation

## 2019-02-13 DIAGNOSIS — I10 Essential (primary) hypertension: Secondary | ICD-10-CM | POA: Diagnosis not present

## 2019-02-13 DIAGNOSIS — R451 Restlessness and agitation: Secondary | ICD-10-CM | POA: Diagnosis not present

## 2019-02-13 DIAGNOSIS — R220 Localized swelling, mass and lump, head: Secondary | ICD-10-CM | POA: Diagnosis not present

## 2019-02-13 DIAGNOSIS — Z20828 Contact with and (suspected) exposure to other viral communicable diseases: Secondary | ICD-10-CM | POA: Diagnosis not present

## 2019-02-13 DIAGNOSIS — F29 Unspecified psychosis not due to a substance or known physiological condition: Secondary | ICD-10-CM | POA: Diagnosis not present

## 2019-02-13 DIAGNOSIS — S0990XA Unspecified injury of head, initial encounter: Secondary | ICD-10-CM | POA: Diagnosis not present

## 2019-02-13 DIAGNOSIS — Z79899 Other long term (current) drug therapy: Secondary | ICD-10-CM | POA: Insufficient documentation

## 2019-02-13 DIAGNOSIS — Z03818 Encounter for observation for suspected exposure to other biological agents ruled out: Secondary | ICD-10-CM | POA: Diagnosis not present

## 2019-02-13 LAB — URINE CULTURE

## 2019-02-13 NOTE — ED Notes (Signed)
Pt becoming increasing confused and agitated

## 2019-02-13 NOTE — ED Notes (Signed)
Pt attempted to get out of bed. Pt is upset and agitated. A posie belt was placed on the pt for her safety.

## 2019-02-13 NOTE — ED Notes (Addendum)
PT visibly frustrated in the hallway. Pt attempting to crawl out of bed and shaking the bed rail. Pt is crying, and stating that "this is not a joke." Pt is able to be verbally redirected, however as soon as you leave pt becomes upset immediatly. Pt also stating that "My parents are doctor I dont understand why they sent me here," "I honestly believe that this is a joke". Pt is alert and oriented 3x, pt unable to explain why she is here. When asked if the pt has any needs or what we can do for her pt states "I just need to go home." It was explained to the pt several times that as soon as we can we would arrange for the pt to go home.

## 2019-02-13 NOTE — ED Provider Notes (Addendum)
TIME SEEN: 11:52 PM  CHIEF COMPLAINT: Aggressive behavior  HPI: Patient is a 73 year old female with history of hypertension, Korsakoff's who presents to the emergency department from [facility] with aggressive behavior.  Per staff, patient was banging her head against the wall.  She has been intermittently tearful and then aggressive here in the emergency department.  Patient denies SI, HI.  She is not sure why she is here in the emergency department and is requesting to go home.  She appears confused.  Spoke with nursing staff, Charlena Cross  [phone]).  Over the weekend, patient was very agitated.  Seen at [facility] ED on 9/29 and was diagnosed with UTI and on Keflex.  Tonight she was throwing things and banging head on wall.  This is new behavior for her per nursing home staff who have been with her for several years.  They state the police had to be called because patient was spitting, kicking and hitting staff.   ROS: Level 5 caveat for altered mental status  PAST MEDICAL HISTORY/PAST SURGICAL HISTORY:  Past Medical History:  Diagnosis Date  . Hypertension     MEDICATIONS:  Prior to Admission medications   Medication Sig Start Date End Date Taking? Authorizing Provider  acetaminophen (TYLENOL) 325 MG tablet Take 650 mg by mouth every 6 (six) hours as needed for mild pain.   Yes [provider]  amLODipine (NORVASC) 5 MG tablet Take 1 tablet (5 mg total) by mouth daily. 05/30/18  Yes Dessa Phi, DO  cephALEXin (KEFLEX) 500 MG capsule Take 1 capsule (500 mg total) by mouth 2 (two) times daily for 5 days. 02/11/19 02/16/19 Yes Trifan, Carola Rhine, MD  diphenhydrAMINE-zinc acetate (BENADRYL) cream Apply 1 application topically daily as needed for itching (right palm).   Yes [provider]  donepezil (ARICEPT) 10 MG tablet Take 1/2 tablet daily for 2 weeks, then increase to 1 tablet daily and continue Patient taking differently: Take 10 mg by mouth at bedtime.   10/23/18  Yes Cameron Sprang, MD  Ensure (ENSURE) Take 237 mLs by mouth 3 (three) times daily between meals.   Yes [provider]  loratadine (CLARITIN) 10 MG tablet Take 10 mg by mouth daily as needed for allergies.   Yes [provider]  Multiple Vitamins-Minerals (MULTIVITAMIN PO) Take 1 tablet by mouth daily.   Yes [provider]  ondansetron (ZOFRAN) 4 MG tablet Take 4 mg by mouth every 6 (six) hours as needed for nausea or vomiting.   Yes [provider]  polyethylene glycol (MIRALAX / GLYCOLAX) packet Take 17 g by mouth daily. 06/01/18  Yes Dessa Phi, DO  QUEtiapine (SEROQUEL) 25 MG tablet Take 1 tablet (25 mg total) by mouth daily as needed. Patient taking differently: Take 25 mg by mouth daily as needed (anxiety).  01/28/19  Yes Cameron Sprang, MD  senna-docusate (SENOKOT-S) 8.6-50 MG tablet Take 1 tablet by mouth daily. Patient taking differently: Take 2 tablets by mouth daily.  06/01/18  Yes Dessa Phi, DO  sertraline (ZOLOFT) 50 MG tablet Take 50 mg by mouth daily.   Yes [provider]  bisacodyl (DULCOLAX) 10 MG suppository Place 1 suppository (10 mg total) rectally as needed for moderate constipation. Patient not taking: Reported on 02/11/2019 07/12/18   Hayden Rasmussen, MD  diazepam (VALIUM) 5 MG tablet Take 1 tablet 30 minutes prior to MRI, may take second dose if needed Patient not taking: Reported on 02/11/2019 08/30/18   Ellouise Newer  M, MD  folic acid (FOLVITE) 1 MG tablet Take 1 tablet (1 mg total) by mouth daily. Patient not taking: Reported on 02/11/2019 05/30/18   Dessa Phi, DO  sertraline (ZOLOFT) 25 MG tablet Take 1 tablet (25 mg total) by mouth daily. Patient not taking: Reported on 02/11/2019 07/31/18   Cameron Sprang, MD  thiamine 100 MG tablet Take 1 tablet (100 mg total) by mouth daily. Patient not taking: Reported on 02/11/2019 05/30/18   Dessa Phi, DO    ALLERGIES:  Allergies  Allergen Reactions  .  Oysters [employer] Allergy] Other (See Comments)    On MAR    SOCIAL HISTORY:  Social History   Tobacco Use  . Smoking status: Never Smoker  . Smokeless tobacco: Never Used  Substance Use Topics  . Alcohol use: Yes    Comment: hx alcohol abuse per family    FAMILY HISTORY: Family History  Problem Relation Age of Onset  . CAD Brother 45       has coronary stent     EXAM: BP (!) 197/87 (BP Location: Right Arm)   Pulse (!) 107   Temp 98 F (36.7 C)   Resp 19   SpO2 99%  CONSTITUTIONAL: Alert and oriented to person, place and time but not oriented to situation.  Appears confused.  Elderly, thin.  Intermittently tearful. HEAD: Normocephalic EYES: Conjunctivae clear, pupils appear equal, EOMI ENT: normal nose; moist mucous membranes NECK: Supple, no meningismus, no nuchal rigidity, no LAD  CARD: Regular and tachycardic; S1 and S2 appreciated; no murmurs, no clicks, no rubs, no gallops RESP: Normal chest excursion without splinting or tachypnea; breath sounds clear and equal bilaterally; no wheezes, no rhonchi, no rales, no hypoxia or respiratory distress, speaking full sentences ABD/GI: Normal bowel sounds; non-distended; soft, non-tender, no rebound, no guarding, no peritoneal signs, no hepatosplenomegaly BACK:  The back appears normal and is non-tender to palpation, there is no CVA tenderness EXT: Normal ROM in all joints; non-tender to palpation; no edema; normal capillary refill; no cyanosis, no calf tenderness or swelling    SKIN: Normal color for age and race; warm; no rash NEURO: Moves all extremities equally, no facial asymmetry, normal speech PSYCH: Intermittently yelling, aggressive towards staff.  Denies SI, HI.  MEDICAL DECISION MAKING: Patient here with aggressive behavior at her nursing facility.  Intermittently aggressive here in the emergency department.  Vital signs pending.  Will obtain screening labs, urine and TTS evaluation for possible [facility] psych  placement.  ED PROGRESS: Patient becoming increasingly aggressive here.  I feel she is a danger to herself and staff.  Will give IM Geodon.   4:30 AM  Patient did not have to receive IM Geodon.  She is now resting comfortably and redirectable verbally.  EKG shows no ischemic abnormality.  Urine currently pending for medical clearance.  Labs unremarkable.  Head CT negative for acute abnormality other than a right frontal scalp contusion without calvarial fracture.  She also has what appears to be cerumen impaction bilaterally.  Patient does not tolerate exam with otoscope and will not tolerate irrigation or removal of cerumen.   Urine shows no sign of infection.  Drug screen negative.  Patient medically cleared.  Awaiting TTS evaluation.  Patient may benefit from [facility] psychiatric placement.   Left message with patient's brother at [phone].  Attempting to get corroborating information and help with plan of care.  It appears during her recent ED visit, brother was contacted and reported aggressive behavior was something  that was ongoing for patient for several months.  This contradicts what nursing home staff reports.  Patient has also been intermittently very aggressive here in the ED.   6:55 AM  Spoke with Dr. Orland Mustard.  Patient put on seroquel prn 10 days ago.  He states that this agitation has been present for about a month intermittently.  He understands that the facility is concerned about the increasing behavior.  There is a psychologist that will be seeing her tomorrow at the facility.  She has a neurologist that the brother will contact as well.  Brother would like prn Seroquel increased or made not a prn medication.  He states that the facility never gave the patient this medication when she became agitated tonight.  We discussed that he does not feel the patient would benefit from [facility] psychiatric placement and would like her to go back to her facility.  We have discussed the plan to start  her on Seroquel 25 mg at night with the ability to give an extra 25 mg 1 hour later as needed for agitation.  Discussed return precautions.  He is comfortable with this plan.  I reviewed all nursing notes, vitals, pertinent previous records, EKGs, lab and urine results, imaging (as available).    EKG Interpretation  Date/Time:  Friday February 14 2019 04:15:14 EDT Ventricular Rate:  69 PR Interval:    QRS Duration: 71 QT Interval:  391 QTC Calculation: 419 R Axis:   71 Text Interpretation:  Sinus rhythm Left ventricular hypertrophy Baseline wander in lead(s) V1 No significant change since last tracing Confirmed by Pryor Curia [id number]) on 02/14/2019 4:30:09 AM        Judson Roch Lamar Blinks was evaluated in Emergency Department on 02/13/2019 for the symptoms described in the history of present illness. She was evaluated in the context of the global COVID-19 pandemic, which necessitated consideration that the patient might be at risk for infection with the SARS-CoV-2 virus that causes COVID-19. Institutional protocols and algorithms that pertain to the evaluation of patients at risk for COVID-19 are in a state of rapid change based on information released by regulatory bodies including the CDC and federal and state organizations. These policies and algorithms were followed during the patient's care in the ED.        Ward, Delice Bison, DO 02/14/19 [place], Delice Bison, DO 02/14/19 [place], Delice Bison, DO 02/14/19 [phone]

## 2019-02-13 NOTE — ED Triage Notes (Signed)
73 yo female brought in by EMS from [facility]. Pts POA is Harvel Quale [phone]. Facility called PD out earlier due patient having episodes of self harm and banging her head on the wall. As per EMS there are some discrepancies with which medications patient has been given at her facility. Pt is confused but is not displaying any aggressive behavior as per EMS. EMS states pt is AOx4 with short term memory loss. No other symptoms.  Vitals: Bp 177/76 Hr 77 rr 16 spo02 98 on room air

## 2019-02-14 ENCOUNTER — Emergency Department (HOSPITAL_COMMUNITY): Payer: Medicare Other

## 2019-02-14 DIAGNOSIS — S0990XA Unspecified injury of head, initial encounter: Secondary | ICD-10-CM | POA: Diagnosis not present

## 2019-02-14 DIAGNOSIS — M2550 Pain in unspecified joint: Secondary | ICD-10-CM | POA: Diagnosis not present

## 2019-02-14 DIAGNOSIS — Z7401 Bed confinement status: Secondary | ICD-10-CM | POA: Diagnosis not present

## 2019-02-14 DIAGNOSIS — R296 Repeated falls: Secondary | ICD-10-CM | POA: Diagnosis not present

## 2019-02-14 DIAGNOSIS — R220 Localized swelling, mass and lump, head: Secondary | ICD-10-CM | POA: Diagnosis not present

## 2019-02-14 DIAGNOSIS — R5381 Other malaise: Secondary | ICD-10-CM | POA: Diagnosis not present

## 2019-02-14 DIAGNOSIS — M6281 Muscle weakness (generalized): Secondary | ICD-10-CM | POA: Diagnosis not present

## 2019-02-14 DIAGNOSIS — R2681 Unsteadiness on feet: Secondary | ICD-10-CM | POA: Diagnosis not present

## 2019-02-14 LAB — COMPREHENSIVE METABOLIC PANEL
ALT: 21 U/L (ref 0–44)
AST: 29 U/L (ref 15–41)
Albumin: 4.7 g/dL (ref 3.5–5.0)
Alkaline Phosphatase: 119 U/L (ref 38–126)
Anion gap: 15 (ref 5–15)
BUN: 24 mg/dL — ABNORMAL HIGH (ref 8–23)
CO2: 19 mmol/L — ABNORMAL LOW (ref 22–32)
Calcium: 10.3 mg/dL (ref 8.9–10.3)
Chloride: 106 mmol/L (ref 98–111)
Creatinine, Ser: 0.93 mg/dL (ref 0.44–1.00)
GFR calc Af Amer: >60 mL/min (ref >60)
GFR calc non Af Amer: >60 mL/min (ref >60)
Glucose, Bld: 122 mg/dL — ABNORMAL HIGH (ref 70–99)
Potassium: 3.6 mmol/L (ref 3.5–5.1)
Sodium: 140 mmol/L (ref 135–145)
Total Bilirubin: 0.5 mg/dL (ref 0.3–1.2)
Total Protein: 7.9 g/dL (ref 6.5–8.1)

## 2019-02-14 LAB — URINALYSIS, ROUTINE W REFLEX MICROSCOPIC
Bilirubin Urine: NEGATIVE
Glucose, UA: NEGATIVE mg/dL
Hgb urine dipstick: NEGATIVE
Ketones, ur: NEGATIVE mg/dL
Leukocytes,Ua: NEGATIVE
Nitrite: NEGATIVE
Protein, ur: 30 mg/dL — AB
Specific Gravity, Urine: 1.018 (ref 1.005–1.030)
pH: 7 (ref 5.0–8.0)

## 2019-02-14 LAB — ETHANOL: Alcohol, Ethyl (B): <10 mg/dL (ref <10)

## 2019-02-14 LAB — CBC
HCT: 39.9 % (ref 36.0–46.0)
Hemoglobin: 12.7 g/dL (ref 12.0–15.0)
MCH: 30 pg (ref 26.0–34.0)
MCHC: 31.8 g/dL (ref 30.0–36.0)
MCV: 94.3 fL (ref 80.0–100.0)
Platelets: 290 10*3/uL (ref 150–400)
RBC: 4.23 MIL/uL (ref 3.87–5.11)
RDW: 13.2 % (ref 11.5–15.5)
WBC: 9.2 10*3/uL (ref 4.0–10.5)
nRBC: 0 % (ref 0.0–0.2)

## 2019-02-14 LAB — RAPID URINE DRUG SCREEN, HOSP PERFORMED
Amphetamines: NOT DETECTED
Barbiturates: NOT DETECTED
Benzodiazepines: NOT DETECTED
Cocaine: NOT DETECTED
Opiates: NOT DETECTED
Tetrahydrocannabinol: NOT DETECTED

## 2019-02-14 LAB — SARS CORONAVIRUS 2 BY RT PCR (HOSPITAL ORDER, PERFORMED IN ~~LOC~~ HOSPITAL LAB): SARS Coronavirus 2: NEGATIVE

## 2019-02-14 MED ORDER — QUETIAPINE FUMARATE 25 MG PO TABS: 25.0000 mg | ORAL_TABLET | ORAL | 0 refills | Status: DC | PRN

## 2019-02-14 MED ORDER — ACETAMINOPHEN 325 MG PO TABS: 650.0000 mg | ORAL_TABLET | Freq: Four times a day (QID) | ORAL | Status: DC | PRN

## 2019-02-14 MED ORDER — STERILE WATER FOR INJECTION IJ SOLN
INTRAMUSCULAR | Status: AC
  Filled 2019-02-14: qty 10

## 2019-02-14 MED ORDER — QUETIAPINE FUMARATE 25 MG PO TABS: 25.0000 mg | ORAL_TABLET | Freq: Every day | ORAL | Status: DC | PRN

## 2019-02-14 MED ORDER — ONDANSETRON HCL 4 MG PO TABS: 4.0000 mg | ORAL_TABLET | Freq: Four times a day (QID) | ORAL | Status: DC | PRN

## 2019-02-14 MED ORDER — ADULT MULTIVITAMIN W/MINERALS CH: 1.0000 | ORAL_TABLET | Freq: Every day | ORAL | Status: DC

## 2019-02-14 MED ORDER — QUETIAPINE FUMARATE 25 MG PO TABS: 25.0000 mg | ORAL_TABLET | Freq: Every day | ORAL | 0 refills | Status: DC

## 2019-02-14 MED ORDER — SERTRALINE HCL 50 MG PO TABS: 50.0000 mg | ORAL_TABLET | Freq: Every day | ORAL | Status: DC

## 2019-02-14 MED ORDER — DIPHENHYDRAMINE-ZINC ACETATE 2-0.1 % EX CREA: 1.0000 "application " | TOPICAL_CREAM | Freq: Every day | CUTANEOUS | Status: DC | PRN

## 2019-02-14 MED ORDER — AMLODIPINE BESYLATE 5 MG PO TABS: 5.0000 mg | ORAL_TABLET | Freq: Every day | ORAL | Status: DC

## 2019-02-14 MED ORDER — DONEPEZIL HCL 5 MG PO TABS: 10.0000 mg | ORAL_TABLET | Freq: Every day | ORAL | Status: DC

## 2019-02-14 MED ORDER — STERILE WATER FOR INJECTION IJ SOLN
INTRAMUSCULAR | Status: AC
  Administered 2019-02-14: 05:00:00
  Filled 2019-02-14: qty 10

## 2019-02-14 MED ORDER — LORATADINE 10 MG PO TABS: 10.0000 mg | ORAL_TABLET | Freq: Every day | ORAL | Status: DC | PRN

## 2019-02-14 MED ORDER — ENSURE ENLIVE PO LIQD
237.0000 mL | Freq: Three times a day (TID) | ORAL | Status: DC
  Filled 2019-02-14 (×2): qty 237

## 2019-02-14 MED ORDER — SENNOSIDES-DOCUSATE SODIUM 8.6-50 MG PO TABS: 2.0000 | ORAL_TABLET | Freq: Every day | ORAL | Status: DC

## 2019-02-14 MED ORDER — POLYETHYLENE GLYCOL 3350 17 G PO PACK: 17.0000 g | PACK | Freq: Every day | ORAL | Status: DC

## 2019-02-14 MED ORDER — ZIPRASIDONE MESYLATE 20 MG IM SOLR
10.0000 mg | Freq: Once | INTRAMUSCULAR | Status: DC
  Filled 2019-02-14 (×2): qty 20

## 2019-02-14 MED ORDER — CEPHALEXIN 500 MG PO CAPS: 500.0000 mg | ORAL_CAPSULE | Freq: Two times a day (BID) | ORAL | Status: DC

## 2019-02-14 NOTE — ED Notes (Signed)
Pt transported to CT ?

## 2019-02-14 NOTE — Discharge Instructions (Addendum)
Patient's labs, head CT and urine today were normal.  COVID swab was negative.  Patient psychologist will see her tomorrow.  Plan is to start her Seroquel every night with option to give an extra dose 1 hour later as needed for agitation.

## 2019-02-14 NOTE — Progress Notes (Signed)
Pt in hall bed. TTS spoke with pt's nurse who states he will call back when pt is in a private room and ready to be seen. TTS moving to next pt waiting in que.  Lind Covert, MSW, LCSW Therapeutic Triage Specialist  [phone]

## 2019-02-14 NOTE — ED Notes (Signed)
Pt resting comfortably

## 2019-02-15 DIAGNOSIS — F331 Major depressive disorder, recurrent, moderate: Secondary | ICD-10-CM | POA: Diagnosis not present

## 2019-02-17 DIAGNOSIS — R2681 Unsteadiness on feet: Secondary | ICD-10-CM | POA: Diagnosis not present

## 2019-02-17 DIAGNOSIS — M6281 Muscle weakness (generalized): Secondary | ICD-10-CM | POA: Diagnosis not present

## 2019-02-17 DIAGNOSIS — R296 Repeated falls: Secondary | ICD-10-CM | POA: Diagnosis not present

## 2019-02-20 DIAGNOSIS — M6281 Muscle weakness (generalized): Secondary | ICD-10-CM | POA: Diagnosis not present

## 2019-02-20 DIAGNOSIS — R296 Repeated falls: Secondary | ICD-10-CM | POA: Diagnosis not present

## 2019-02-20 DIAGNOSIS — R2681 Unsteadiness on feet: Secondary | ICD-10-CM | POA: Diagnosis not present

## 2019-02-21 DIAGNOSIS — R296 Repeated falls: Secondary | ICD-10-CM | POA: Diagnosis not present

## 2019-02-21 DIAGNOSIS — R2681 Unsteadiness on feet: Secondary | ICD-10-CM | POA: Diagnosis not present

## 2019-02-21 DIAGNOSIS — M6281 Muscle weakness (generalized): Secondary | ICD-10-CM | POA: Diagnosis not present

## 2019-02-21 DIAGNOSIS — Z23 Encounter for immunization: Secondary | ICD-10-CM | POA: Diagnosis not present

## 2019-02-22 DIAGNOSIS — F331 Major depressive disorder, recurrent, moderate: Secondary | ICD-10-CM | POA: Diagnosis not present

## 2019-02-24 DIAGNOSIS — R2681 Unsteadiness on feet: Secondary | ICD-10-CM | POA: Diagnosis not present

## 2019-02-24 DIAGNOSIS — R296 Repeated falls: Secondary | ICD-10-CM | POA: Diagnosis not present

## 2019-02-24 DIAGNOSIS — M6281 Muscle weakness (generalized): Secondary | ICD-10-CM | POA: Diagnosis not present

## 2019-02-25 DIAGNOSIS — Z20828 Contact with and (suspected) exposure to other viral communicable diseases: Secondary | ICD-10-CM | POA: Diagnosis not present

## 2019-02-26 DIAGNOSIS — R2681 Unsteadiness on feet: Secondary | ICD-10-CM | POA: Diagnosis not present

## 2019-02-26 DIAGNOSIS — M6281 Muscle weakness (generalized): Secondary | ICD-10-CM | POA: Diagnosis not present

## 2019-02-26 DIAGNOSIS — R296 Repeated falls: Secondary | ICD-10-CM | POA: Diagnosis not present

## 2019-02-28 DIAGNOSIS — R296 Repeated falls: Secondary | ICD-10-CM | POA: Diagnosis not present

## 2019-02-28 DIAGNOSIS — R2681 Unsteadiness on feet: Secondary | ICD-10-CM | POA: Diagnosis not present

## 2019-02-28 DIAGNOSIS — M6281 Muscle weakness (generalized): Secondary | ICD-10-CM | POA: Diagnosis not present

## 2019-03-03 DIAGNOSIS — R2681 Unsteadiness on feet: Secondary | ICD-10-CM | POA: Diagnosis not present

## 2019-03-03 DIAGNOSIS — M6281 Muscle weakness (generalized): Secondary | ICD-10-CM | POA: Diagnosis not present

## 2019-03-03 DIAGNOSIS — R296 Repeated falls: Secondary | ICD-10-CM | POA: Diagnosis not present

## 2019-03-04 DIAGNOSIS — R296 Repeated falls: Secondary | ICD-10-CM | POA: Diagnosis not present

## 2019-03-04 DIAGNOSIS — R2681 Unsteadiness on feet: Secondary | ICD-10-CM | POA: Diagnosis not present

## 2019-03-04 DIAGNOSIS — Z79899 Other long term (current) drug therapy: Secondary | ICD-10-CM | POA: Diagnosis not present

## 2019-03-04 DIAGNOSIS — M6281 Muscle weakness (generalized): Secondary | ICD-10-CM | POA: Diagnosis not present

## 2019-03-04 DIAGNOSIS — I10 Essential (primary) hypertension: Secondary | ICD-10-CM | POA: Diagnosis not present

## 2019-03-07 DIAGNOSIS — R296 Repeated falls: Secondary | ICD-10-CM | POA: Diagnosis not present

## 2019-03-07 DIAGNOSIS — R2681 Unsteadiness on feet: Secondary | ICD-10-CM | POA: Diagnosis not present

## 2019-03-07 DIAGNOSIS — M6281 Muscle weakness (generalized): Secondary | ICD-10-CM | POA: Diagnosis not present

## 2019-03-10 DIAGNOSIS — I739 Peripheral vascular disease, unspecified: Secondary | ICD-10-CM | POA: Diagnosis not present

## 2019-03-10 DIAGNOSIS — R296 Repeated falls: Secondary | ICD-10-CM | POA: Diagnosis not present

## 2019-03-10 DIAGNOSIS — M6281 Muscle weakness (generalized): Secondary | ICD-10-CM | POA: Diagnosis not present

## 2019-03-10 DIAGNOSIS — B351 Tinea unguium: Secondary | ICD-10-CM | POA: Diagnosis not present

## 2019-03-10 DIAGNOSIS — R2681 Unsteadiness on feet: Secondary | ICD-10-CM | POA: Diagnosis not present

## 2019-03-13 DIAGNOSIS — M6281 Muscle weakness (generalized): Secondary | ICD-10-CM | POA: Diagnosis not present

## 2019-03-13 DIAGNOSIS — R296 Repeated falls: Secondary | ICD-10-CM | POA: Diagnosis not present

## 2019-03-13 DIAGNOSIS — R2681 Unsteadiness on feet: Secondary | ICD-10-CM | POA: Diagnosis not present

## 2019-03-14 DIAGNOSIS — M6281 Muscle weakness (generalized): Secondary | ICD-10-CM | POA: Diagnosis not present

## 2019-03-14 DIAGNOSIS — R296 Repeated falls: Secondary | ICD-10-CM | POA: Diagnosis not present

## 2019-03-14 DIAGNOSIS — R2681 Unsteadiness on feet: Secondary | ICD-10-CM | POA: Diagnosis not present

## 2019-03-17 DIAGNOSIS — R2681 Unsteadiness on feet: Secondary | ICD-10-CM | POA: Diagnosis not present

## 2019-03-17 DIAGNOSIS — R296 Repeated falls: Secondary | ICD-10-CM | POA: Diagnosis not present

## 2019-03-17 DIAGNOSIS — M6281 Muscle weakness (generalized): Secondary | ICD-10-CM | POA: Diagnosis not present

## 2019-03-19 DIAGNOSIS — M6281 Muscle weakness (generalized): Secondary | ICD-10-CM | POA: Diagnosis not present

## 2019-03-19 DIAGNOSIS — R2681 Unsteadiness on feet: Secondary | ICD-10-CM | POA: Diagnosis not present

## 2019-03-19 DIAGNOSIS — R296 Repeated falls: Secondary | ICD-10-CM | POA: Diagnosis not present

## 2019-03-22 DIAGNOSIS — R296 Repeated falls: Secondary | ICD-10-CM | POA: Diagnosis not present

## 2019-03-22 DIAGNOSIS — R2681 Unsteadiness on feet: Secondary | ICD-10-CM | POA: Diagnosis not present

## 2019-03-22 DIAGNOSIS — M6281 Muscle weakness (generalized): Secondary | ICD-10-CM | POA: Diagnosis not present

## 2019-03-24 DIAGNOSIS — M6281 Muscle weakness (generalized): Secondary | ICD-10-CM | POA: Diagnosis not present

## 2019-03-24 DIAGNOSIS — R296 Repeated falls: Secondary | ICD-10-CM | POA: Diagnosis not present

## 2019-03-24 DIAGNOSIS — R2681 Unsteadiness on feet: Secondary | ICD-10-CM | POA: Diagnosis not present

## 2019-03-26 DIAGNOSIS — R2681 Unsteadiness on feet: Secondary | ICD-10-CM | POA: Diagnosis not present

## 2019-03-26 DIAGNOSIS — R296 Repeated falls: Secondary | ICD-10-CM | POA: Diagnosis not present

## 2019-03-26 DIAGNOSIS — M6281 Muscle weakness (generalized): Secondary | ICD-10-CM | POA: Diagnosis not present

## 2019-03-27 DIAGNOSIS — R296 Repeated falls: Secondary | ICD-10-CM | POA: Diagnosis not present

## 2019-03-27 DIAGNOSIS — R2681 Unsteadiness on feet: Secondary | ICD-10-CM | POA: Diagnosis not present

## 2019-03-27 DIAGNOSIS — M6281 Muscle weakness (generalized): Secondary | ICD-10-CM | POA: Diagnosis not present

## 2019-03-29 DIAGNOSIS — M6281 Muscle weakness (generalized): Secondary | ICD-10-CM | POA: Diagnosis not present

## 2019-03-29 DIAGNOSIS — R296 Repeated falls: Secondary | ICD-10-CM | POA: Diagnosis not present

## 2019-03-29 DIAGNOSIS — R2681 Unsteadiness on feet: Secondary | ICD-10-CM | POA: Diagnosis not present

## 2019-04-04 DIAGNOSIS — R2681 Unsteadiness on feet: Secondary | ICD-10-CM | POA: Diagnosis not present

## 2019-04-04 DIAGNOSIS — M6281 Muscle weakness (generalized): Secondary | ICD-10-CM | POA: Diagnosis not present

## 2019-04-04 DIAGNOSIS — R296 Repeated falls: Secondary | ICD-10-CM | POA: Diagnosis not present

## 2019-04-07 DIAGNOSIS — R296 Repeated falls: Secondary | ICD-10-CM | POA: Diagnosis not present

## 2019-04-07 DIAGNOSIS — M6281 Muscle weakness (generalized): Secondary | ICD-10-CM | POA: Diagnosis not present

## 2019-04-07 DIAGNOSIS — R2681 Unsteadiness on feet: Secondary | ICD-10-CM | POA: Diagnosis not present

## 2019-04-08 DIAGNOSIS — R2681 Unsteadiness on feet: Secondary | ICD-10-CM | POA: Diagnosis not present

## 2019-04-08 DIAGNOSIS — R296 Repeated falls: Secondary | ICD-10-CM | POA: Diagnosis not present

## 2019-04-08 DIAGNOSIS — M6281 Muscle weakness (generalized): Secondary | ICD-10-CM | POA: Diagnosis not present

## 2019-04-09 DIAGNOSIS — M6281 Muscle weakness (generalized): Secondary | ICD-10-CM | POA: Diagnosis not present

## 2019-04-09 DIAGNOSIS — R296 Repeated falls: Secondary | ICD-10-CM | POA: Diagnosis not present

## 2019-04-09 DIAGNOSIS — R2681 Unsteadiness on feet: Secondary | ICD-10-CM | POA: Diagnosis not present

## 2019-04-14 DIAGNOSIS — M6281 Muscle weakness (generalized): Secondary | ICD-10-CM | POA: Diagnosis not present

## 2019-04-14 DIAGNOSIS — R296 Repeated falls: Secondary | ICD-10-CM | POA: Diagnosis not present

## 2019-04-14 DIAGNOSIS — R2681 Unsteadiness on feet: Secondary | ICD-10-CM | POA: Diagnosis not present

## 2019-07-21 ENCOUNTER — Encounter: Payer: Self-pay | Admitting: Neurology

## 2019-07-21 ENCOUNTER — Ambulatory Visit (INDEPENDENT_AMBULATORY_CARE_PROVIDER_SITE_OTHER): Payer: Medicare Other | Admitting: Neurology

## 2019-07-21 ENCOUNTER — Other Ambulatory Visit: Payer: Self-pay

## 2019-07-21 VITALS — BP 162/84 | HR 80 | Ht 68.0 in | Wt 124.2 lb

## 2019-07-21 DIAGNOSIS — F0391 Unspecified dementia with behavioral disturbance: Secondary | ICD-10-CM | POA: Diagnosis not present

## 2019-07-21 DIAGNOSIS — E512 Wernicke's encephalopathy: Secondary | ICD-10-CM

## 2019-07-21 DIAGNOSIS — F03B18 Unspecified dementia, moderate, with other behavioral disturbance: Secondary | ICD-10-CM

## 2019-07-21 NOTE — Progress Notes (Signed)
NEUROLOGY FOLLOW UP OFFICE NOTE  Sarah Gutierrez JY:[id number] [birth year]  HISTORY OF PRESENT ILLNESS: I had the pleasure of seeing Sarah Gutierrez in follow-up in the neurology clinic on 07/21/2019.  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 She was admitted for acute mental status change last January 2020 with prolonged encephalopathy with with nystagmus and gait ataxia. 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 MMSE 13/30 in March 2020. She was started on Sertraline 50mg  daily. I personally reviewed MRI brain with and without contrast done 08/2018 which did not show any acute changes. There was mild diffuse atrophy and minimal chronic microvascular disease. Repeat thiamine level in 11/2018 was 193 (ref 70-180). She was started on Donepezil 10mg  daily in June 2020, no side effects. In September, her brother contacted our office to report several episodes of acute anxiety where she cries, gets on the floor, yelling at staff, inconsolable. She calmed down after 90 minutes. She was started on Seroquel which did help. She is on Seroquel 25mg  qhs, her brother reports that since dose increase, she has had more slurred speech and some giddiness. No paranoia or hallucinations.   Since her last visit, Dr. Orland Mustard reports that her memory seems a little better recently. She continues to confabulate, saying she helps others in the SNF. She has been isolated in her room during the peak of the pandemic, the dining hall was only opened last week. She is able to feed herself. She needs assistance with dressing. She is unable to ambulate independently, PT comes weekly, she needs assist with her walker. She denies any headaches, dizziness, diplopia, focal numbness/tinglng/weakness. Sleep is good.   History on Initial Assessment 07/31/2018: This is a pleasant 74 year old right-handed woman with a history of hypertension, remote  alcohol abuse, presenting for evaluation of continued mental status changes. Records from her hospitalization and report from her brother were reviewed, this appears to be quite an acute change for her. She had been living alone independently with family checking in on regularly. She apparently did not answer phone calls since Christmas so her brother called law enforcement on 05/20/2018 for a wellness check. They found her on the couch, non-verbal, following only basic commands. There was half a can of beer without other alcohol noted. She was non-verbal and not following commands in the ER.  Bloodwork showed sodium of 155, calcium 12, WBC 10.8, lactic acid 5.33, negative UA, EtOH level <10, ammonia 20, CD 53, BUN 58, creatinine 1.68. Head CT no acute changes. B1 level was low 28.3. 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 She was discharged to rehab mid-January then moved to [facility] because she still has not returned to baseline. Her brother provided additional information that she had been sober for many years but family feels she has recently started drinking beer again within the past 6 months. Her brother found a case of beer in the pantry and found 4-6 empty cans in her living room. Prior to hospitalization, she was cognitively pretty good but not totally normal. She has a Oceanographer in Nutritional therapist. As far as they knew, she had been up to date with bill payments, very active walking 3 miles daily, driving, doing all her yardwork. She has never been very social. Now she  does not know what the year or day of the week is, and confabulates quite a bit. He feels she may have had mild confabulation prior. She is aware that she cannot come up with the right words.  He notes she has some nystagmus and tremor that  is new, and difficulty with gait and balance. He has not noticed any personality changes, she is very pleasant.   During her visit, she appears to answer questions appropriately but has word-finding difficulties and slow to respond, having to think about her answers. She reports occasional headaches mostly behind her left eye, sometimes affecting her vision. She reports vertical diplopia. She has dizziness when standing. She has numbness over her left wrist. She denies any bowel/bladder incontinence, but family reports she wears Depends. There have been 2 instances where they came to [facility] to find her caked with feces, last week she was on the floor covered with feces (but with a clean diaper). She was previously on Ativan TID, this was discontinued 3/6 but she has been receiving prn Ativan once a day for agitation and restlessness, wanting to get up. Family has not seen the agitation that SNF describes. They report she is not sleeping and wanting to get up. She is very unsteady, PT has recommended wheelchair only, she would fall back when standing. Family denies any hallucinations. He reports an 11-lb weight loss at the SNF because she was refusing food, after family intervened, this has improved. Strength is better than 6 weeks ago.   PAST MEDICAL HISTORY: Past Medical History:  Diagnosis Date  . Hypertension     MEDICATIONS: Current Outpatient Medications on File Prior to Visit  Medication Sig Dispense Refill  . acetaminophen (TYLENOL) 325 MG tablet Take 650 mg by mouth every 6 (six) hours as needed for mild pain.    Marland Kitchen amLODipine (NORVASC) 5 MG tablet Take 1 tablet (5 mg total) by mouth daily. 30 tablet 0  . bisacodyl (DULCOLAX) 10 MG suppository Place 1 suppository (10 mg total) rectally as needed for moderate constipation. (Patient not taking: Reported on 02/11/2019) 12 suppository 0  . diazepam (VALIUM) 5 MG tablet Take 1 tablet 30 minutes prior to MRI, may take second dose if needed  (Patient not taking: Reported on 02/11/2019) 2 tablet 0  . diphenhydrAMINE-zinc acetate (BENADRYL) cream Apply 1 application topically daily as needed for itching (right palm).    Marland Kitchen donepezil (ARICEPT) 10 MG tablet Take 1/2 tablet daily for 2 weeks, then increase to 1 tablet daily and continue (Patient taking differently: Take 10 mg by mouth at bedtime. ) 30 tablet 11  . Ensure (ENSURE) Take 237 mLs by mouth 3 (three) times daily between meals.    . folic acid (FOLVITE) 1 MG tablet Take 1 tablet (1 mg total) by mouth daily. (Patient not taking: Reported on 02/11/2019) 30 tablet 0  . loratadine (CLARITIN) 10 MG tablet Take 10 mg by mouth daily as needed for allergies.    . Multiple Vitamins-Minerals (MULTIVITAMIN PO) Take 1 tablet by mouth daily.    . ondansetron (ZOFRAN) 4 MG tablet Take 4 mg by mouth every 6 (six) hours as needed for nausea or vomiting.    . polyethylene glycol (MIRALAX / GLYCOLAX) packet Take 17 g by mouth daily. 14 each 0  . QUEtiapine (SEROQUEL) 25 MG tablet Take 1 tablet (25 mg total) by mouth as needed (May give extra 25 mg tablet once daily as needed for agitation.). 30 tablet 0  . QUEtiapine (SEROQUEL) 25  MG tablet Take 1 tablet (25 mg total) by mouth at bedtime. 90 tablet 0  . senna-docusate (SENOKOT-S) 8.6-50 MG tablet Take 1 tablet by mouth daily. (Patient taking differently: Take 2 tablets by mouth daily. ) 30 tablet 0  . sertraline (ZOLOFT) 25 MG tablet Take 1 tablet (25 mg total) by mouth daily. (Patient not taking: Reported on 02/11/2019) 90 tablet 3  . sertraline (ZOLOFT) 50 MG tablet Take 50 mg by mouth daily.    Marland Kitchen thiamine 100 MG tablet Take 1 tablet (100 mg total) by mouth daily. (Patient not taking: Reported on 02/11/2019) 30 tablet 0   No current facility-administered medications on file prior to visit.    ALLERGIES: Allergies  Allergen Reactions  . Oysters [employer] Allergy] Other (See Comments)    On MAR    FAMILY HISTORY: Family History  Problem  Relation Age of Onset  . CAD Brother 48       has coronary stent     SOCIAL HISTORY: Social History   Socioeconomic History  . Marital status: Married    Spouse name: Not on file  . Number of children: Not on file  . Years of education: Not on file  . Highest education level: Not on file  Occupational History  . Not on file  Tobacco Use  . Smoking status: Never Smoker  . Smokeless tobacco: Never Used  Substance and Sexual Activity  . 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 Drug use: Never  . Sexual activity: Not on file  Other Topics Concern  . 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 Difficulty of Paying Living Expenses: Not on file  Food Insecurity:   . Worried About Charity fundraiser in the Last Year: Not on file  . Ran Out of Food in the Last Year: Not on file  Transportation Needs:   . Lack of Transportation (Medical): Not on file  . Lack of Transportation (Non-Medical): Not on file  Physical Activity:   . Days of Exercise per Week: Not on file  . Minutes of Exercise per Session: Not on file  Stress:   . Feeling of Stress : Not on file  Social Connections:   . Frequency of Communication with Friends and Family: Not on file  . Frequency of Social Gatherings with Friends and Family: Not on file  . Attends Religious Services: Not on file  . Active Member of Clubs or Organizations: Not on file  . Attends Archivist Meetings: Not on file  . Marital Status: Not on file  Intimate Partner Violence:   . Fear of Current or Ex-Partner: Not on file  . Emotionally Abused: Not on file  . Physically Abused: Not on file  . Sexually Abused: Not on file    REVIEW OF SYSTEMS: Constitutional: No fevers, chills, or sweats, no  generalized fatigue, change in appetite Eyes: No visual changes, double vision, eye pain Ear, nose and throat: No hearing loss, ear pain, nasal congestion, sore throat Cardiovascular: No chest pain, palpitations Respiratory:  No shortness of breath at rest or with exertion, wheezes GastrointestinaI: No nausea, vomiting, diarrhea, abdominal pain, fecal incontinence Genitourinary:  No dysuria,  urinary retention or frequency Musculoskeletal:  No neck pain, back pain Integumentary: No rash, pruritus, skin lesions Neurological: as above Psychiatric: No depression, insomnia, anxiety Endocrine: No palpitations, fatigue, diaphoresis, mood swings, change in appetite, change in weight, increased thirst Hematologic/Lymphatic:  No anemia, purpura, petechiae. Allergic/Immunologic: no itchy/runny eyes, nasal congestion, recent allergic reactions, rashes  PHYSICAL EXAM: Vitals:   07/21/19 1155  BP: (!) 162/84  Pulse: 80  SpO2: 96%   General: No acute distress Head:  Normocephalic/atraumatic Skin/Extremities: No rash, no edema Neurological Exam: alert and oriented to person, state. No aphasia. She is dysarthric with difficulties with L and K sounds. Fund of knowledge is reduced, patient confabulating. Recent and remote memory are impaired. Attention and concentration are reduced. SLUMS score 7/30.  [employer] Mental Exam 07/21/2019  Weekday Correct 0  Current year 0  What state are we in? 1  Amount spent 0  Amount left 0  # of Animals 2  5 objects recall 0  Number series 0  Hour markers 2  Time correct 0  Placed X in triangle correctly 1  Largest Figure 1  Name of female 0  Date back to work 0  Type of work 0  State she lived in 0  Total score 7   Cranial nerves: Pupils equal, round, reactive to light.  Extraocular movements intact with downgaze nystagmus on all directions of gaze. Visual fields full. No facial asymmetry. Tongue, uvula, palate midline.  Motor: Bulk and tone  normal, muscle strength 5/5 throughout with no pronator drift. Deep tendon reflexes +2 throughout, toes downgoing.  Finger to nose testing intact.  Gait: ataxic, unable to stand independently without assist   IMPRESSION: This is a pleasant 74 yo RH woman with a history of  history of hypertension, remote alcohol abuse who had resumed alcohol intake, who had acute mental status changes in January 2020 with nystagmus and gait ataxia. Thiamine level low on admission, constellation of symptoms strongly suggestive of Wernicke encephalopathy. MRI brain 08/2018 no acute changes, with mild diffuse atrophy and minimal chronic microvascular disease. Repeat thiamine level normal. SLUMS score today 7/30. She is also dysarthric, which her brother feels started with higher Seroquel dose. We discussed reducing Seroquel alternating dose 25mg  M-W-F, 12.5mg  T-Th-Sat-Sun. If no change in symptoms, would repeat MRI brain. We also discussed speech therapy for dementia, they would like to hold off for now and will let us know when ready. Continue Donepezil 10mg  daily, her brother will contact our office in a month with plans to start Memantine. Continue 24/7 care, follow-up in 6 months.   Thank you for allowing me to participate in her care.  Please do not hesitate to call for any questions or concerns.   Ellouise Newer, M.D.   CC: Dr. Orland Mustard

## 2019-07-21 NOTE — Patient Instructions (Signed)
1. Change Seroquel dosing to Seroquel 25mg  qhs on M-W-F, Seroquel 12.5mg  qhs on T-Th-Sat-Sun  2. Let us know when you would like to proceed with ordering Speech Therapy  3. Contact our office in a month with plans to start Namenda  4. Follow-up in 6 months, call for any changes

## 2019-08-01 ENCOUNTER — Emergency Department (HOSPITAL_COMMUNITY)
Admission: EM | Admit: 2019-08-01 | Discharge: 2019-08-04 | Disposition: A | Discharge status: Skilled Nursing Facility | Payer: Medicare Other | Attending: Emergency Medicine | Admitting: Emergency Medicine

## 2019-08-01 ENCOUNTER — Encounter (HOSPITAL_COMMUNITY): Payer: Self-pay | Admitting: Emergency Medicine

## 2019-08-01 ENCOUNTER — Emergency Department (HOSPITAL_COMMUNITY): Payer: Medicare Other

## 2019-08-01 ENCOUNTER — Other Ambulatory Visit: Payer: Self-pay

## 2019-08-01 DIAGNOSIS — Z0489 Encounter for examination and observation for other specified reasons: Secondary | ICD-10-CM | POA: Diagnosis present

## 2019-08-01 DIAGNOSIS — U071 COVID-19: Secondary | ICD-10-CM | POA: Insufficient documentation

## 2019-08-01 DIAGNOSIS — R4182 Altered mental status, unspecified: Secondary | ICD-10-CM | POA: Diagnosis not present

## 2019-08-01 DIAGNOSIS — I10 Essential (primary) hypertension: Secondary | ICD-10-CM | POA: Diagnosis not present

## 2019-08-01 DIAGNOSIS — F23 Brief psychotic disorder: Secondary | ICD-10-CM | POA: Insufficient documentation

## 2019-08-01 DIAGNOSIS — F0281 Dementia in other diseases classified elsewhere with behavioral disturbance: Secondary | ICD-10-CM | POA: Diagnosis not present

## 2019-08-01 DIAGNOSIS — F060 Psychotic disorder with hallucinations due to known physiological condition: Secondary | ICD-10-CM

## 2019-08-01 DIAGNOSIS — G9341 Metabolic encephalopathy: Secondary | ICD-10-CM | POA: Diagnosis not present

## 2019-08-01 DIAGNOSIS — Z79899 Other long term (current) drug therapy: Secondary | ICD-10-CM | POA: Insufficient documentation

## 2019-08-01 DIAGNOSIS — N3000 Acute cystitis without hematuria: Secondary | ICD-10-CM | POA: Insufficient documentation

## 2019-08-01 LAB — SARS CORONAVIRUS 2 (TAT 6-24 HRS): SARS Coronavirus 2: POSITIVE — AB

## 2019-08-01 LAB — URINALYSIS, ROUTINE W REFLEX MICROSCOPIC
Bacteria, UA: NONE SEEN
Bilirubin Urine: NEGATIVE
Glucose, UA: NEGATIVE mg/dL
Hgb urine dipstick: NEGATIVE
Ketones, ur: NEGATIVE mg/dL
Nitrite: NEGATIVE
Protein, ur: 30 mg/dL — AB
Specific Gravity, Urine: 1.024 (ref 1.005–1.030)
WBC, UA: >50 WBC/hpf — ABNORMAL HIGH (ref 0–5)
pH: 6 (ref 5.0–8.0)

## 2019-08-01 LAB — COMPREHENSIVE METABOLIC PANEL
ALT: 22 U/L (ref 0–44)
AST: 26 U/L (ref 15–41)
Albumin: 4.7 g/dL (ref 3.5–5.0)
Alkaline Phosphatase: 131 U/L — ABNORMAL HIGH (ref 38–126)
Anion gap: 11 (ref 5–15)
BUN: 25 mg/dL — ABNORMAL HIGH (ref 8–23)
CO2: 24 mmol/L (ref 22–32)
Calcium: 10.5 mg/dL — ABNORMAL HIGH (ref 8.9–10.3)
Chloride: 105 mmol/L (ref 98–111)
Creatinine, Ser: 0.92 mg/dL (ref 0.44–1.00)
GFR calc Af Amer: >60 mL/min (ref >60)
GFR calc non Af Amer: >60 mL/min (ref >60)
Glucose, Bld: 145 mg/dL — ABNORMAL HIGH (ref 70–99)
Potassium: 3.6 mmol/L (ref 3.5–5.1)
Sodium: 140 mmol/L (ref 135–145)
Total Bilirubin: 0.6 mg/dL (ref 0.3–1.2)
Total Protein: 8.3 g/dL — ABNORMAL HIGH (ref 6.5–8.1)

## 2019-08-01 LAB — CBC
HCT: 42.5 % (ref 36.0–46.0)
Hemoglobin: 13.8 g/dL (ref 12.0–15.0)
MCH: 29.6 pg (ref 26.0–34.0)
MCHC: 32.5 g/dL (ref 30.0–36.0)
MCV: 91 fL (ref 80.0–100.0)
Platelets: 318 10*3/uL (ref 150–400)
RBC: 4.67 MIL/uL (ref 3.87–5.11)
RDW: 13.7 % (ref 11.5–15.5)
WBC: 10.2 10*3/uL (ref 4.0–10.5)
nRBC: 0 % (ref 0.0–0.2)

## 2019-08-01 LAB — AMMONIA: Ammonia: 14 umol/L (ref 9–35)

## 2019-08-01 LAB — ETHANOL: Alcohol, Ethyl (B): <10 mg/dL (ref <10)

## 2019-08-01 LAB — VITAMIN B12: Vitamin B-12: 1021 pg/mL — ABNORMAL HIGH (ref 180–914)

## 2019-08-01 LAB — CBG MONITORING, ED: Glucose-Capillary: 143 mg/dL — ABNORMAL HIGH (ref 70–99)

## 2019-08-01 MED ORDER — GADOBUTROL 1 MMOL/ML IV SOLN
6.0000 mL | Freq: Once | INTRAVENOUS | Status: AC | PRN
  Administered 2019-08-01: 19:00:00 6 mL via INTRAVENOUS

## 2019-08-01 MED ORDER — LEVOTHYROXINE SODIUM 50 MCG PO TABS
50.0000 ug | ORAL_TABLET | Freq: Every day | ORAL | Status: DC
  Administered 2019-08-02: 50 ug via ORAL
  Filled 2019-08-01: qty 1

## 2019-08-01 MED ORDER — BUSPIRONE HCL 10 MG PO TABS
5.0000 mg | ORAL_TABLET | Freq: Two times a day (BID) | ORAL | Status: DC
  Administered 2019-08-02: 09:00:00 5 mg via ORAL
  Filled 2019-08-01: qty 1

## 2019-08-01 MED ORDER — TRAZODONE HCL 50 MG PO TABS: 150.0000 mg | ORAL_TABLET | Freq: Every evening | ORAL | Status: DC | PRN

## 2019-08-01 MED ORDER — SODIUM CHLORIDE 0.9% FLUSH: 3.0000 mL | Freq: Once | INTRAVENOUS | Status: DC

## 2019-08-01 MED ORDER — MELATONIN 5 MG PO TABS
5.0000 mg | ORAL_TABLET | Freq: Every day | ORAL | Status: DC
  Administered 2019-08-02 – 2019-08-03 (×2): 5 mg via ORAL
  Filled 2019-08-01 (×2): qty 1

## 2019-08-01 MED ORDER — QUETIAPINE FUMARATE 25 MG PO TABS
25.0000 mg | ORAL_TABLET | Freq: Every day | ORAL | Status: DC
  Administered 2019-08-01 – 2019-08-03 (×3): 25 mg via ORAL
  Filled 2019-08-01 (×3): qty 1

## 2019-08-01 NOTE — BH Assessment (Signed)
Tele Assessment Note   Patient Name: Sarah Gutierrez MRN: [id number] Referring Physician: Rayna Sexton, PA-C Location of Patient: [name], [phone] Location of Provider: [place] is an 74 y.o. female who presents unaccompanied to [facility] via EMS due to altered mental status. Pt is a resident at [facility]. Pt's brother called EMS due to worsening of Pt's mental status and apparent hallucinations or delusional thoughts. Per Pt's neurologist, Pt has constellation of symptoms strongly suggestive of Wernicke encephalopathy. Due to Pt's mental status, she was unable to appropriately answer questions during assessment. She is not oriented and thought process appears tangential and organized. 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  TTS contacted Pt's brother, Dr. Harvel Quale [phone], for collateral information. He says Pt had acute episode of altered mental status one year ago and has never fully recovered. He states two months ago she became more agitated and was on the floor crying and banging her head. He says she was started on Seroquel by her neurologist, Dr. Ellouise Newer, which was gradually increased to 25 mg daily. Pt appeared to be "giddy" on this dosage and Pt's dosage was decreased to 25 mg on Monday, Wednesday, Friday and 12.5 mg on other days. Now Pt appears to be more agitated and appears delusional, talking about "babies being in trouble." Dr. Maurie Boettcher does not believe Pt needs inpatient psychiatric treatment but that her medication needs to be adjusted.   Pt is dressed in hospital gown, alert and not oriented to place, date or situation. Pt speaks in a clear tone, at moderate volume and normal pace. Motor behavior appears normal. Eye contact is good. Pt's mood is euthymic and affect is congruent with mood. Thought process is disorganized.  Note from neurologist Ellouise Newer, MD 07/21/19:   HISTORY OF PRESENT ILLNESS: I had the pleasure of seeing Demetrious Olszowy in follow-up in the neurology clinic on 07/21/2019.  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 She was admitted for acute mental status change last January 2020 with prolonged encephalopathy with with nystagmus and gait ataxia. 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 MMSE 13/30 in March 2020. She was started on Sertraline 50mg  daily. I personally reviewed MRI brain with and without contrast done 08/2018 which did not show any acute changes. There was mild diffuse atrophy and minimal chronic microvascular disease. Repeat thiamine level in 11/2018 was 193 (ref 70-180). She was started on Donepezil 10mg  daily in June 2020, no side effects. In September, her brother contacted our office to report several episodes of acute anxiety where she cries, gets on the floor, yelling at staff, inconsolable. She calmed down after 90 minutes. She was started on Seroquel which did help. She is on Seroquel 25mg  qhs, her brother reports that since dose increase, she has had more slurred speech and some giddiness. No paranoia or hallucinations.    Since her last visit, Dr. Orland Mustard reports that her memory seems a little better recently. She continues to confabulate, saying she helps others in the SNF. She has been isolated in her room during the peak of the pandemic, the dining hall was only opened last week. She is able to feed herself. She needs assistance with dressing. She is unable to ambulate independently, PT comes weekly, she needs assist with her walker. She  denies any headaches, dizziness, diplopia, focal numbness/tinglng/weakness. Sleep is good.    History on Initial Assessment 07/31/2018: This is a pleasant 74 year old right-handed woman with a history of hypertension, remote alcohol abuse, presenting for evaluation of continued  mental status changes. Records from her hospitalization and report from her brother were reviewed, this appears to be quite an acute change for her. She had been living alone independently with family checking in on regularly. She apparently did not answer phone calls since Christmas so her brother called law enforcement on 05/20/2018 for a wellness check. They found her on the couch, non-verbal, following only basic commands. There was half a can of beer without other alcohol noted. She was non-verbal and not following commands in the ER.  Bloodwork showed sodium of 155, calcium 12, WBC 10.8, lactic acid 5.33, negative UA, EtOH level <10, ammonia 20, CD 53, BUN 58, creatinine 1.68. Head CT no acute changes. B1 level was low 28.3. 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 She was discharged to rehab mid-January then moved to [facility] SNF because she still has not returned to baseline. Her brother provided additional information that she had been sober for many years but family feels she has recently started drinking beer again within the past 6 months. Her brother found a case of beer in the pantry and found 4-6 empty cans in her living room. Prior to hospitalization, she was cognitively pretty good but not totally normal. She has a Oceanographer in Nutritional therapist. As far as they knew, she had been up to date with bill payments, very active walking 3 miles daily, driving, doing all her yardwork. She has never been very social. Now she does not know what the year or day of the week is, and confabulates quite a bit. He feels she may have had mild confabulation prior. She is aware that she cannot come up with the right words.  He notes she has some nystagmus and tremor that is new, and difficulty with gait and balance. He has  not noticed any personality changes, she is very pleasant.    During her visit, she appears to answer questions appropriately but has word-finding difficulties and slow to respond, having to think about her answers. She reports occasional headaches mostly behind her left eye, sometimes affecting her vision. She reports vertical diplopia. She has dizziness when standing. She has numbness over her left wrist. She denies any bowel/bladder incontinence, but family reports she wears Depends. There have been 2 instances where they came to [facility] to find her caked with feces, last week she was on the floor covered with feces (but with a clean diaper). She was previously on Ativan TID, this was discontinued 3/6 but she has been receiving prn Ativan once a day for agitation and restlessness, wanting to get up. Family has not seen the agitation that SNF describes. They report she is not sleeping and wanting to get up. She is very unsteady, PT has recommended wheelchair only, she would fall back when standing. Family denies any hallucinations. He reports an 11-lb weight loss at the SNF because she was refusing food, after family intervened, this has improved. Strength is better than 6 weeks ago.    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 Hypertension     History reviewed. No pertinent surgical history.  Family History:  Family History  Problem Relation Age of Onset  . CAD Brother 83       has coronary stent     Social History:  reports that she has never smoked. She has never used smokeless tobacco. She reports current alcohol use. She reports that she does not use drugs.  Additional Social History:  Alcohol / Drug Use Pain Medications: No abuse Prescriptions: No abuse Over the Counter: No abuse History of alcohol / drug use?: Yes(Pt has history of alcohol  abuse) Longest period of sobriety (when/how long): unknown  CIWA: CIWA-Ar BP: (!) 169/73 Pulse Rate: 93 COWS:    Allergies:  Allergies  Allergen Reactions  . Codeine   . Doxycycline   . Griseofulvin Ultramicrosize [Griseofulvin]   . Halcion [Triazolam]   . Oysters [Shellfish Allergy] Other (See Comments)    On MAR  . Penicillins     Home Medications: (Not in a hospital admission)   OB/GYN Status:  No LMP recorded. Patient is postmenopausal.  General Assessment Data Location of Assessment: [facility] TTS Assessment: In system Is this a Tele or Face-to-Face Assessment?: Tele Assessment Is this an Initial Assessment or a Re-assessment for this encounter?: Initial Assessment Patient Accompanied by:: N/A Language Other than English: No Living Arrangements: In Assisted Living/Nursing Home (Comment: Name of Nursing Home([facility]) What gender do you identify as?: Female Marital status: Divorced [place] name: NA Pregnancy Status: No Living Arrangements: Other (Comment)(Assisted living) Can pt return to current living arrangement?: Yes Admission Status: Voluntary Is patient capable of signing voluntary admission?: No Referral Source: Other(Assisted living) Insurance type: Medicare     Crisis Care Plan Living Arrangements: Other (Comment)(Assisted living) Name of Psychiatrist: None Name of Therapist: None  Education Status Is patient currently in school?: No Is the patient employed, unemployed or receiving disability?: Unemployed  Risk to self with the past 6 months Suicidal Ideation: No Has patient been a risk to self within the past 6 months prior to admission? : No Suicidal Intent: No Has patient had any suicidal intent within the past 6 months prior to admission? : No Is patient at risk for suicide?: No Suicidal Plan?: No Has patient had any suicidal plan within the past 6 months prior to admission? : No Access to Means: No What has been your use of  drugs/alcohol within the last 12 months?: Pt has past history of alcohol use Previous Attempts/Gestures: No How many times?: 0 Other Self Harm Risks: None Triggers for Past Attempts: None known Family Suicide History: No Recent stressful life event(s): Other (Comment)(None identified) Persecutory voices/beliefs?: No Depression: No Depression Symptoms: Feeling angry/irritable, Fatigue Substance abuse history and/or treatment for substance abuse?: No Suicide prevention information given to non-admitted patients: Not applicable  Risk to Others within the past 6 months Homicidal Ideation: No Does patient have any lifetime risk of violence toward others beyond the six months prior to admission? : No Thoughts of Harm to Others: No Current Homicidal Intent: No Current Homicidal Plan: No Access to Homicidal Means: No Identified Victim: None History of harm to others?: No Assessment of Violence: None Noted Violent Behavior Description: None Does patient have access to weapons?: No Criminal Charges Pending?: No Does patient have a court date: No Is patient on probation?: No  Psychosis Hallucinations: None noted Delusions: Unspecified(reported to staff that "babies are in trouble")  Mental Status Report Appearance/Hygiene: In hospital gown Eye  Contact: Good Motor Activity: Freedom of movement Speech: Tangential Level of Consciousness: Alert Mood: Pleasant Affect: Appropriate to circumstance Anxiety Level: None Thought Processes: Irrelevant, Flight of Ideas Orientation: Not oriented Obsessive Compulsive Thoughts/Behaviors: None  Cognitive Functioning Concentration: Poor Memory: Recent Impaired, Remote Impaired Is patient IDD: No Insight: Poor Impulse Control: Poor Appetite: Poor Have you had any weight changes? : Loss Amount of the weight change? (lbs): (Unknown) Sleep: No Change Total Hours of Sleep: (unknown) Vegetative Symptoms: Decreased grooming  ADLScreening [facility]  Assessment Services) Patient's cognitive ability adequate to safely complete daily activities?: No Patient able to express need for assistance with ADLs?: Yes Independently performs ADLs?: No  Prior Inpatient Therapy Prior Inpatient Therapy: No  Prior Outpatient Therapy Prior Outpatient Therapy: No Does patient have an ACCT team?: No Does patient have Intensive In-House Services?  : No Does patient have [facility] services? : No Does patient have P4CC services?: No  ADL Screening (condition at time of admission) Patient's cognitive ability adequate to safely complete daily activities?: No Is the patient deaf or have difficulty hearing?: No Does the patient have difficulty seeing, even when wearing glasses/contacts?: No Does the patient have difficulty concentrating, remembering, or making decisions?: Yes Patient able to express need for assistance with ADLs?: Yes Does the patient have difficulty dressing or bathing?: Yes Independently performs ADLs?: No Communication: Independent Dressing (OT): Needs assistance Is this a change from baseline?: Pre-admission baseline Grooming: Needs assistance Is this a change from baseline?: Pre-admission baseline Feeding: Independent Bathing: Needs assistance Is this a change from baseline?: Pre-admission baseline Toileting: Needs assistance Is this a change from baseline?: Pre-admission baseline In/Out Bed: Independent Walks in Home: Independent with device (comment) Does the patient have difficulty walking or climbing stairs?: Yes Weakness of Legs: Both Weakness of Arms/Hands: None  Home Assistive Devices/Equipment Home Assistive Devices/Equipment: Wheelchair    Abuse/Neglect Assessment (Assessment to be complete while patient is alone) Abuse/Neglect Assessment Can Be Completed: Unable to assess, patient is non-responsive or altered mental status     Advance Directives (For Healthcare) Does Patient Have a Medical Advance Directive?:  Unable to assess, patient is non-responsive or altered mental status          Disposition: Gave clinical report to Lindon Romp, FNP recommends Pt be observed overnight and evaluated by psychiatry in the morning. Notified Dr. Isla Pence and TCU staff of recommendation. TTS notified Pt's brother, Dr Harvel Quale, of recommendation and he said he would be traveling from [place], [place] in the morning to see Pt.   Disposition Initial Assessment Completed for this Encounter: Yes  This service was provided via telemedicine using a 2-way, interactive audio and video technology.  Names of all persons participating in this telemedicine service and their role in this encounter. Name: Madaline Brilliant Role: Patient  Name: Dr. Harvel Quale Role: Pt's brother  Name: Storm Frisk, [facility] Role: TTS counselor      Orpah Greek Anson Fret, [facility], [facility] Triage Specialist [phone]  Evelena Peat 08/01/2019 10:47 PM

## 2019-08-01 NOTE — Progress Notes (Signed)
Received Sarah Gutierrez in her room after arrival back from a test. She is disorganized and when she was assisted to the bathroom with the walker she was having difficulty walking and dragging her right leg. A urine specimen was sent to the lab. A purwick was applied, but with her confusion and restlessness  it was displaced. She slept throughout the night.

## 2019-08-01 NOTE — ED Notes (Signed)
Lab indicate that they can add the Vit B1 and Bit B12 onto blood already collected.

## 2019-08-01 NOTE — ED Notes (Signed)
Whenever this writer approaches pt she becomes so tearful and upset that it is not possible to draw labs. Report this to [facility].

## 2019-08-01 NOTE — ED Triage Notes (Signed)
Pt's brother called EMS.  Her brother is a Tax adviser in [place].  Pt resides at [facility] in [place]. Brother noticed that she is hallucinating, not making sense, garbled words, and he asked that she be sent out. He called 911 and [employer] arrived and because she was no harm to others or self they requested EMS to transport pt. 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 She has a history of this on and off. She is on Seroquel and Trazodone with early dementia. According to her brother she has not been like this for a while.

## 2019-08-01 NOTE — ED Provider Notes (Signed)
[facility] DEPT Provider Note   CSN: [id number] Arrival date & time: 08/01/19  1520     History Chief Complaint  Patient presents with  . Medical Clearance   LEVEL FIVE CAVEAT DUE TO AMS  Sarah Gutierrez is a 74 y.o. female.  HPI HPI Comments: Sarah Gutierrez is a 74 y.o. female history of hypertension, acute metabolic encephalopathy who presents to the Emergency Department via EMS due to altered mental status.  Per nursing home, patient lives in assisted living in [place] and her brother noticed that she started hallucinating today.  He called 911 and had her sent to the emergency department.  She is currently confused and has difficulty answering questions.  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  She states she is tired but does not have any other complaints at this time.  Per records, pt was recent seen by neurology on 07/21/2019. 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 Her SLUMS score at this appointment was 7/30. She was also dysarthric at this visit and her brother is concerned that she was taking too much seroquel which was 25 mg qhs. Her dose was changed to Seroquel 25mg  qhs on M-W-F, Seroquel 12.5mg  qhs on T-Th-Sat-Sun.   I have since had a conversation with her brother who is a Sport and exercise psychologist.  He confirms everything and her most recent neurology appointment.  He felt that when she was on 25 mg of Seroquel that she was "giddy" but at the same time had not been experiencing any anxiety or sleep issues.  The neurologist cut back her dose of Seroquel as mentioned earlier, and she began to experience hallucinations, anxiety, sleep issues which were all problems she was having before starting this medication.      Past Medical History:  Diagnosis Date  . Hypertension     Patient Active Problem List   Diagnosis Date Noted  .  Protein-calorie malnutrition, severe 05/29/2018  . Pressure injury of skin 05/29/2018  . AKI (acute kidney injury) ([place])   . Acute metabolic encephalopathy [id number]  . Dehydration 05/20/2018  . Hypercalcemia 05/20/2018  . Hypernatremia 05/20/2018  . Lactic acid acidosis 05/20/2018  . Elevated troponin 05/20/2018  . Hypertension   . Closed displaced fracture of middle phalanx of left ring finger 12/01/2015    History reviewed. No pertinent surgical history.   OB History   No obstetric history on file.     Family History  Problem Relation Age of Onset  . CAD Brother 66       has coronary stent     Social History   Tobacco Use  . Smoking status: Never Smoker  . Smokeless tobacco: Never Used  Substance Use Topics  . 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 Drug use: Never    Home Medications Prior to Admission medications   Medication Sig Start Date End Date Taking? Authorizing Provider  acetaminophen (TYLENOL) 325 MG tablet Take 650 mg by mouth every 6 (six) hours as needed for mild pain.    [provider]  amLODipine (NORVASC) 5 MG tablet Take 1 tablet (5 mg total) by mouth daily. 05/30/18   Dessa Phi, DO  bisacodyl (DULCOLAX) 10 MG suppository Place 1 suppository (10 mg total) rectally as needed for moderate constipation. Patient not taking: Reported on 02/11/2019 07/12/18   Hayden Rasmussen,  MD  diphenhydrAMINE-zinc acetate (BENADRYL) cream Apply 1 application topically daily as needed for itching (right palm).    [provider]  donepezil (ARICEPT) 10 MG tablet Take 1/2 tablet daily for 2 weeks, then increase to 1 tablet daily and continue Patient taking differently: Take 10 mg by mouth at bedtime.  10/23/18   Cameron Sprang, MD  Ensure (ENSURE) Take 237 mLs by mouth 3 (three) times daily between meals.    [provider]  folic acid (FOLVITE) 1 MG tablet Take 1 tablet (1 mg total) by mouth daily. Patient not  taking: Reported on 02/11/2019 05/30/18   Dessa Phi, DO  loratadine (CLARITIN) 10 MG tablet Take 10 mg by mouth daily as needed for allergies.    [provider]  Multiple Vitamins-Minerals (MULTIVITAMIN PO) Take 1 tablet by mouth daily.    [provider]  ondansetron (ZOFRAN) 4 MG tablet Take 4 mg by mouth every 6 (six) hours as needed for nausea or vomiting.    [provider]  polyethylene glycol (MIRALAX / GLYCOLAX) packet Take 17 g by mouth daily. 06/01/18   Dessa Phi, DO  QUEtiapine (SEROQUEL) 25 MG tablet Take 1 tablet (25 mg total) by mouth as needed (May give extra 25 mg tablet once daily as needed for agitation.). Patient not taking: Reported on 07/21/2019 02/14/19   Charlesetta Shanks, MD  QUEtiapine (SEROQUEL) 25 MG tablet Take 1 tablet (25 mg total) by mouth at bedtime. 02/14/19   Charlesetta Shanks, MD  senna-docusate (SENOKOT-S) 8.6-50 MG tablet Take 1 tablet by mouth daily. Patient taking differently: Take 2 tablets by mouth daily.  06/01/18   Dessa Phi, DO  sertraline (ZOLOFT) 50 MG tablet Take 50 mg by mouth daily.    [provider]   Allergies    Jeanie Cooks allergy]  Review of Systems   Review of Systems  Unable to perform ROS: Mental status change   Physical Exam Updated Vital Signs SpO2 98%   Physical Exam Vitals and nursing note reviewed.  Constitutional:      General: She is in acute distress.     Appearance: Normal appearance. She is normal weight. She is not ill-appearing, toxic-appearing or diaphoretic.  HENT:     Head: Normocephalic and atraumatic.     Right Ear: External ear normal.     Left Ear: External ear normal.     Nose: Nose normal.  Eyes:     General: No scleral icterus.       Right eye: No discharge.        Left eye: No discharge.  Cardiovascular:     Rate and Rhythm: Regular rhythm. Tachycardia present.     Pulses: Normal pulses.     Heart sounds: Murmur present.  Pulmonary:     Effort:  Pulmonary effort is normal. No respiratory distress.     Breath sounds: Normal breath sounds. No stridor. No wheezing, rhonchi or rales.  Abdominal:     General: Abdomen is flat.     Tenderness: There is no abdominal tenderness.  Musculoskeletal:     Cervical back: Normal range of motion.  Neurological:     Mental Status: She is alert. She is disoriented.     Motor: No weakness.     Coordination: Coordination abnormal.     Comments: Mild ataxia noted with finger to nose bilaterally. Pt able to follow commands though she is only oriented to self. She does not clearly answer questions.   Psychiatric:  Mood and Affect: Mood is anxious. Affect is tearful.        Speech: Speech is rapid and pressured and slurred.        Thought Content: Thought content is delusional.    ED Results / Procedures / Treatments   Labs (all labs ordered are listed, but only abnormal results are displayed) Labs Reviewed  SARS CORONAVIRUS 2 (TAT 6-24 HRS) - Abnormal; Notable for the following components:      Result Value   SARS Coronavirus 2 POSITIVE (*)    All other components within normal limits  COMPREHENSIVE METABOLIC PANEL - Abnormal; Notable for the following components:   Glucose, Bld 145 (*)    BUN 25 (*)    Calcium 10.5 (*)    Total Protein 8.3 (*)    Alkaline Phosphatase 131 (*)    All other components within normal limits  URINALYSIS, ROUTINE W REFLEX MICROSCOPIC - Abnormal; Notable for the following components:   APPearance HAZY (*)    Protein, ur 30 (*)    Leukocytes,Ua LARGE (*)    WBC, UA >50 (*)    All other components within normal limits  VITAMIN B12 - Abnormal; Notable for the following components:   Vitamin B-12 1,021 (*)    All other components within normal limits  CBG MONITORING, ED - Abnormal; Notable for the following components:   Glucose-Capillary 143 (*)    All other components within normal limits  CBC  ETHANOL  AMMONIA  VITAMIN B1  CBG MONITORING, ED    EKG EKG Interpretation  Date/Time:  Friday August 01 2019 19:30:40 EDT Ventricular Rate:  78 PR Interval:  152 QRS Duration: 62 QT Interval:  366 QTC Calculation: 417 R Axis:   74 Text Interpretation: Normal sinus rhythm Nonspecific ST abnormality Abnormal ECG No significant change since last tracing Confirmed by Isla Pence [phone]) on 08/01/2019 7:35:46 PM   Radiology CT Head Wo Contrast  Result Date: 08/01/2019 CLINICAL DATA:  Encephalopathy. EXAM: CT HEAD WITHOUT CONTRAST TECHNIQUE: Contiguous axial images were obtained from the base of the skull through the vertex without intravenous contrast. COMPARISON:  02/14/2019 FINDINGS: Brain: No evidence of acute infarction, hemorrhage, hydrocephalus, extra-axial collection or mass lesion/mass effect. Atrophy and chronic microvascular ischemic changes are noted Vascular: No hyperdense vessel or unexpected calcification. Skull: Normal. Negative for fracture or focal lesion. Sinuses/Orbits: There is some mucosal thickening of the sphenoid sinuses. The remaining paranasal sinuses and mastoid air cells are essentially clear. Other: None. IMPRESSION: No acute intracranial abnormality. Electronically Signed   By: Constance Holster M.D.   On: 08/01/2019 17:16   MR Brain W and Wo Contrast  Result Date: 08/01/2019 CLINICAL DATA:  Encephalopathy EXAM: MRI HEAD WITHOUT AND WITH CONTRAST TECHNIQUE: Multiplanar, multiecho pulse sequences of the brain and surrounding structures were obtained without and with intravenous contrast. CONTRAST:  95mL GADAVIST GADOBUTROL 1 MMOL/ML IV SOLN COMPARISON:  09/05/2018 FINDINGS: Brain: There is no acute infarction or intracranial hemorrhage. There is no intracranial mass, mass effect, or edema. There is no hydrocephalus or extra-axial fluid collection. Ventricles and sulci are stable in size. Patchy foci of T2 hyperintensity in the supratentorial white matter are nonspecific but may reflect minor chronic microvascular  ischemic changes. No abnormal enhancement. Vascular: Major vessel flow voids at the skull base are preserved. Skull and upper cervical spine: Normal marrow signal is preserved. Sinuses/Orbits: Minor mucosal thickening.  Orbits are unremarkable. Other: Sella is unremarkable.  Mastoid air cells are clear. IMPRESSION: No evidence of recent  infarction, hemorrhage, or mass Electronically Signed   By: Macy Mis M.D.   On: 08/01/2019 19:16   DG Chest Portable 1 View  Result Date: 08/01/2019 CLINICAL DATA:  Patient is hallucinating, not making sense. Garbled words. EXAM: PORTABLE CHEST 1 VIEW COMPARISON:  11/18/2018 FINDINGS: Heart size is normal. Lungs are clear. No pulmonary edema. Remote bilateral rib fractures. IMPRESSION: No evidence for acute cardiopulmonary abnormality. Electronically Signed   By: Nolon Nations M.D.   On: 08/01/2019 17:02    Procedures Procedures   Medications Ordered in ED Medications  sodium chloride flush (NS) 0.9 % injection 3 mL (3 mLs Intravenous Not Given 08/01/19 1612)  QUEtiapine (SEROQUEL) tablet 25 mg (25 mg Oral Given 08/01/19 2111)  traZODone (DESYREL) tablet 150 mg (has no administration in time range)  Melatonin TABS 5 mg (has no administration in time range)  levothyroxine (SYNTHROID) tablet 50 mcg (has no administration in time range)  busPIRone (BUSPAR) tablet 5 mg (has no administration in time range)  gadobutrol (GADAVIST) 1 MMOL/ML injection 6 mL (6 mLs Intravenous Contrast Given 08/01/19 1847)    ED Course  I have reviewed the triage vital signs and the nursing notes.  Pertinent labs & imaging results that were available during my care of the patient were reviewed by me and considered in my medical decision making (see chart for details).    MDM Rules/Calculators/A&P                      4:21 PM Pt is a 74 year old caucasian female that presents due to altered mental status. She has a history of ETOH abuse, possible Wernicke's encephalopathy  and rapid cognitive decline over the past year. Her Seroquel dosing was decreased about two weeks ago. I spoke to her brother who is a physician and he states that her behavior is similar to that of before she began taking 25 mg Seroquel qhs. She is tachycardic and hypertensive at 198/77. Will also discuss this patient with her neurologist. Basic labs ordered along with CXR and CT of the head. Will reassess.   4:58 PM I spoke with Dr. Leonel Ramsay in neurology. He recommends we repeat her MRI of the brain and check a thiamine level.  He also recommended that we initiate 100 mg of thiamine daily after this visit.  Repeat blood pressure 169/73.  Will order both.   7:21 PM MRI negative for acute changes. This pt was discussed with and evaluated by my attending physician Dr. Isla Pence who recommends we discuss with TTS. Will also give her the evening dose of Seroquel. Will consult.  10:46 PM Home meds ordered and patient placed on psych hold.   Final Clinical Impression(s) / ED Diagnoses Final diagnoses:  Altered mental status, unspecified altered mental status type   Rx / DC Orders ED Discharge Orders    None       Rayna Sexton, PA-C 08/01/19 2253    Isla Pence, MD 08/01/19 [phone]

## 2019-08-01 NOTE — ED Notes (Signed)
This writer attempted to draw blood. Pt was cooperative, but this Probation officer failed in the right [facility].

## 2019-08-02 DIAGNOSIS — F060 Psychotic disorder with hallucinations due to known physiological condition: Secondary | ICD-10-CM

## 2019-08-02 DIAGNOSIS — R4182 Altered mental status, unspecified: Secondary | ICD-10-CM

## 2019-08-02 NOTE — ED Notes (Signed)
Tele psych machine at bedside 

## 2019-08-02 NOTE — ED Provider Notes (Signed)
Received message from the nurse that the patient's brother wanted to speak to me.  I discussed with him the patient's disposition and prognosis.  Also he stated that we actually entered the wrong medications for the patient based on an accurate MAR provided by her living facility.  He also asked me to recheck her Covid test as she has not had a symptoms of coronavirus.  He also request that the psychiatry team talk to him about medication management and further disposition. Will contact them for same. Pharmacy will obtain correct information and enter in chart.    Merrily Pew, MD 08/02/19 [phone]

## 2019-08-02 NOTE — ED Notes (Signed)
Pt brother visiting pt.

## 2019-08-02 NOTE — Progress Notes (Addendum)
Patient meets criteria for inpatient treatment per Merlyn Lot, NP. No appropriate beds at [facility] currently. CSW faxed referrals to the following facilities for review:  [facility]   [facility]  [facility]    TTS will continue to seek bed placement.     Darletta Moll MSW, [place] Worker Disposition  [facility] Ph: [phone] Fax: [phone] 08/02/2019 11:15 AM

## 2019-08-02 NOTE — ED Notes (Signed)
Pt provided breakfast tray, brief changed, peri care performed, Pt alert - voicng no complaints at this time. Will continue to monitor.

## 2019-08-02 NOTE — ED Notes (Addendum)
This nurse consulted with pharmacy to review/update pt PTA medication regimen per EDP request.

## 2019-08-02 NOTE — ED Notes (Signed)
Pt brief changed.  Pt is resting comfortably  No complaints at this time

## 2019-08-02 NOTE — Progress Notes (Signed)
Received Sarah Gutierrez this PM at the change of shift awake in her bed watching the TV. She is pressing the nurses call light at intervals requesting her TV to be turned on and off. The sitter is at the bedside. She believes she is in the hospital. She was compliant with her hs medications. She slept throughout the night without incident.

## 2019-08-02 NOTE — Consult Note (Signed)
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  Patient seen via telepsych by this provider; chart reviewed and consulted with Dr. Parke Poisson on 08/02/19.   On evaluation Sarah Gutierrez states, "hi" when greeted by this Probation officer.  She presents confused, disorganized and has difficulty rendering her story.  Her speech is very garbled and incoherent.   Collateral information received from nursing staff and EDP notes, her presentation was the same at the time of admission.  She is pleasant, intermittently smiles and attempts to tell her story, while pointing at her face and teeth but unable to articulate her needs.     She is covid + but no coughing or breathing concerns present during assessment.    Past Psychiatric History:   Risk to Self: Suicidal Ideation: No Suicidal Intent: No Is patient at risk for suicide?: No Suicidal Plan?: No Access to Means: No What has been your use of drugs/alcohol within the last 12 months?: Pt has past history of alcohol use How many times?: 0 Other Self Harm Risks: None Triggers for Past Attempts: None known Risk to Others: Homicidal Ideation: No Thoughts of Harm to Others: No Current Homicidal Intent: No Current Homicidal Plan: No Access to Homicidal Means: No Identified Victim: None History of harm to  others?: No Assessment of Violence: None Noted Violent Behavior Description: None Does patient have access to weapons?: No Criminal Charges Pending?: No Does patient have a court date: No Prior Inpatient Therapy: Prior Inpatient Therapy: No Prior Outpatient Therapy: Prior Outpatient Therapy: No Does patient have an ACCT team?: No Does patient have Intensive In-House Services?  : No Does patient have [facility] services? : No Does patient have P4CC services?: No  Past Medical History:  Past Medical History:  Diagnosis Date  . Hypertension    History reviewed. No pertinent surgical history. Family History:  Family History  Problem Relation Age of Onset  . CAD Brother 33       has coronary stent    Family Psychiatric  History: unknown Social History:  Social History   Substance and Sexual Activity  Alcohol Use Yes   Comment: hx alcohol abuse per family     Social History   Substance and Sexual Activity  Drug Use Never    Social History   Socioeconomic History  . Marital status: Married    Spouse name: Not on file  . Number of children: Not on file  . Years of education: Not on file  . Highest education level: Not on file  Occupational History  . Not on file  Tobacco Use  . Smoking status: Never Smoker  . Smokeless tobacco: Never Used  Substance and Sexual Activity  . 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 Drug use: Never  . Sexual activity: Not on file  Other Topics Concern  . 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 Difficulty of Paying Living Expenses:   Food Insecurity:   . Worried About Charity fundraiser in the Last Year:   . Arboriculturist in the Last Year:   Transportation Needs:   . Film/video editor  (Medical):   Marland Kitchen Lack of Transportation (Non-Medical):   Physical Activity:   . Days of Exercise per Week:   . Minutes of Exercise per Session:   Stress:   . Feeling of Stress :   Social Connections:   . Frequency of Communication with Friends and Family:   . Frequency of Social Gatherings with Friends and Family:   . Attends Religious Services:   . Active Member of Clubs or Organizations:   . Attends Archivist Meetings:   Marland Kitchen Marital Status:    Additional Social History:    Allergies:   Allergies  Allergen Reactions  . Codeine   . Doxycycline   . Griseofulvin Ultramicrosize [Griseofulvin]   . Halcion [Triazolam]   . Oysters [Shellfish Allergy] Other (See Comments)    On MAR  . Penicillins     Labs:  Results for orders placed or performed during the hospital encounter of 08/01/19 (from the past 48 hour(s))  Urinalysis, Routine w reflex microscopic     Status: Abnormal   Collection Time: 08/01/19  4:03 PM  Result Value Ref Range   Color, Urine YELLOW YELLOW   APPearance HAZY (A) CLEAR   Specific Gravity, Urine 1.024 1.005 - 1.030   pH 6.0 5.0 - 8.0   Glucose, UA NEGATIVE NEGATIVE mg/dL   Hgb urine dipstick NEGATIVE NEGATIVE   Bilirubin Urine NEGATIVE NEGATIVE   Ketones, ur NEGATIVE NEGATIVE mg/dL   Protein, ur 30 (A) NEGATIVE mg/dL   Nitrite NEGATIVE NEGATIVE   Leukocytes,Ua LARGE (A) NEGATIVE   RBC / HPF 11-20 0 - 5 RBC/hpf   WBC, UA >50 (H) 0 - 5 WBC/hpf   Bacteria, UA NONE SEEN NONE SEEN   Squamous Epithelial / LPF 0-5 0 - 5   Mucus PRESENT    Hyaline Casts, UA PRESENT     Comment: Performed at [facility], [place] [address] [place], [place] [address]  CBG monitoring, ED     Status: Abnormal   Collection Time: 08/01/19  4:16 PM  Result Value Ref Range   Glucose-Capillary 143 (H) 70 - 99 mg/dL    Comment: Glucose reference range applies only to samples taken after fasting for at least 8 hours.  Vitamin B12     Status: Abnormal    Collection Time: 08/01/19  4:47 PM  Result Value Ref Range   Vitamin B-12 1,021 (H) 180 - 914 pg/mL    Comment: (NOTE) This assay is not validated for testing neonatal or myeloproliferative syndrome specimens for Vitamin B12 levels. Performed at [facility], [place] [address] [place], [place] [address]   Comprehensive metabolic panel     Status: Abnormal   Collection Time: 08/01/19  4:48 PM  Result Value Ref Range   Sodium 140 135 - 145 mmol/L   Potassium 3.6 3.5 - 5.1 mmol/L   Chloride 105 98 - 111 mmol/L   CO2 24 22 - 32 mmol/L   Glucose, Bld  145 (H) 70 - 99 mg/dL    Comment: Glucose reference range applies only to samples taken after fasting for at least 8 hours.   BUN 25 (H) 8 - 23 mg/dL   Creatinine, Ser 0.92 0.44 - 1.00 mg/dL   Calcium 10.5 (H) 8.9 - 10.3 mg/dL   Total Protein 8.3 (H) 6.5 - 8.1 g/dL   Albumin 4.7 3.5 - 5.0 g/dL   AST 26 15 - 41 U/L   ALT 22 0 - 44 U/L   Alkaline Phosphatase 131 (H) 38 - 126 U/L   Total Bilirubin 0.6 0.3 - 1.2 mg/dL   GFR calc non Af Amer >60 >60 mL/min   GFR calc Af Amer >60 >60 mL/min   Anion gap 11 5 - 15    Comment: Performed at [facility], [place] [address] [place], [place] [address]  CBC     Status: None   Collection Time: 08/01/19  4:48 PM  Result Value Ref Range   WBC 10.2 4.0 - 10.5 K/uL   RBC 4.67 3.87 - 5.11 MIL/uL   Hemoglobin 13.8 12.0 - 15.0 g/dL   HCT 42.5 36.0 - 46.0 %   MCV 91.0 80.0 - 100.0 fL   MCH 29.6 26.0 - 34.0 pg   MCHC 32.5 30.0 - 36.0 g/dL   RDW 13.7 11.5 - 15.5 %   Platelets 318 150 - 400 K/uL   nRBC 0.0 0.0 - 0.2 %    Comment: Performed at [facility], [place] [address], [place], [place] [address]  Ethanol     Status: None   Collection Time: 08/01/19  4:48 PM  Result Value Ref Range   Alcohol, Ethyl (B) <10 <10 mg/dL    Comment: (NOTE) Lowest detectable limit for serum alcohol is 10 mg/dL. For medical purposes only. Performed at [facility], [place] [address], [place],  [address]   Ammonia     Status: None   Collection Time: 08/01/19  4:48 PM  Result Value Ref Range   Ammonia 14 9 - 35 umol/L    Comment: Performed at [facility], [place] [address], [place], [place] [address]  SARS CORONAVIRUS 2 (TAT 6-24 HRS) Nasopharyngeal Nasopharyngeal Swab     Status: Abnormal   Collection Time: 08/01/19  4:57 PM   Specimen: Nasopharyngeal Swab  Result Value Ref Range   SARS Coronavirus 2 POSITIVE (A) NEGATIVE    Comment: RESULT CALLED TO, READ BACK BY AND VERIFIED WITH: J.THOMPTKINS RN 2221 08/01/19 MCCORMICK K (NOTE) SARS-CoV-2 target nucleic acids are DETECTED. The SARS-CoV-2 RNA is generally detectable in upper and lower respiratory specimens during the acute phase of infection. Positive results are indicative of the presence of SARS-CoV-2 RNA. Clinical correlation with patient history and other diagnostic information is  necessary to determine patient infection status. Positive results do not rule out bacterial infection or co-infection with other viruses.  The expected result is Negative. Fact Sheet for Patients: SugarRoll.be Fact Sheet for Healthcare Providers: https://www.woods-mathews.com/ This test is not yet approved or cleared by the [place] FDA and  has been authorized for detection and/or diagnosis of SARS-CoV-2 by FDA under an Emergency Use Authorization (EUA). This EUA will remain  in effect (meaning this test can be used) f or the duration of the COVID-19 declaration under Section 564(b)(1) of the Act, 21 U.S.C. section 360bbb-3(b)(1), unless the authorization is terminated or revoked sooner. Performed at [facility], [place],  [place] [address]     Medications:  Current Facility-Administered Medications  Medication Dose Route Frequency Provider Last Rate Last Admin  . Melatonin TABS 5 mg  5 mg Oral  QHS Joldersma, Logan, PA-C      . QUEtiapine (SEROQUEL) tablet 25 mg  25 mg Oral QHS Joldersma, Logan, PA-C   25 mg at 08/01/19 2111  . sodium chloride flush (NS) 0.9 % injection 3 mL  3 mL Intravenous Once Joldersma, Logan, PA-C      . traZODone (DESYREL) tablet 150 mg  150 mg Oral QHS PRN Rayna Sexton, PA-C       Current Outpatient Medications  Medication Sig Dispense Refill  . acetaminophen (TYLENOL) 500 MG tablet Take 500 mg by mouth in the morning and at bedtime.    Marland Kitchen acetaminophen (TYLENOL) 500 MG tablet Take 500 mg by mouth every 8 (eight) hours as needed for mild pain or moderate pain.    Marland Kitchen alendronate (FOSAMAX) 70 MG tablet Take 70 mg by mouth once a week. Take with a full glass of water on an empty stomach.    Marland Kitchen antiseptic oral rinse (BIOTENE) LIQD 15 mLs by Mouth Rinse route. "Rinse 15 ml by mouth for 30 seconds and spit out 5 times a day as needed. May self administer."    . busPIRone (BUSPAR) 5 MG tablet Take 5 mg by mouth 2 (two) times daily.    . cetirizine (ZYRTEC) 5 MG tablet Take 5 mg by mouth daily.    . clonazePAM (KLONOPIN) 0.5 MG tablet Take 0.5 mg by mouth in the morning, at noon, in the evening, and at bedtime.    Marland Kitchen dextromethorphan (DELSYM) 30 MG/5ML liquid Take 30 mg by mouth 2 (two) times daily as needed for cough.    . Emollient (LUBRIDERM SERIOUSLY SENSITIVE) LOTN Apply 1 application topically daily as needed (dry skin).    . Eyelid Cleansers (OCUSOFT EYELID CLEANSING) PADS Apply topically as directed. "May self administer."    . famotidine (PEPCID) 40 MG tablet Take 40 mg by mouth daily before supper.    Marland Kitchen FLUoxetine (PROZAC) 40 MG capsule Take 40 mg by mouth daily.    Marland Kitchen levothyroxine (SYNTHROID) 50 MCG tablet Take 50 mcg by mouth daily before breakfast.    . Melatonin 5 MG TABS Take 5 mg by mouth at bedtime.    . mometasone (ELOCON) 0.1 % cream Apply 1 application topically See admin instructions. "Around the vagina wall every day; may self-administer, may keep  at bedside."    . Multiple Vitamins-Minerals (MULTIVITAMIN PO) Take 1 tablet by mouth daily.    Marland Kitchen nystatin cream (MYCOSTATIN) Apply 1 application topically See admin instructions. "Apply topically under breast twice daily.  May self adminster and may keep at bedside."    . Ophthalmic Irrigation Solution (OCUSOFT EYE [place]) SOLN Apply to eye as directed. With Ocusoft pads.    Vladimir Faster Glycol-Propyl Glycol (SYSTANE ULTRA OP) Apply 1 drop to eye. Instill 1 drop into both eyes 6 times daily as needed. May self administer. May keep at bedside."    . pramoxine (PROCTOFOAM) 1 % foam Place 1 application rectally See admin instructions. "Apply on the skin twice a day as needed."    . sodium chloride (MURO 128) 5 % ophthalmic solution Place 1 drop into both eyes daily.    . sodium chloride (OCEAN) 0.65 % SOLN nasal spray Place 2 sprays into both nostrils in the morning, at noon, and at bedtime.    . traZODone (  DESYREL) 150 MG tablet Take 150 mg by mouth at bedtime as needed for sleep.    Marland Kitchen amLODipine (NORVASC) 5 MG tablet Take 1 tablet (5 mg total) by mouth daily. (Patient not taking: Reported on 08/01/2019) 30 tablet 0  . bisacodyl (DULCOLAX) 10 MG suppository Place 1 suppository (10 mg total) rectally as needed for moderate constipation. (Patient not taking: Reported on 02/11/2019) 12 suppository 0  . donepezil (ARICEPT) 10 MG tablet Take 1/2 tablet daily for 2 weeks, then increase to 1 tablet daily and continue (Patient not taking: Reported on 08/01/2019) 30 tablet 11  . folic acid (FOLVITE) 1 MG tablet Take 1 tablet (1 mg total) by mouth daily. (Patient not taking: Reported on 02/11/2019) 30 tablet 0  . polyethylene glycol (MIRALAX / GLYCOLAX) packet Take 17 g by mouth daily. (Patient not taking: Reported on 08/01/2019) 14 each 0  . QUEtiapine (SEROQUEL) 25 MG tablet Take 1 tablet (25 mg total) by mouth as needed (May give extra 25 mg tablet once daily as needed for agitation.). (Patient not taking: Reported on  07/21/2019) 30 tablet 0  . QUEtiapine (SEROQUEL) 25 MG tablet Take 1 tablet (25 mg total) by mouth at bedtime. (Patient not taking: Reported on 08/01/2019) 90 tablet 0  . senna-docusate (SENOKOT-S) 8.6-50 MG tablet Take 1 tablet by mouth daily. (Patient not taking: Reported on 08/01/2019) 30 tablet 0    Musculoskeletal: Unable to assess via telepsych  Psychiatric Specialty Exam: Physical Exam  Constitutional: She appears well-developed.  Eyes: Pupils are equal, round, and reactive to light.  Cardiovascular: Normal rate.  Respiratory: Effort normal.  Musculoskeletal:     Cervical back: Normal range of motion.  Neurological: She is alert.  Oriented to name only; patient speech is garbled and difficult to comprehend.  Unable to assess additional level of orientation.     Review of Systems  Psychiatric/Behavioral: Positive for confusion and decreased concentration.    Blood pressure (!) 149/53, pulse (!) 53, temperature 98.3 F (36.8 C), temperature source Oral, resp. rate 16, SpO2 97 %.There is no height or weight on file to calculate BMI.  General Appearance: Bizarre, Fairly Groomed and appears confused  Eye Contact:  Fair  Speech:  Garbled  Volume:  Normal  Mood:  Euphoric and smiling   Affect:  Non-Congruent and Inappropriate  Thought Process:  Descriptions of Associations: Loose  Orientation:  Other:  responds when called by name only  Thought Content:  Illogical and Tangential  Suicidal Thoughts:  unable to assess   Homicidal Thoughts:  unable to assess,   Memory:  unable to assess  Judgement:  Other:  in the setting of altered mental status   Insight:  Lacking  Psychomotor Activity:  Increased  Concentration:  Concentration: Poor and Attention Span: Poor  Recall:  unable to assess in patient with altered mental status and garbled speech  Fund of Knowledge:  NA  Language:  Poor  Akathisia:  Negative  Handed:  Right  AIMS (if indicated):     Assets:  Financial  Resources/Insurance Housing Social Support  ADL's:  Intact  Cognition:  Impaired,  Moderate  Sleep:        Treatment Plan Summary: Daily contact with patient to assess and evaluate symptoms and progress in treatment and Medication management  Disposition: Recommend psychiatric Inpatient admission when medically cleared. SW to continue to seek gero psych placement  This service was provided via telemedicine using a 2-way, interactive audio and Radiographer, therapeutic.  Names of all persons  participating in this telemedicine service and their role in this encounter. Name: Amariya Simer Role: patient  Name: Merlyn Lot Role: Schneider with Dr. Dolly Rias; informed of above recommendation and disposition  Mallie Darting, NP 08/02/2019 2:13 PM

## 2019-08-02 NOTE — ED Notes (Signed)
Pt brother Morrow-requesting call from EDP related to current medication regimen. This nurse notified EDP Mesner of family request.

## 2019-08-02 NOTE — ED Provider Notes (Signed)
The patient has been placed in psychiatric observation due to the need to provide a safe environment for the patient while obtaining psychiatric consultation and evaluation, as well as ongoing medical and medication management to treat the patient's condition.  The patient has not been placed under full IVC at this time.  Pending reeval by psych in AM.   Merrily Pew, MD 08/02/19 [phone]

## 2019-08-03 DIAGNOSIS — F060 Psychotic disorder with hallucinations due to known physiological condition: Secondary | ICD-10-CM

## 2019-08-03 MED ORDER — CEFTRIAXONE SODIUM 1 G IJ SOLR
1.0000 g | Freq: Once | INTRAMUSCULAR | Status: AC
  Administered 2019-08-03: 1 g via INTRAMUSCULAR
  Filled 2019-08-03: qty 10

## 2019-08-03 MED ORDER — LIDOCAINE-EPINEPHRINE 2 %-1:100000 IJ SOLN
INTRAMUSCULAR | Status: AC
  Filled 2019-08-03: qty 1

## 2019-08-03 MED ORDER — CEPHALEXIN 500 MG PO CAPS
500.0000 mg | ORAL_CAPSULE | Freq: Three times a day (TID) | ORAL | Status: DC
  Administered 2019-08-03 – 2019-08-04 (×4): 500 mg via ORAL
  Filled 2019-08-03 (×4): qty 1

## 2019-08-03 MED ORDER — LIDOCAINE HCL 1 % IJ SOLN
INTRAMUSCULAR | Status: AC
  Administered 2019-08-03: 2.1 mL
  Filled 2019-08-03: qty 20

## 2019-08-03 NOTE — ED Provider Notes (Signed)
Emergency Medicine Observation Re-evaluation Note  Sarah Gutierrez is a 74 y.o. female, seen on rounds today.  Pt initially presented to the ED for complaints of Medical Clearance Currently, the patient is awaiting re-eval by psy.  Physical Exam  BP 97/66 (BP Location: Right Arm)   Pulse (!) 58   Temp 98.8 F (37.1 C) (Oral)   Resp 18   SpO2 93%  Physical Exam  ED Course / MDM  EKG:EKG Interpretation  Date/Time:  Friday August 01 2019 19:30:40 EDT Ventricular Rate:  78 PR Interval:  152 QRS Duration: 62 QT Interval:  366 QTC Calculation: 417 R Axis:   74 Text Interpretation: Normal sinus rhythm Nonspecific ST abnormality Abnormal ECG No significant change since last tracing Confirmed by Isla Pence [phone]) on 08/01/2019 7:35:46 PM    I have reviewed the labs performed to date as well as medications administered while in observation.  Recent changes in the last 24 hours include pt feeling well. Plan  Current plan is for placment. Patient is not under full IVC at this time.  Patient's urinalysis shows infection from 2 days ago and she was started on Keflex after being given Rocephin IM.  She has no complaints flank pain or suprapubic pressure or dysuria.  Calm and cooperative.  She has no respiratory complaints at this time although she is Covid positive.  Awaiting behavioral health input   Lacretia Leigh, MD 08/03/19 [phone]

## 2019-08-03 NOTE — Consult Note (Signed)
Attempted to contact patient's brother, per request.  Message left to return call.

## 2019-08-03 NOTE — Consult Note (Signed)
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 Parke Poisson on 08/03/2019.  The patient is resting with her eyes closed.  Her UDS demonstrates UTI for which she is currently receiving antibiotic therapy.  Per nursing, she has remained calm and cooperative but confused.    Collateral information received from her brother Dr. Orland Mustard who is listed as the guardian.  At baseline, the patient is confused and "will not have a normal mental health exam due to encephalopathy".  He states she lives at [facility] residential facility where he would like to keep her, relates he believes this is a good place for her.  Within the past few months, she has become more agitated and residential staff raised concerns of her appropriateness for continued staff.  She was evaluated by the neurologist who made several adjustments to her Seroquel for control for her increased agitation.  Initially states at 25mg  she was "giddy" so the dose was titrated down to 12.5mg  at which she began with hallucinations and was brought to the ED for evaluation.      Total Time spent with patient: 15 minutes    Past Psychiatric History:   Past Medical History:  Past Medical History:  Diagnosis Date  . Hypertension    History reviewed. No pertinent surgical history. Family History:  Family History  Problem Relation Age of Onset  . CAD Brother 58       has coronary stent    Family Psychiatric  History:  Social History:  Social History   Substance and Sexual Activity  Alcohol Use Yes   Comment: hx alcohol abuse per family      Social History   Substance and Sexual Activity  Drug Use Never    Social History   Socioeconomic History  . Marital status: Married    Spouse name: Not on file  . Number of children: Not on file  . Years of education: Not on file  . Highest education level: Not on file  Occupational History  . Not on file  Tobacco Use  . Smoking status: Never Smoker  . Smokeless tobacco: Never Used  Substance and Sexual Activity  . 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 Drug use: Never  . Sexual activity: Not on file  Other Topics Concern  . 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 Difficulty of Paying Living Expenses:   Food Insecurity:   . Worried About Charity fundraiser in the Last Year:   . Arboriculturist in the Last Year:   Transportation Needs:   . Film/video editor (Medical):   Marland Kitchen Lack of Transportation (Non-Medical):   Physical Activity:   . Days of Exercise per Week:   . Minutes of Exercise per Session:   Stress:   . Feeling  of Stress :   Social Connections:   . Frequency of Communication with Friends and Family:   . Frequency of Social Gatherings with Friends and Family:   . Attends Religious Services:   . Active Member of Clubs or Organizations:   . Attends Archivist Meetings:   Marland Kitchen Marital Status:     Sleep: Good  Appetite:  Good  Current Medications: Current Facility-Administered Medications  Medication Dose Route Frequency Provider Last Rate Last Admin  . cephALEXin (KEFLEX) capsule 500 mg  500 mg Oral Q8H Lacretia Leigh, MD   500 mg at 08/03/19 0837  . Melatonin TABS 5 mg  5 mg Oral QHS Joldersma, Logan, PA-C   5 mg at 08/02/19 2156  . QUEtiapine (SEROQUEL) tablet 25 mg  25 mg Oral QHS Rayna Sexton,  PA-C   25 mg at 08/02/19 2156  . sodium chloride flush (NS) 0.9 % injection 3 mL  3 mL Intravenous Once Joldersma, Logan, PA-C      . traZODone (DESYREL) tablet 150 mg  150 mg Oral QHS PRN Rayna Sexton, PA-C       Current Outpatient Medications  Medication Sig Dispense Refill  . acetaminophen (TYLENOL) 325 MG tablet Take 650 mg by mouth every 6 (six) hours as needed for mild pain or moderate pain.    Marland Kitchen amLODipine (NORVASC) 5 MG tablet Take 1 tablet (5 mg total) by mouth daily. 30 tablet 0  . donepezil (ARICEPT) 10 MG tablet Take 1/2 tablet daily for 2 weeks, then increase to 1 tablet daily and continue (Patient taking differently: Take 10 mg by mouth at bedtime. ) 30 tablet 11  . Ensure (ENSURE) Take 237 mLs by mouth 3 (three) times daily between meals.    Marland Kitchen loratadine (CLARITIN) 10 MG tablet Take 10 mg by mouth daily as needed for allergies.    . Multiple Vitamins-Minerals (MULTIVITAMIN PO) Take 1 tablet by mouth daily.    . ondansetron (ZOFRAN) 4 MG tablet Take 4 mg by mouth every 8 (eight) hours as needed for nausea or vomiting.    . polyethylene glycol (MIRALAX / GLYCOLAX) packet Take 17 g by mouth daily. 14 each 0  . QUEtiapine (SEROQUEL) 25 MG tablet Take 1 tablet (25 mg total) by mouth as needed (May give extra 25 mg tablet once daily as needed for agitation.). (Patient taking differently: Take 25 mg by mouth See admin instructions. Take 1 tablet (25mg ) by mouth at bedtime every Monday, Wednesday, and Friday) 30 tablet 0  . QUEtiapine (SEROQUEL) 25 MG tablet Take 1 tablet (25 mg total) by mouth at bedtime. (Patient taking differently: Take 12.5 mg by mouth See admin instructions. Take 1/2 tablet (12.5mg ) by mouth at bedtime every Tuesday, Thursday, Saturday, and Sunday) 90 tablet 0  . senna-docusate (SENOKOT-S) 8.6-50 MG tablet Take 1 tablet by mouth daily. 30 tablet 0  . sertraline (ZOLOFT) 50 MG tablet Take 50 mg by mouth daily.    . vitamin B-12 (CYANOCOBALAMIN) 100 MCG tablet Take  100 mcg by mouth daily.    . bisacodyl (DULCOLAX) 10 MG suppository Place 1 suppository (10 mg total) rectally as needed for moderate constipation. (Patient not taking: Reported on 02/11/2019) 12 suppository 0  . folic acid (FOLVITE) 1 MG tablet Take 1 tablet (1 mg total) by mouth daily. (Patient not taking: Reported on 02/11/2019) 30 tablet 0    Lab Results:  Results for orders placed or performed during the hospital encounter of 08/01/19 (from the past 48 hour(s))  Urinalysis, Routine w reflex microscopic     Status: Abnormal   Collection Time: 08/01/19  4:03 PM  Result Value Ref Range   Color, Urine YELLOW YELLOW   APPearance HAZY (A) CLEAR   Specific Gravity, Urine 1.024 1.005 - 1.030   pH 6.0 5.0 - 8.0   Glucose, UA NEGATIVE NEGATIVE mg/dL   Hgb urine dipstick NEGATIVE NEGATIVE   Bilirubin Urine NEGATIVE NEGATIVE   Ketones, ur NEGATIVE NEGATIVE mg/dL   Protein, ur 30 (A) NEGATIVE mg/dL   Nitrite NEGATIVE NEGATIVE   Leukocytes,Ua LARGE (A) NEGATIVE   RBC / HPF 11-20 0 - 5 RBC/hpf   WBC, UA >50 (H) 0 - 5 WBC/hpf   Bacteria, UA NONE SEEN NONE SEEN   Squamous Epithelial / LPF 0-5 0 - 5   Mucus PRESENT    Hyaline Casts, UA PRESENT     Comment: Performed at [facility], [place] [address], [place], [place] [address]  CBG monitoring, ED     Status: Abnormal   Collection Time: 08/01/19  4:16 PM  Result Value Ref Range   Glucose-Capillary 143 (H) 70 - 99 mg/dL    Comment: Glucose reference range applies only to samples taken after fasting for at least 8 hours.  Vitamin B12     Status: Abnormal   Collection Time: 08/01/19  4:47 PM  Result Value Ref Range   Vitamin B-12 1,021 (H) 180 - 914 pg/mL    Comment: (NOTE) This assay is not validated for testing neonatal or myeloproliferative syndrome specimens for Vitamin B12 levels. Performed at [facility], [place] [address] [place], [place] [address]   Comprehensive metabolic panel     Status: Abnormal    Collection Time: 08/01/19  4:48 PM  Result Value Ref Range   Sodium 140 135 - 145 mmol/L   Potassium 3.6 3.5 - 5.1 mmol/L   Chloride 105 98 - 111 mmol/L   CO2 24 22 - 32 mmol/L   Glucose, Bld 145 (H) 70 - 99 mg/dL    Comment: Glucose reference range applies only to samples taken after fasting for at least 8 hours.   BUN 25 (H) 8 - 23 mg/dL   Creatinine, Ser 0.92 0.44 - 1.00 mg/dL   Calcium 10.5 (H) 8.9 - 10.3 mg/dL   Total Protein 8.3 (H) 6.5 - 8.1 g/dL   Albumin 4.7 3.5 - 5.0 g/dL   AST 26 15 - 41 U/L   ALT 22 0 - 44 U/L   Alkaline Phosphatase 131 (H) 38 - 126 U/L   Total Bilirubin 0.6 0.3 - 1.2 mg/dL   GFR calc non Af Amer >60 >60 mL/min   GFR calc Af Amer >60 >60 mL/min   Anion gap 11 5 - 15    Comment: Performed at [facility], [place] [address] [place], [place] [address]  CBC     Status: None   Collection Time: 08/01/19  4:48 PM  Result Value Ref Range   WBC 10.2 4.0 - 10.5 K/uL   RBC 4.67 3.87 - 5.11 MIL/uL   Hemoglobin 13.8 12.0 - 15.0 g/dL   HCT 42.5 36.0 - 46.0 %   MCV 91.0 80.0 - 100.0 fL   MCH 29.6 26.0 - 34.0 pg   MCHC 32.5 30.0 - 36.0 g/dL   RDW 13.7 11.5 - 15.5 %   Platelets 318 150 - 400 K/uL   nRBC 0.0 0.0 - 0.2 %    Comment: Performed at [facility]  [place] [address] [place], [place] [address]  Ethanol     Status: None   Collection Time: 08/01/19  4:48 PM  Result Value Ref Range   Alcohol, Ethyl (B) <10 <10 mg/dL    Comment: (NOTE) Lowest detectable limit for serum alcohol is 10 mg/dL. For medical purposes only. Performed at [facility], [place] [address], [place], [place] [address]   Ammonia     Status: None   Collection Time: 08/01/19  4:48 PM  Result Value Ref Range   Ammonia 14 9 - 35 umol/L    Comment: Performed at [facility], [place] [address] [place], [place] [address]  SARS CORONAVIRUS 2 (TAT 6-24 HRS) Nasopharyngeal Nasopharyngeal Swab     Status: Abnormal    Collection Time: 08/01/19  4:57 PM   Specimen: Nasopharyngeal Swab  Result Value Ref Range   SARS Coronavirus 2 POSITIVE (A) NEGATIVE    Comment: RESULT CALLED TO, READ BACK BY AND VERIFIED WITH: J.THOMPTKINS RN 2221 08/01/19 MCCORMICK K (NOTE) SARS-CoV-2 target nucleic acids are DETECTED. The SARS-CoV-2 RNA is generally detectable in upper and lower respiratory specimens during the acute phase of infection. Positive results are indicative of the presence of SARS-CoV-2 RNA. Clinical correlation with patient history and other diagnostic information is  necessary to determine patient infection status. Positive results do not rule out bacterial infection or co-infection with other viruses.  The expected result is Negative. Fact Sheet for Patients: SugarRoll.be Fact Sheet for Healthcare Providers: https://www.woods-mathews.com/ This test is not yet approved or cleared by the [place] FDA and  has been authorized for detection and/or diagnosis of SARS-CoV-2 by FDA under an Emergency Use Authorization (EUA). This EUA will remain  in effect (meaning this test can be used) f or the duration of the COVID-19 declaration under Section 564(b)(1) of the Act, 21 U.S.C. section 360bbb-3(b)(1), unless the authorization is terminated or revoked sooner. Performed at [facility], [place] [address] [place], [place] [address]     Blood Alcohol level:  Lab Results  Component Value Date   ETH <10 08/01/2019   ETH <10 02/14/2019    Physical Findings: AIMS:  , ,  ,  ,    CIWA:    COWS:     Musculoskeletal: Unable to assess via telepsych   Treatment Plan Summary: Daily contact with patient to assess and evaluate symptoms and progress in treatment and Medication management    While being evaluated for hallucinations, she was found positive for COVID 19, but asymptomatic with no cough, fever, chills or apparent distress.  Incidentally, her  urinalysis was positive for urinary tract infection as well.  Per EDP note, she has not had physical symptoms for this.  Patient with new onset of hallucinations and increasing agitation in the context of continued medication titration by outpatient neurologist, recent diagnosis of COVID-19 and UTI.   Although she appears asymptomatic for UTI, this could explain her increased agitation and delirium. She has other medical concerns that contribute to or exacerbate her current presentation. She has already been started on antibiotics by the emergency room provider.  Recommend an additional 24 OBS at the emergency department where she can continue to be evaluated for symptom improvement.  She has a gero psych consult pending but her brother does not think this is the ideal placement for her.    No immediate adjustments to seroquel made today.  Again, will reassess in 24 hours to see in treating  UTI addresses agitation concerns.   Mallie Darting, NP 08/03/2019, 1:04 PM

## 2019-08-04 DIAGNOSIS — R4182 Altered mental status, unspecified: Secondary | ICD-10-CM

## 2019-08-04 MED ORDER — CEPHALEXIN 500 MG PO CAPS: 500.0000 mg | ORAL_CAPSULE | Freq: Two times a day (BID) | ORAL | 0 refills | Status: AC

## 2019-08-04 MED ORDER — TRAZODONE HCL 50 MG PO TABS: 50.0000 mg | ORAL_TABLET | Freq: Three times a day (TID) | ORAL | 0 refills | Status: AC | PRN

## 2019-08-04 NOTE — ED Provider Notes (Signed)
10:16 AM Was just informed by psychiatry that patient is going to be psychiatrically cleared from their standpoint.  Chart review shows that patient has Covid as well as current UTI on antibiotics.  Based on this, it does not appear that she needs medical admission at this time.    Psychiatry is placing a social work consult to help facilitate patient being either discharged back to facility or being placed somewhere else since she currently has Covid and UTI.  She will await social work Furniture conservator/restorer for disposition.       Sarah Gutierrez, Gwenyth Allegra, MD 08/04/19 1017

## 2019-08-04 NOTE — Discharge Instructions (Signed)
Your work-up during your ED stay did show evidence of urinary tract infection for which he was started on antibiotics.  Please complete the course of antibiotics for the next 4 days with Keflex.  Please follow-up with your primary doctor in follow-up as directed.  If any symptoms change or worsen, please return to nearest emergency department.

## 2019-08-04 NOTE — ED Notes (Signed)
Pt DCd off unit back to ALF [facility] per provider. Pt alert, calm, cooperative, no s/s of distress. DC information given to [facility] staff for facility. Belongings given [facility] staff for facility. Pt transported by [facility] and off unit on stretcher.

## 2019-08-04 NOTE — Consult Note (Addendum)
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 Mallie Darting; and chart reviewed on 08/04/19.  On evaluation GEVENA Gutierrez reports she is feeling better.  Reports she is eating and sleeping without difficulty, and tolerating medications.  Patient states that she is a little confused but better.  States that she does have a problem with memory "more my recent memory."  States that she is aware she is in a hospital but doesn't know which one.  During evaluation patient is elevated up in bed; she does not appear to be responding to internal/external stimuli or delusional thoughts.  Patient does state that she was seeing other people being hurt but not now.  Patients affect pleasant and congruent with mood.  Patient denies suicidal/self-harm/homicidal ideation, psychosis, and paranoia.  Patient states she is unaware if she has ever been treated for a psychiatric disorder of if she has ever been in a mental hospital "I don't think so."   Patient altered mental status and aggressive behavior more than likely related to urinary tract infection which the patient is being treated with Keflex also history of encephalopathy.  Patient is also being treated with 25 mg of Seroquel for agitation.  Patient will be psychiatrically cleared.  Consult ordered for social work to contact facility to see if patient is able to return since she is Covid+.  Nursing notes also indicates that patient appears better, more aware, thoughts more clear; patient slept through night.     Per patient chart: Reviewed by this  provider: Collateral information received from her brother Dr. Orland Mustard who is listed as the guardian.  At baseline, the patient is confused and "will not have a normal mental health exam due to encephalopathy".  He states she lives at [facility] residential facility where he would like to keep her, relates he believes this is a good place for her.  Within the past few months, she has become more agitated and residential staff raised concerns of her appropriateness for continued staff.  She was evaluated by the neurologist who made several adjustments to her Seroquel for control for her increased agitation.  Initially states at 25mg  she was "giddy" so the dose was titrated down to 12.5mg  at which she began with hallucinations and was brought to the ED for evaluation.     Total Time spent with patient: 20 minutes    Past Psychiatric History: Unaware  Past Medical History:  Past Medical History:  Diagnosis Date  . Hypertension    History reviewed. No pertinent surgical history. Family History:  Family History  Problem Relation Age of Onset  . CAD Brother 63       has coronary stent    Family Psychiatric  History:  Social History:  Social History   Substance and Sexual Activity  Alcohol Use Yes   Comment: hx alcohol abuse per family     Social History   Substance and Sexual Activity  Drug Use Never    Social History   Socioeconomic History  . Marital status: Married    Spouse name: Not on file  .  Number of children: Not on file  . Years of education: Not on file  . Highest education level: Not on file  Occupational History  . Not on file  Tobacco Use  . Smoking status: Never Smoker  . Smokeless tobacco: Never Used  Substance and Sexual Activity  . 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 Drug use: Never  . Sexual activity: Not on file  Other Topics Concern  . 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 Difficulty of Paying Living Expenses:   Food Insecurity:   . Worried About Charity fundraiser in the Last Year:   . Arboriculturist in the Last Year:   Transportation Needs:   . Film/video editor (Medical):   Marland Kitchen Lack of Transportation (Non-Medical):   Physical Activity:   . Days of Exercise per Week:   . Minutes of Exercise per Session:   Stress:   . Feeling of Stress :   Social Connections:   . Frequency of Communication with Friends and Family:   . Frequency of Social Gatherings with Friends and Family:   . Attends Religious Services:   . Active Member of Clubs or Organizations:   . Attends Archivist Meetings:   Marland Kitchen Marital Status:     Sleep: Good  Appetite:  Good  Current Medications: Current Facility-Administered Medications  Medication Dose Route Frequency Provider Last Rate Last Admin  . cephALEXin (KEFLEX) capsule 500 mg  500 mg Oral Q8H Lacretia Leigh, MD   500 mg at 08/04/19 [phone]  . Melatonin TABS 5 mg  5 mg Oral QHS Joldersma, Logan, PA-C   5 mg at 08/03/19 2159  . QUEtiapine (SEROQUEL) tablet 25 mg  25 mg Oral QHS Rayna Sexton, PA-C   25 mg at 08/03/19 2159  . sodium chloride flush (NS) 0.9 % injection 3 mL  3 mL Intravenous Once Joldersma, Logan, PA-C      . traZODone (DESYREL) tablet 150 mg  150 mg Oral QHS PRN Rayna Sexton, PA-C       Current Outpatient Medications  Medication Sig Dispense Refill  . acetaminophen (TYLENOL) 325 MG tablet Take 650 mg by mouth every 6 (six) hours as needed for mild pain or moderate pain.    Marland Kitchen amLODipine (NORVASC) 5 MG tablet Take 1 tablet (5 mg total) by mouth daily. 30 tablet 0  . donepezil (ARICEPT) 10 MG tablet Take 1/2 tablet daily for 2 weeks, then increase to 1 tablet daily and continue (Patient taking differently: Take 10 mg by mouth  at bedtime. ) 30 tablet 11  . Ensure (ENSURE) Take 237 mLs by mouth 3 (three) times daily between meals.    Marland Kitchen loratadine (CLARITIN) 10 MG tablet Take 10 mg by mouth daily as needed for allergies.    . Multiple Vitamins-Minerals (MULTIVITAMIN PO) Take 1 tablet by mouth daily.    . ondansetron (ZOFRAN) 4 MG tablet Take 4 mg by mouth every 8 (eight) hours as needed for nausea or vomiting.    . polyethylene glycol (MIRALAX / GLYCOLAX) packet Take 17 g by mouth daily. 14 each 0  . QUEtiapine (SEROQUEL) 25 MG tablet Take 1  tablet (25 mg total) by mouth as needed (May give extra 25 mg tablet once daily as needed for agitation.). (Patient taking differently: Take 25 mg by mouth See admin instructions. Take 1 tablet (25mg ) by mouth at bedtime every Monday, Wednesday, and Friday) 30 tablet 0  . QUEtiapine (SEROQUEL) 25 MG tablet Take 1 tablet (25 mg total) by mouth at bedtime. (Patient taking differently: Take 12.5 mg by mouth See admin instructions. Take 1/2 tablet (12.5mg ) by mouth at bedtime every Tuesday, Thursday, Saturday, and Sunday) 90 tablet 0  . senna-docusate (SENOKOT-S) 8.6-50 MG tablet Take 1 tablet by mouth daily. 30 tablet 0  . sertraline (ZOLOFT) 50 MG tablet Take 50 mg by mouth daily.    . vitamin B-12 (CYANOCOBALAMIN) 100 MCG tablet Take 100 mcg by mouth daily.    . bisacodyl (DULCOLAX) 10 MG suppository Place 1 suppository (10 mg total) rectally as needed for moderate constipation. (Patient not taking: Reported on 02/11/2019) 12 suppository 0  . folic acid (FOLVITE) 1 MG tablet Take 1 tablet (1 mg total) by mouth daily. (Patient not taking: Reported on 02/11/2019) 30 tablet 0    Lab Results:  No results found for this or any previous visit (from the past 48 hour(s)).  Blood Alcohol level:  Lab Results  Component Value Date   ETH <10 08/01/2019   ETH <10 02/14/2019    Physical Findings: AIMS:  , ,  ,  ,    CIWA:    COWS:     Musculoskeletal: Unable to assess via  telepsych   Treatment Plan Summary: Plan Psychiatrically cleared   Disposition:  Psychiatrically cleared No evidence of imminent risk to self or others at present.   Patient does not meet criteria for psychiatric inpatient admission. Supportive therapy provided about ongoing stressors. Discussed crisis plan, support from social network, calling 911, coming to the Emergency Department, and calling Suicide Hotline.   Spoke with Dr. Sherry Ruffing informed of recommendation and disposition:  I will order social work consults.  EDP will discharge once it has been established that patient can go back to nursing facility with Covid +  Ketrina Boateng, NP 08/04/2019, 10:43 AM

## 2019-08-04 NOTE — ED Provider Notes (Signed)
10:49 AM Social work message saying that patient has been accepted back to her facility.  It appears she is still being treated for UTIs we will continue the Keflex for the next few days to complete her weeklong course of antibiotics.  It appears the patient has now been both medically and psychiatrically cleared the patient be discharged back to her facility.  I went evaluate the patient she has no complaints.  Lungs are clear and chest is nontender.  She is resting comfortably.  She agrees to take antibiotics and back to her facility where she will continue her management.  She is instructed follow-up with her PCP.  11:24 AM Just had a long conversation with the patient's family, Dr. Orland Mustard who was her healthcare power attorney.  He requests that she get a prescription for trazodone which she has taken here during her ED stay that has helped significantly with her agitation.  She will be prescribed this going home as well as the antibiotics.  She will be discharged back to the same facility and he understood return precautions and plan of care.  Clinical Impression: 1. Altered mental status, unspecified altered mental status type   2. Acute cystitis without hematuria   3. COVID-19     Disposition: Discharge  Condition: Good  I have discussed the results, Dx and Tx plan with the pt(& family if present). He/she/they expressed understanding and agree(s) with the plan. Discharge instructions discussed at great length. Strict return precautions discussed and pt &/or family have verbalized understanding of the instructions. No further questions at time of discharge.    New Prescriptions   CEPHALEXIN (KEFLEX) 500 MG CAPSULE    Take 1 capsule (500 mg total) by mouth 2 (two) times daily for 4 days. This to complete your antibiotic course that you started in the emergency department   TRAZODONE (DESYREL) 50 MG TABLET    Take 1 tablet (50 mg total) by mouth 3 (three) times daily as needed  (agitation).    Follow Up: London Pepper, MD [address] [place] [address] [facility] [place] [id number] mc [place]         Mindee Robledo, Gwenyth Allegra, MD 08/04/19 1125

## 2019-08-04 NOTE — BH Assessment (Signed)
[facility] Assessment Progress Note  Per Shuvon Rankin, FNP, this pt does not require psychiatric hospitalization at this time.  Pt is psychiatrically cleared.  No behavioral health referrals are indicated for pt.  Shuvon reports that she will notify EDP.  Pt's nurse, Eustaquio Maize, has been notified.  Jalene Mullet, [place] Triage Specialist [phone]

## 2019-08-04 NOTE — Progress Notes (Signed)
TOC consult received that patient has been psych cleared and will need to return to her ALF [facility]. CSW spoke with [place] with [facility]. Adriana Reams reports patient will be returning to her room 144 and number for report is [phone]. Patient can be transported via [facility].  CSW also made contact with patient's brother who reports he is on his way to see patient and also speak with the EDP if possible.   [place], LCSW Transitions of Care Department [facility] ED [phone]

## 2019-08-04 NOTE — Progress Notes (Signed)
Received Sarah Gutierrez awake in her bed at the change of shift watching the TV. She is pleasant and more aware of her environment. She has been compliant with her medications. She slept throughout the night without incident.

## 2019-08-09 LAB — VITAMIN B1: Vitamin B1 (Thiamine): 205.3 nmol/L — ABNORMAL HIGH (ref 66.5–200.0)

## 2020-01-25 ENCOUNTER — Other Ambulatory Visit: Payer: Self-pay

## 2020-01-25 ENCOUNTER — Emergency Department (HOSPITAL_COMMUNITY): Payer: Medicare Other

## 2020-01-25 ENCOUNTER — Encounter (HOSPITAL_COMMUNITY): Payer: Self-pay | Admitting: Emergency Medicine

## 2020-01-25 ENCOUNTER — Emergency Department (HOSPITAL_COMMUNITY)
Admission: EM | Admit: 2020-01-25 | Discharge: 2020-01-26 | Disposition: A | Discharge status: Home / Self Care | Payer: Medicare Other | Attending: Emergency Medicine | Admitting: Emergency Medicine

## 2020-01-25 DIAGNOSIS — R569 Unspecified convulsions: Secondary | ICD-10-CM | POA: Diagnosis present

## 2020-01-25 DIAGNOSIS — Z79899 Other long term (current) drug therapy: Secondary | ICD-10-CM | POA: Diagnosis not present

## 2020-01-25 DIAGNOSIS — I10 Essential (primary) hypertension: Secondary | ICD-10-CM | POA: Insufficient documentation

## 2020-01-25 DIAGNOSIS — R4182 Altered mental status, unspecified: Secondary | ICD-10-CM | POA: Insufficient documentation

## 2020-01-25 HISTORY — DX: Other symptoms and signs involving cognitive functions and awareness: R41.89

## 2020-01-25 LAB — URINALYSIS, ROUTINE W REFLEX MICROSCOPIC
Bilirubin Urine: NEGATIVE
Glucose, UA: NEGATIVE mg/dL
Hgb urine dipstick: NEGATIVE
Ketones, ur: NEGATIVE mg/dL
Leukocytes,Ua: NEGATIVE
Nitrite: NEGATIVE
Protein, ur: NEGATIVE mg/dL
Specific Gravity, Urine: 1.02 (ref 1.005–1.030)
pH: 6 (ref 5.0–8.0)

## 2020-01-25 LAB — CBC
HCT: 44.6 % (ref 36.0–46.0)
Hemoglobin: 14.2 g/dL (ref 12.0–15.0)
MCH: 29.5 pg (ref 26.0–34.0)
MCHC: 31.8 g/dL (ref 30.0–36.0)
MCV: 92.5 fL (ref 80.0–100.0)
Platelets: 301 10*3/uL (ref 150–400)
RBC: 4.82 MIL/uL (ref 3.87–5.11)
RDW: 13.3 % (ref 11.5–15.5)
WBC: 6.6 10*3/uL (ref 4.0–10.5)
nRBC: 0 % (ref 0.0–0.2)

## 2020-01-25 LAB — BASIC METABOLIC PANEL
Anion gap: 10 (ref 5–15)
BUN: 12 mg/dL (ref 8–23)
CO2: 25 mmol/L (ref 22–32)
Calcium: 11 mg/dL — ABNORMAL HIGH (ref 8.9–10.3)
Chloride: 104 mmol/L (ref 98–111)
Creatinine, Ser: 0.88 mg/dL (ref 0.44–1.00)
GFR calc Af Amer: >60 mL/min (ref >60)
GFR calc non Af Amer: >60 mL/min (ref >60)
Glucose, Bld: 110 mg/dL — ABNORMAL HIGH (ref 70–99)
Potassium: 4.1 mmol/L (ref 3.5–5.1)
Sodium: 139 mmol/L (ref 135–145)

## 2020-01-25 LAB — LACTIC ACID, PLASMA: Lactic Acid, Venous: 1.2 mmol/L (ref 0.5–1.9)

## 2020-01-25 NOTE — Discharge Instructions (Signed)
Sarah Gutierrez was evaluated at [facility] emergency department for a possible seizure-like event and altered mental status.  Her work-up for potential causes of seizures or confusion from a medical standpoint has been negative and at this time it appears that she is back to her normal baseline.  Although seizure has not been entirely excluded patient history we have been provided it is felt unlikely.  We do suggest follow-up closely with her neurologist Dr. Delice Lesch with whom she is scheduled to follow-up in 10 days, this can be arranged sooner it may be advisable.

## 2020-01-25 NOTE — ED Triage Notes (Signed)
Pt to triage via [facility] from [facility].  Pt has history of cognitive impairment.  Pt in hallway and started clinching fist and shaking all over.  Episode lasted approx 5 min.  No seizure history.  Pt denies complaint.

## 2020-01-26 LAB — URINE CULTURE: Culture: NO GROWTH

## 2020-01-26 NOTE — ED Provider Notes (Signed)
[facility] EMERGENCY DEPARTMENT Provider Note   CSN: [id number] Arrival date & time: 01/25/20  0867     History Chief Complaint  Patient presents with  . Seizures    Sarah Gutierrez is a 74 y.o. 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  The patient presents the ED today from nursing home ([facility]) for evaluation of altered mental status and possible seizure-like activity.  Reported the patient was "not herself" throughout a large part of the day today and then was noted to have her "fist balled up", shaking her upper extremities which was interpreted as seizure-like activity and she was sent here for further evaluation.  Collateral history obtained from the patient's brother who is a physician and her power of attorne.  States that the patient's baseline is intelligent though orientation waxes and wanes somewhat, often tangential in her speech.  The history is provided by the EMS personnel, the patient and a relative (Nursing home personnel).  Illness Quality:  Altered mental status, seizure-like activity Severity:  Moderate Onset quality:  Gradual Duration:  1 day Timing:  Intermittent Progression:  Waxing and waning Chronicity:  Recurrent Associated symptoms: no abdominal pain, no chest pain, no cough, no fever, no headaches, no rash, no shortness of breath and no vomiting        Past Medical History:  Diagnosis Date  . Cognitive impairment   . Hypertension     Patient Active Problem List   Diagnosis Date Noted  . Altered mental status 08/02/2019  . 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 Protein-calorie malnutrition, severe 05/29/2018  . Pressure injury of skin 05/29/2018  . AKI (acute kidney injury) ([place])   . Acute metabolic  encephalopathy 61/95/0932  . Dehydration 05/20/2018  . Hypercalcemia 05/20/2018  . Hypernatremia 05/20/2018  . Lactic acid acidosis 05/20/2018  . Elevated troponin 05/20/2018  . Hypertension   . Closed displaced fracture of middle phalanx of left ring finger 12/01/2015    History reviewed. No pertinent surgical history.   OB History   No obstetric history on file.     Family History  Problem Relation Age of Onset  . CAD Brother 32       has coronary stent     Social History   Tobacco Use  . Smoking status: Never Smoker  . Smokeless tobacco: Never Used  Substance Use Topics  . 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 Drug use: Never    Home Medications Prior to Admission medications   Medication Sig Start Date End Date Taking? Authorizing Provider  acetaminophen (TYLENOL) 325 MG tablet Take 650 mg by mouth every 6 (six) hours as needed for mild pain or moderate pain.    [provider]  amLODipine (NORVASC) 5 MG tablet Take 1 tablet (5 mg total) by mouth daily. 05/30/18   Dessa Phi, DO  bisacodyl (DULCOLAX) 10 MG suppository Place 1 suppository (10 mg total) rectally as needed for moderate constipation. Patient not taking: Reported on 02/11/2019 07/12/18   Hayden Rasmussen, MD  donepezil (ARICEPT) 10 MG tablet Take 1/2 tablet daily for 2 weeks, then increase to 1 tablet daily and continue Patient taking differently: Take 10 mg by mouth at bedtime.  10/23/18   Cameron Sprang, MD  Ensure (ENSURE) Take 237 mLs by  mouth 3 (three) times daily between meals.    [provider]  folic acid (FOLVITE) 1 MG tablet Take 1 tablet (1 mg total) by mouth daily. Patient not taking: Reported on 02/11/2019 05/30/18   Dessa Phi, DO  loratadine (CLARITIN) 10 MG tablet Take 10 mg by mouth daily as needed for allergies.    [provider]  Multiple Vitamins-Minerals (MULTIVITAMIN PO) Take 1 tablet by mouth daily.    [provider]    ondansetron (ZOFRAN) 4 MG tablet Take 4 mg by mouth every 8 (eight) hours as needed for nausea or vomiting.    [provider]  polyethylene glycol (MIRALAX / GLYCOLAX) packet Take 17 g by mouth daily. 06/01/18   Dessa Phi, DO  QUEtiapine (SEROQUEL) 25 MG tablet Take 1 tablet (25 mg total) by mouth as needed (May give extra 25 mg tablet once daily as needed for agitation.). Patient taking differently: Take 25 mg by mouth See admin instructions. Take 1 tablet (25mg ) by mouth at bedtime every Monday, Wednesday, and Friday 02/14/19   Charlesetta Shanks, MD  QUEtiapine (SEROQUEL) 25 MG tablet Take 1 tablet (25 mg total) by mouth at bedtime. Patient taking differently: Take 12.5 mg by mouth See admin instructions. Take 1/2 tablet (12.5mg ) by mouth at bedtime every Tuesday, Thursday, Saturday, and Sunday 02/14/19   Charlesetta Shanks, MD  senna-docusate (SENOKOT-S) 8.6-50 MG tablet Take 1 tablet by mouth daily. 06/01/18   Dessa Phi, DO  sertraline (ZOLOFT) 50 MG tablet Take 50 mg by mouth daily.    [provider]  traZODone (DESYREL) 50 MG tablet Take 1 tablet (50 mg total) by mouth 3 (three) times daily as needed (agitation). 08/04/19   Tegeler, Gwenyth Allegra, MD  vitamin B-12 (CYANOCOBALAMIN) 100 MCG tablet Take 100 mcg by mouth daily.    [provider]    Allergies    Codeine, Doxycycline, Griseofulvin ultramicrosize [griseofulvin], Halcion [triazolam], Oysters [shellfish allergy], and Penicillins  Review of Systems   Review of Systems  Constitutional: Negative for chills and fever.  HENT: Negative for facial swelling and voice change.   Eyes: Negative for redness and visual disturbance.  Respiratory: Negative for cough and shortness of breath.   Cardiovascular: Negative for chest pain and palpitations.  Gastrointestinal: Negative for abdominal pain and vomiting.  Genitourinary: Negative for difficulty urinating and dysuria.  Musculoskeletal: Negative for gait  problem and joint swelling.  Skin: Negative for rash and wound.  Neurological: Negative for dizziness, seizures and headaches.       Patient denies seizures  Psychiatric/Behavioral: Negative for confusion and suicidal ideas.       Patient denies confusion, endorses she does get "worked up"    Physical Exam Updated Vital Signs BP (!) 159/86 (BP Location: Right Arm)   Pulse 71   Temp 98.1 F (36.7 C) (Oral)   Resp 18   SpO2 98%   Physical Exam Constitutional:      General: She is not in acute distress. HENT:     Head: Normocephalic and atraumatic.     Mouth/Throat:     Mouth: Mucous membranes are moist.     Pharynx: Oropharynx is clear.  Eyes:     General: No scleral icterus.    Pupils: Pupils are equal, round, and reactive to light.  Cardiovascular:     Rate and Rhythm: Normal rate and regular rhythm.     Pulses: Normal pulses.  Pulmonary:     Effort: Pulmonary effort is normal. No respiratory distress.  Abdominal:     General: There is no distension.     Tenderness: There is no abdominal tenderness.  Musculoskeletal:        General: No tenderness or deformity.     Cervical back: Normal range of motion and neck supple.  Neurological:     General: No focal deficit present.     Mental Status: She is alert and oriented to person, place, and time.     Cranial Nerves: No cranial nerve deficit.     Sensory: No sensory deficit.     Motor: No weakness.     Coordination: Coordination normal.     Gait: Gait normal.     Comments: Significant tangential speech noted though orientation questions correct.  Patient is a poor historian  Psychiatric:        Mood and Affect: Mood normal.        Behavior: Behavior normal.     ED Results / Procedures / Treatments   Labs (all labs ordered are listed, but only abnormal results are displayed) Labs Reviewed  BASIC METABOLIC PANEL - Abnormal; Notable for the following components:      Result Value   Glucose, Bld 110 (*)    Calcium  11.0 (*)    All other components within normal limits  URINALYSIS, ROUTINE W REFLEX MICROSCOPIC - Abnormal; Notable for the following components:   APPearance HAZY (*)    All other components within normal limits  URINE CULTURE  CBC  LACTIC ACID, PLASMA  LACTIC ACID, PLASMA    EKG None  Radiology CT Head Wo Contrast  Result Date: 01/25/2020 CLINICAL DATA:  Mental status change EXAM: CT HEAD WITHOUT CONTRAST TECHNIQUE: Contiguous axial images were obtained from the base of the skull through the vertex without intravenous contrast. COMPARISON:  CT dated August 01, 2019 FINDINGS: Brain: No hemorrhage. No extraaxial collection.No midline shift. The ventricular system is unremarkable.The basal cisterns are unremarkable. The grey white differentiation is unremarkable. The brainstem is unremarkable. The cerebellum is unremarkable. The sella is unremarkable. Vascular: No hyperdense vessel or unexpected calcification. Skull: The calvarium is unremarkable. The skull base is unremarkable. The visualized upper cervical spine is unremarkable. Sinuses/Orbits: The visualized orbits are unremarkable. There is an air-fluid level in the right sphenoid sinus. Otherwise, the remaining paranasal sinuses are clear. The mastoid air cells are clear. Other: The visualized parotid gland is unremarkable. There is no scalp soft tissue swelling. IMPRESSION: No acute intracranial abnormality. Electronically Signed   By: Constance Holster M.D.   On: 01/25/2020 21:14   DG Chest Portable 1 View  Result Date: 01/25/2020 CLINICAL DATA:  Altered mental status. EXAM: PORTABLE CHEST 1 VIEW COMPARISON:  August 01, 2019 FINDINGS: The lungs are hyperinflated. There is no evidence of acute infiltrate, pleural effusion or pneumothorax. The heart size and mediastinal contours are within normal limits. A chronic seventh right rib fracture is seen. This is present on the prior study. IMPRESSION: Stable exam. No acute or active  cardiopulmonary disease. Electronically Signed   By: Virgina Norfolk M.D.   On: 01/25/2020 21:42    Procedures Procedures (including critical care time)  Medications Ordered in ED Medications - No data to display  ED Course  I have reviewed the triage vital signs and the nursing notes.  Pertinent labs & imaging results that were available during my care of the patient were reviewed by me and considered in my medical decision making (see chart for details).    MDM Rules/Calculators/A&P  Differential diagnosis considered: Seizure, metabolic encephalopathy, psychosis, delirium, dementia, ICH or head trauma, intoxication, withdrawal  Patient presents the ED due to concerns for altered mental status and possible seizure-like activity.  She has had several prior presentations and some admissions for the same, for baseline the patient is tangential with waxing and waning response orientation questions and on my evaluation, she appears to be consistent with that baseline though history is limited from nursing facility it appears that the seizure-like episode was the reason for presentation ultimately.  No reported postictal.  In description of patient standing with upper extremity shaking not likely represent new onset seizure although we will initiate new seizure work-up including CT head, CBC, chemistries will evaluate medications.  CT head unremarkable without any concerning findings to cause seizure or any significant encephalopathy, CBC and chemistries unremarkable, lactic acid negative and urinalysis showing no evidence of infection.  Chest x-ray also obtained and unremarkable  Patient without any clear findings suggestive of toxic metabolic cephalopathy, there have not been any recent medication changes and no concerning medications cause this presentation  Reevaluation, patient's orientation questions worsen, stating that it is 2007 though I suspect this is  likely consistent with her baseline and may represent sundowning.  Her medical standpoint she is stable I suggest close follow-up with her neurologist with whom she is already scheduled to follow-up in 1 week.  Labs reviewed and interpreted by myself with significant findings above. Imaging reviewed by myself and interpreted by radiologist.  Case and plan above discussed with my attending Dr. Dayna Barker  Final Clinical Impression(s) / ED Diagnoses Final diagnoses:  Seizure-like activity ([place])    Rx / DC Orders ED Discharge Orders    None     Labs, studies and imaging reviewed by myself and considered in medical decision making if ordered. Imaging interpreted by radiology. Pt was discussed with my attending, Dr. Dayna Barker  Electronically signed by:  Roderic Palau [place]9/13/20211:44 AM       Renold Genta, MD 01/26/20 0144    Merrily Pew, MD 01/26/20 2324

## 2020-02-04 ENCOUNTER — Encounter: Payer: Self-pay | Admitting: Neurology

## 2020-02-04 ENCOUNTER — Ambulatory Visit (INDEPENDENT_AMBULATORY_CARE_PROVIDER_SITE_OTHER): Payer: Medicare Other | Admitting: Neurology

## 2020-02-04 ENCOUNTER — Other Ambulatory Visit: Payer: Self-pay

## 2020-02-04 VITALS — BP 181/78 | HR 75 | Ht 68.0 in | Wt 124.0 lb

## 2020-02-04 DIAGNOSIS — F04 Amnestic disorder due to known physiological condition: Secondary | ICD-10-CM | POA: Diagnosis not present

## 2020-02-04 NOTE — Patient Instructions (Signed)
Good to see you! Continue all your medications. Continue activities at [facility], try playing that piano! Follow-up in 6 months, call for any changes   RECOMMENDATIONS FOR ALL PATIENTS WITH MEMORY PROBLEMS: 1. Continue to exercise (Recommend 30 minutes of walking everyday, or 3 hours every week) 2. Increase social interactions - continue going to [place] and enjoy social gatherings with friends and family 3. Eat healthy, avoid fried foods and eat more fruits and vegetables 4. Maintain adequate blood pressure, blood sugar, and blood cholesterol level. Reducing the risk of stroke and cardiovascular disease also helps promoting better memory. 5. Avoid stressful situations. Live a simple life and avoid aggravations. Organize your time and prepare for the next day in anticipation. 6. Sleep well, avoid any interruptions of sleep and avoid any distractions in the bedroom that may interfere with adequate sleep quality 7. Avoid sugar, avoid sweets as there is a strong link between excessive sugar intake, diabetes, and cognitive impairment The Mediterranean diet has been shown to help patients reduce the risk of progressive memory disorders and reduces cardiovascular risk. This includes eating fish, eat fruits and green leafy vegetables, nuts like almonds and hazelnuts, walnuts, and also use olive oil. Avoid fast foods and fried foods as much as possible. Avoid sweets and sugar as sugar use has been linked to worsening of memory function.  There is always a concern of gradual progression of memory problems. If this is the case, then we may need to adjust level of care according to patient needs. Support, both to the patient and caregiver, should then be put into place.

## 2020-02-04 NOTE — Progress Notes (Signed)
NEUROLOGY FOLLOW UP OFFICE NOTE  AHAVA KISSOON [id number] December 15, 1945  HISTORY OF PRESENT ILLNESS: I had the pleasure of seeing Sarah Gutierrez in follow-up in the neurology clinic on 02/04/2020. She is again accompanied by her brother, Dr. Orland Mustard, who helps supplement the history today.  The patient was last seen 6 months ago for dementia. She had an acute mental status change in January 2020 with prolonged encephalopathy with nystagmus and gait ataxia. Thiamine was low on admission, constellation of symptoms strongly suggestive of Wernicke encephalopathy. MRI brain in 08/2018 no acute changes, there was mild diffuse atrophy and minimal chronic microvascular disease. Repeat thiamine level in 11/2018 was normal. She is on Donepezil 10mg  daily and Sertraline 50mg  daily with no side effects. She was having episodes of acute anxiety with crying spells and was started on Seroquel. On her last visit, speech was more slurred, felt possibly due to Seroquel. Seroquel dose slightly reduced, however she was in the ER in 07/2019 for altered mental status, delusions, hallucinations. She had another MRI brain with and without contrast done with no acute changes, similar to prior exam. Thiamine level was 205.3. She was found to have COVID and treated for a UTI also, then discharged on Trazodone 50mg  TID prn and Seroquel 25mg  qhs. She was back in the ER on 01/25/20 for change in mental status, then she had her "fist balled up" shaking upper extremities, interpreted as seizure-like activity. She was tangential in the ER (baseline). Bloodwork, UA, head CT negative. No further similar episodes. Her brother feels her memory is better. She is more fluent today, answering questions appropriately (except for orientation questions). Dr. Orland Mustard has not received much calls about agitation. She is again dysarthric today and reports she is doing a lot of reading. She denies any headaches, dizziness, vision changes, focal  numbness/tingling/weakness, no falls. She is wheelchair-bound at this point.    History on Initial Assessment 07/31/2018: This is a pleasant 74 year old right-handed woman with a history of hypertension, remote alcohol abuse, presenting for evaluation of continued mental status changes. Records from her hospitalization and report from her brother were reviewed, this appears to be quite an acute change for her. She had been living alone independently with family checking in on regularly. She apparently did not answer phone calls since Christmas so her brother called law enforcement on 05/20/2018 for a wellness check. They found her on the couch, non-verbal, following only basic commands. There was half a can of beer without other alcohol noted. She was non-verbal and not following commands in the ER.  Bloodwork showed sodium of 155, calcium 12, WBC 10.8, lactic acid 5.33, negative UA, EtOH level <10, ammonia 20, CD 53, BUN 58, creatinine 1.68. Head CT no acute changes. B1 level was low 28.3. 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 She was discharged to rehab mid-January then moved to [facility] because she still has not returned to baseline. Her brother provided additional information that she had been sober for many years but family feels she has recently started drinking beer again within the past 6 months. Her brother found a case of beer in the pantry and found 4-6 empty cans in her living room. Prior to hospitalization, she was cognitively pretty good but not totally normal. She has a Oceanographer in Nutritional therapist. As far as they knew, she  had been up to date with bill payments, very active walking 3 miles daily, driving, doing all her yardwork. She has never been very social. Now she does not know what the year  or day of the week is, and confabulates quite a bit. He feels she may have had mild confabulation prior. She is aware that she cannot come up with the right words.  He notes she has some nystagmus and tremor that is new, and difficulty with gait and balance. He has not noticed any personality changes, she is very pleasant.   During her visit, she appears to answer questions appropriately but has word-finding difficulties and slow to respond, having to think about her answers. She reports occasional headaches mostly behind her left eye, sometimes affecting her vision. She reports vertical diplopia. She has dizziness when standing. She has numbness over her left wrist. She denies any bowel/bladder incontinence, but family reports she wears Depends. There have been 2 instances where they came to [facility] to find her caked with feces, last week she was on the floor covered with feces (but with a clean diaper). She was previously on Ativan TID, this was discontinued 3/6 but she has been receiving prn Ativan once a day for agitation and restlessness, wanting to get up. Family has not seen the agitation that SNF describes. They report she is not sleeping and wanting to get up. She is very unsteady, PT has recommended wheelchair only, she would fall back when standing. Family denies any hallucinations. He reports an 11-lb weight loss at the SNF because she was refusing food, after family intervened, this has improved. Strength is better than 6 weeks ago.   PAST MEDICAL HISTORY: Past Medical History:  Diagnosis Date  . Cognitive impairment   . Hypertension     MEDICATIONS: Current Outpatient Medications on File Prior to Visit  Medication Sig Dispense Refill  . acetaminophen (TYLENOL) 325 MG tablet Take 650 mg by mouth every 6 (six) hours as needed for mild pain or moderate pain.    Marland Kitchen amLODipine (NORVASC) 5 MG tablet Take 1 tablet (5 mg total) by mouth daily. 30 tablet 0  . bisacodyl (DULCOLAX) 10 MG  suppository Place 1 suppository (10 mg total) rectally as needed for moderate constipation. (Patient not taking: Reported on 02/11/2019) 12 suppository 0  . donepezil (ARICEPT) 10 MG tablet Take 1/2 tablet daily for 2 weeks, then increase to 1 tablet daily and continue (Patient taking differently: Take 10 mg by mouth at bedtime. ) 30 tablet 11  . Ensure (ENSURE) Take 237 mLs by mouth 3 (three) times daily between meals.    . folic acid (FOLVITE) 1 MG tablet Take 1 tablet (1 mg total) by mouth daily. (Patient not taking: Reported on 02/11/2019) 30 tablet 0  . loratadine (CLARITIN) 10 MG tablet Take 10 mg by mouth daily as needed for allergies.    . Multiple Vitamins-Minerals (MULTIVITAMIN PO) Take 1 tablet by mouth daily.    . ondansetron (ZOFRAN) 4 MG tablet Take 4 mg by mouth every 8 (eight) hours as needed for nausea or vomiting.    . polyethylene glycol (MIRALAX / GLYCOLAX) packet Take 17 g by mouth daily. 14 each 0  . QUEtiapine (SEROQUEL) 25 MG tablet Take 1 tablet (25 mg total) by mouth as needed (May give extra 25 mg tablet once daily as needed for agitation.). (Patient taking differently: Take 25 mg by mouth See admin instructions. Take 1 tablet (25mg ) by mouth at bedtime every Monday, Wednesday,  and Friday) 30 tablet 0  . QUEtiapine (SEROQUEL) 25 MG tablet Take 1 tablet (25 mg total) by mouth at bedtime. (Patient taking differently: Take 12.5 mg by mouth See admin instructions. Take 1/2 tablet (12.5mg ) by mouth at bedtime every Tuesday, Thursday, Saturday, and Sunday) 90 tablet 0  . senna-docusate (SENOKOT-S) 8.6-50 MG tablet Take 1 tablet by mouth daily. 30 tablet 0  . sertraline (ZOLOFT) 50 MG tablet Take 50 mg by mouth daily.    . traZODone (DESYREL) 50 MG tablet Take 1 tablet (50 mg total) by mouth 3 (three) times daily as needed (agitation). 30 tablet 0  . vitamin B-12 (CYANOCOBALAMIN) 100 MCG tablet Take 100 mcg by mouth daily.     No current facility-administered medications on file  prior to visit.    ALLERGIES: Allergies  Allergen Reactions  . Codeine Other (See Comments)    Allergy not listed on MAR, and per brother the pt has no reaction  . Doxycycline Other (See Comments)    Allergy not listed on MAR, and per brother the pt has no reaction  . Griseofulvin Ultramicrosize [Griseofulvin] Other (See Comments)    Allergy not listed on MAR, and per brother the pt has no reaction  . Halcion [Triazolam] Other (See Comments)    Allergy not listed on MAR, and per brother the pt has no reaction  . Oysters [Shellfish Allergy] Nausea And Vomiting  . Penicillins Other (See Comments)    Allergy not listed on MAR, and per brother the pt has no reaction    FAMILY HISTORY: Family History  Problem Relation Age of Onset  . CAD Brother 53       has coronary stent     SOCIAL HISTORY: Social History   Socioeconomic History  . Marital status: Married    Spouse name: Not on file  . Number of children: Not on file  . Years of education: Not on file  . Highest education level: Not on file  Occupational History  . Not on file  Tobacco Use  . Smoking status: Never Smoker  . Smokeless tobacco: Never Used  Vaping Use  . Vaping Use: Never used  Substance and Sexual Activity  . 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 Drug use: Never  . Sexual activity: Not Currently  Other Topics Concern  . 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 Difficulty of Paying Living Expenses: Not on file  Food Insecurity:   . Worried About Charity fundraiser in the Last Year: Not on file  . Ran Out of Food in the Last Year: Not on file  Transportation Needs:   . Lack of Transportation (Medical): Not on file  . Lack of Transportation (Non-Medical):  Not on file  Physical Activity:   . Days of Exercise per Week: Not on file  . Minutes of Exercise per Session: Not on file  Stress:   . Feeling of Stress : Not on file  Social Connections:   . Frequency of Communication with Friends and Family: Not on file  . Frequency of Social Gatherings with Friends and Family: Not on file  . Attends Religious Services: Not on  file  . Active Member of Clubs or Organizations: Not on file  . Attends Archivist Meetings: Not on file  . Marital Status: Not on file  Intimate Partner Violence:   . Fear of Current or Ex-Partner: Not on file  . Emotionally Abused: Not on file  . Physically Abused: Not on file  . Sexually Abused: Not on file     PHYSICAL EXAM: Vitals:   02/04/20 1449  BP: (!) 181/78  Pulse: 75  SpO2: 96%   General: No acute distress, sitting on wheelchair Head:  Normocephalic/atraumatic Skin/Extremities: No rash, no edema Neurological Exam: alert and oriented to person, city/state. No aphasia, mild dysarthria. Fund of knowledge is reduced.  Recent and remote memory are impaired.  Attention and concentration are normal.  MMSE 18/30 MMSE - Mini Mental State Exam 02/04/2020 07/31/2018  Orientation to time 0 0  Orientation to Place 2 3  Registration 3 3  Attention/ Calculation 5 2  Recall 0 0  Language- name 2 objects 2 0  Language- repeat 1 1  Language- follow 3 step command 3 2  Language- read & follow direction 1 1  Write a sentence 1 1  Copy design 0 0  Total score 18 13    Cranial nerves: Pupils equal, round. Extraocular movements intact with no nystagmus. Visual fields full.  No facial asymmetry.  Motor: Bulk and tone normal, muscle strength 5/5 throughout with no pronator drift. Ataxia on finger to nose and heel to shin testing. Gait ataxic, needs 2-person assist to stand and take 1-2 steps.   IMPRESSION: This is a pleasant 74 yo RH woman with a history of  history of hypertension, remote alcohol abuse who had  resumed alcohol intake, with Korsakoff syndrome. She initially presented with acute mental status changes in January 2020 with nystagmus and gait ataxia. Thiamine level low on admission, constellation of symptoms strongly suggestive of Wernicke encephalopathy. MRI brain 08/2018 and most recently 07/2019 no acute changes, with mild diffuse atrophy and minimal chronic microvascular disease. Repeat thiamine levels have been normal. MMSE today 18/30. She is more alert and fluent today, however continues to have cognitive deficits and ataxia. Continue Donepezil 10mg  daily, we have agreed to hold off on adding Memantine at this time. Anxiety/agitation appears to have quieted down as well, most recent episode more suggestive of agitation rather than seizure. Continue Sertraline 50mg  daily, Seroquel 25mg  qhs, she has Trazodone prn TID which has helped with agitation. Continue 24/7 care, follow-up in 6 months. They know to call for any changes.   Thank you for allowing me to participate in her care.  Please do not hesitate to call for any questions or concerns.   Sarah Gutierrez, M.D.   CC: Dr. Orland Mustard

## 2020-05-12 ENCOUNTER — Encounter (HOSPITAL_COMMUNITY): Payer: Self-pay | Admitting: Radiology

## 2020-05-12 ENCOUNTER — Emergency Department (HOSPITAL_COMMUNITY)
Admission: EM | Admit: 2020-05-12 | Discharge: 2020-05-13 | Disposition: A | Discharge status: Home / Self Care | Payer: Medicare Other | Attending: Emergency Medicine | Admitting: Emergency Medicine

## 2020-05-12 ENCOUNTER — Other Ambulatory Visit: Payer: Self-pay

## 2020-05-12 ENCOUNTER — Emergency Department (HOSPITAL_COMMUNITY): Payer: Medicare Other

## 2020-05-12 DIAGNOSIS — R410 Disorientation, unspecified: Secondary | ICD-10-CM

## 2020-05-12 DIAGNOSIS — Z79899 Other long term (current) drug therapy: Secondary | ICD-10-CM | POA: Insufficient documentation

## 2020-05-12 DIAGNOSIS — N3000 Acute cystitis without hematuria: Secondary | ICD-10-CM

## 2020-05-12 DIAGNOSIS — R1032 Left lower quadrant pain: Secondary | ICD-10-CM | POA: Diagnosis present

## 2020-05-12 DIAGNOSIS — F0390 Unspecified dementia without behavioral disturbance: Secondary | ICD-10-CM | POA: Diagnosis not present

## 2020-05-12 DIAGNOSIS — I10 Essential (primary) hypertension: Secondary | ICD-10-CM | POA: Diagnosis not present

## 2020-05-12 DIAGNOSIS — G529 Cranial nerve disorder, unspecified: Secondary | ICD-10-CM | POA: Diagnosis not present

## 2020-05-12 DIAGNOSIS — R911 Solitary pulmonary nodule: Secondary | ICD-10-CM

## 2020-05-12 LAB — COMPREHENSIVE METABOLIC PANEL
ALT: 15 U/L (ref 0–44)
AST: 19 U/L (ref 15–41)
Albumin: 4.6 g/dL (ref 3.5–5.0)
Alkaline Phosphatase: 107 U/L (ref 38–126)
Anion gap: 10 (ref 5–15)
BUN: 22 mg/dL (ref 8–23)
CO2: 25 mmol/L (ref 22–32)
Calcium: 11 mg/dL — ABNORMAL HIGH (ref 8.9–10.3)
Chloride: 106 mmol/L (ref 98–111)
Creatinine, Ser: 0.77 mg/dL (ref 0.44–1.00)
GFR, Estimated: >60 mL/min (ref >60)
Glucose, Bld: 113 mg/dL — ABNORMAL HIGH (ref 70–99)
Potassium: 3.7 mmol/L (ref 3.5–5.1)
Sodium: 141 mmol/L (ref 135–145)
Total Bilirubin: 0.6 mg/dL (ref 0.3–1.2)
Total Protein: 8 g/dL (ref 6.5–8.1)

## 2020-05-12 LAB — CBC
HCT: 43.3 % (ref 36.0–46.0)
Hemoglobin: 14.1 g/dL (ref 12.0–15.0)
MCH: 29.7 pg (ref 26.0–34.0)
MCHC: 32.6 g/dL (ref 30.0–36.0)
MCV: 91.2 fL (ref 80.0–100.0)
Platelets: 328 10*3/uL (ref 150–400)
RBC: 4.75 MIL/uL (ref 3.87–5.11)
RDW: 13.2 % (ref 11.5–15.5)
WBC: 7.6 10*3/uL (ref 4.0–10.5)
nRBC: 0 % (ref 0.0–0.2)

## 2020-05-12 LAB — URINALYSIS, ROUTINE W REFLEX MICROSCOPIC
Bilirubin Urine: NEGATIVE
Glucose, UA: NEGATIVE mg/dL
Hgb urine dipstick: NEGATIVE
Ketones, ur: NEGATIVE mg/dL
Nitrite: NEGATIVE
Protein, ur: NEGATIVE mg/dL
Specific Gravity, Urine: 1.025 (ref 1.005–1.030)
pH: 5 (ref 5.0–8.0)

## 2020-05-12 LAB — LIPASE, BLOOD: Lipase: 26 U/L (ref 11–51)

## 2020-05-12 MED ORDER — CEPHALEXIN 500 MG PO CAPS
500.0000 mg | ORAL_CAPSULE | Freq: Once | ORAL | Status: AC
  Administered 2020-05-12: 500 mg via ORAL
  Filled 2020-05-12: qty 1

## 2020-05-12 MED ORDER — IOHEXOL 300 MG/ML  SOLN
100.0000 mL | Freq: Once | INTRAMUSCULAR | Status: AC | PRN
  Administered 2020-05-12: 100 mL via INTRAVENOUS

## 2020-05-12 MED ORDER — CEPHALEXIN 500 MG PO CAPS: 500.0000 mg | ORAL_CAPSULE | Freq: Four times a day (QID) | ORAL | 0 refills | Status: DC

## 2020-05-12 NOTE — ED Triage Notes (Signed)
Pt BIBA from [facility]-  Per EMS- Pt c/o abdominal pain, increased confusion x1 day. Pain in LLQ, periumbilical. Denies n/v/diarrhea, LBM yesterday.

## 2020-05-12 NOTE — ED Notes (Signed)
[facility] called, transportation arranged. °

## 2020-05-12 NOTE — ED Provider Notes (Signed)
[facility] DEPT Provider Note   CSN: [id number] Arrival date & time: 05/12/20  1053     History Chief Complaint  Patient presents with  . Abdominal Pain    Sarah Gutierrez is a 74 y.o. female.  Level 5 caveat pertains to her history of dementia.  Patient brought in from [facility] facility for abdominal pain increased confusion x1 day.  They stated pain was left lower quadrant periumbilical.  They denied any nausea vomiting or diarrhea.  Patient here states that she is got no abdominal pain.  And everything is fine.  Patient's chart reviewed.  Patient has some longstanding dementia.  Also has had some trouble with low thiamine's in the past but of late that has been corrected.  Patient appears no acute distress here today.        Past Medical History:  Diagnosis Date  . Cognitive impairment   . Hypertension     Patient Active Problem List   Diagnosis Date Noted  . Altered mental status 08/02/2019  . 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 Protein-calorie malnutrition, severe 05/29/2018  . Pressure injury of skin 05/29/2018  . AKI (acute kidney injury) ([place])   . Acute metabolic encephalopathy [id number]  . Dehydration 05/20/2018  . Hypercalcemia 05/20/2018  . Hypernatremia 05/20/2018  . Lactic acid acidosis 05/20/2018  . Elevated troponin 05/20/2018  . Hypertension   . Closed displaced fracture of middle phalanx of left ring finger 12/01/2015    No past surgical history on file.   OB History   No obstetric history on file.     Family History  Problem Relation Age of Onset  . CAD Brother 73       has coronary stent     Social History   Tobacco Use  . Smoking status: Never Smoker  . Smokeless tobacco: Never Used  Vaping Use  . Vaping Use: Never used  Substance Use Topics  . 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 Drug use: Never    Home  Medications Prior to Admission medications   Medication Sig Start Date End Date Taking? Authorizing Provider  acetaminophen (TYLENOL) 325 MG tablet Take 650 mg by mouth every 6 (six) hours as needed for mild pain or moderate pain.    [provider]  amLODipine (NORVASC) 2.5 MG tablet Take 2.5 mg by mouth daily. 01/26/20   [provider]  donepezil (ARICEPT) 10 MG tablet Take 1/2 tablet daily for 2 weeks, then increase to 1 tablet daily and continue Patient taking differently: Take 10 mg by mouth at bedtime.  10/23/18   Cameron Sprang, MD  Ensure (ENSURE) Take 237 mLs by mouth 3 (three) times daily between meals.    [provider]  loratadine (CLARITIN) 10 MG tablet Take 10 mg by mouth daily as needed for allergies.    [provider]  Multiple Vitamins-Minerals (MULTIVITAMIN PO) Take 1 tablet by mouth daily.    [provider]  ondansetron (ZOFRAN) 4 MG tablet Take 4 mg by mouth every 8 (eight) hours as needed for nausea or vomiting.    [provider]  polyethylene glycol (MIRALAX / GLYCOLAX) packet Take 17 g by mouth daily. 06/01/18   Dessa Phi, DO  QUEtiapine (SEROQUEL) 25 MG tablet Take 1 tablet (25 mg total) by mouth as needed (May give extra 25 mg tablet once daily as needed for agitation.). Patient  taking differently: Take 25 mg by mouth See admin instructions. Take 1 tablet (25mg ) by mouth at bedtime 02/14/19   Charlesetta Shanks, MD  senna-docusate (SENOKOT-S) 8.6-50 MG tablet Take 1 tablet by mouth daily. 06/01/18   Dessa Phi, DO  sertraline (ZOLOFT) 50 MG tablet Take 50 mg by mouth daily.    [provider]  sertraline (ZOLOFT) 50 MG tablet Take 50 mg by mouth daily.    [provider]  traZODone (DESYREL) 50 MG tablet Take 1 tablet (50 mg total) by mouth 3 (three) times daily as needed (agitation). 08/04/19   Tegeler, Gwenyth Allegra, MD  vitamin B-12 (CYANOCOBALAMIN) 100 MCG tablet Take 100 mcg by mouth daily.     [provider]    Allergies    Codeine, Doxycycline, Griseofulvin ultramicrosize [griseofulvin], Halcion [triazolam], Oysters [shellfish allergy], and Penicillins  Review of Systems   Review of Systems  Unable to perform ROS: Dementia    Physical Exam Updated Vital Signs BP (!) 174/88   Pulse 68   Temp 99.1 F (37.3 C) (Oral)   Resp 20   SpO2 97%   Physical Exam Vitals and nursing note reviewed.  Constitutional:      General: She is not in acute distress.    Appearance: Normal appearance. She is well-developed and well-nourished. She is not ill-appearing or toxic-appearing.  HENT:     Head: Normocephalic and atraumatic.  Eyes:     Extraocular Movements: Extraocular movements intact.     Conjunctiva/sclera: Conjunctivae normal.     Pupils: Pupils are equal, round, and reactive to light.  Cardiovascular:     Rate and Rhythm: Normal rate and regular rhythm.     Heart sounds: No murmur heard.   Pulmonary:     Effort: Pulmonary effort is normal. No respiratory distress.     Breath sounds: Normal breath sounds.  Abdominal:     General: There is no distension.     Palpations: Abdomen is soft.     Tenderness: There is no abdominal tenderness. There is no guarding.  Musculoskeletal:        General: No edema. Normal range of motion.     Cervical back: Normal range of motion and neck supple.  Skin:    General: Skin is warm and dry.     Capillary Refill: Capillary refill takes less than 2 seconds.  Neurological:     Mental Status: She is alert.     Cranial Nerves: Cranial nerve deficit present.     Sensory: No sensory deficit.     Motor: No weakness.     Comments: Patient with some degree of confusion.  But will talk with slurred speech which seems to be baseline.  Will follow commands.  Patient denies any complaints currently.  Psychiatric:        Mood and Affect: Mood and affect normal.     ED Results / Procedures / Treatments   Labs (all labs ordered  are listed, but only abnormal results are displayed) Labs Reviewed  COMPREHENSIVE METABOLIC PANEL - Abnormal; Notable for the following components:      Result Value   Glucose, Bld 113 (*)    Calcium 11.0 (*)    All other components within normal limits  LIPASE, BLOOD  CBC  URINALYSIS, ROUTINE W REFLEX MICROSCOPIC    EKG EKG Interpretation  Date/Time:  Wednesday May 12 2020 12:43:35 EST Ventricular Rate:  66 PR Interval:    QRS Duration: 76 QT Interval:  398 QTC Calculation:  417 R Axis:   76 Text Interpretation: Sinus rhythm 12 Lead; Mason-Likar Confirmed by Vanetta Mulders [phone]) on 05/12/2020 1:45:07 PM   Radiology No results found.  Procedures Procedures (including critical care time)  Medications Ordered in ED Medications  iohexol (OMNIPAQUE) 300 MG/ML solution 100 mL (has no administration in time range)    ED Course  I have reviewed the triage vital signs and the nursing notes.  Pertinent labs & imaging results that were available during my care of the patient were reviewed by me and considered in my medical decision making (see chart for details).    MDM Rules/Calculators/A&P                          Patient sent in for concerns for confusion as well as left lower quadrant abdominal pain.  Abdominal exam is very nontender.  Will get CT abdomen.  Due to some concerns of mental status changes patient will get CT head.  Patient has a known history of dementia.  In the past did have trouble with low thiamine.  That has been corrected as of late.  CT had CT abdomen without any acute findings.  There was some evidence of pulmonary nodules which will need some follow-up.  Urinalysis questionable for urinary tract infection.  Will go and send culture treat with Keflex.  And continue Keflex for the next 7 days.  Would expect improvement over the next couple days.  If not patient may need reevaluation.   Final Clinical Impression(s) / ED Diagnoses Final  diagnoses:  Left lower quadrant abdominal pain  Confusion    Rx / DC Orders ED Discharge Orders    None       Vanetta Mulders, MD 05/12/20 1535

## 2020-05-12 NOTE — Discharge Instructions (Addendum)
CT scan abdomen without any acute findings.  CT head without any acute changes.  Urinalysis questionable but most likely representing urinary tract infection.  Sent for culture.  Will start on Keflex.  This may explain some of her increased confusion and the abdominal discomfort.  Patient stable for discharge would expect improvement over the next couple days.  CT abdomen to pick up some pulmonary nodules which will require some follow-up with CAT scans in 12 months to make sure that they are not enlarging in size.

## 2020-05-14 LAB — URINE CULTURE

## 2020-05-28 ENCOUNTER — Emergency Department (HOSPITAL_COMMUNITY): Payer: Medicare Other

## 2020-05-28 ENCOUNTER — Emergency Department (HOSPITAL_COMMUNITY)
Admission: EM | Admit: 2020-05-28 | Discharge: 2020-05-28 | Disposition: A | Discharge status: Home / Self Care | Payer: Medicare Other | Attending: Emergency Medicine | Admitting: Emergency Medicine

## 2020-05-28 ENCOUNTER — Other Ambulatory Visit: Payer: Self-pay

## 2020-05-28 DIAGNOSIS — F0390 Unspecified dementia without behavioral disturbance: Secondary | ICD-10-CM | POA: Insufficient documentation

## 2020-05-28 DIAGNOSIS — Z79899 Other long term (current) drug therapy: Secondary | ICD-10-CM | POA: Diagnosis not present

## 2020-05-28 DIAGNOSIS — I10 Essential (primary) hypertension: Secondary | ICD-10-CM | POA: Insufficient documentation

## 2020-05-28 DIAGNOSIS — R410 Disorientation, unspecified: Secondary | ICD-10-CM | POA: Diagnosis present

## 2020-05-28 LAB — CBC WITH DIFFERENTIAL/PLATELET
Abs Immature Granulocytes: 0.03 10*3/uL (ref 0.00–0.07)
Basophils Absolute: 0.1 10*3/uL (ref 0.0–0.1)
Basophils Relative: 1 %
Eosinophils Absolute: 0 10*3/uL (ref 0.0–0.5)
Eosinophils Relative: 0 %
HCT: 41.3 % (ref 36.0–46.0)
Hemoglobin: 13.4 g/dL (ref 12.0–15.0)
Immature Granulocytes: 0 %
Lymphocytes Relative: 9 %
Lymphs Abs: 0.8 10*3/uL (ref 0.7–4.0)
MCH: 30.3 pg (ref 26.0–34.0)
MCHC: 32.4 g/dL (ref 30.0–36.0)
MCV: 93.4 fL (ref 80.0–100.0)
Monocytes Absolute: 0.3 10*3/uL (ref 0.1–1.0)
Monocytes Relative: 3 %
Neutro Abs: 8.1 10*3/uL — ABNORMAL HIGH (ref 1.7–7.7)
Neutrophils Relative %: 87 %
Platelets: 307 10*3/uL (ref 150–400)
RBC: 4.42 MIL/uL (ref 3.87–5.11)
RDW: 13.3 % (ref 11.5–15.5)
WBC: 9.4 10*3/uL (ref 4.0–10.5)
nRBC: 0 % (ref 0.0–0.2)

## 2020-05-28 LAB — COMPREHENSIVE METABOLIC PANEL
ALT: 18 U/L (ref 0–44)
AST: 27 U/L (ref 15–41)
Albumin: 4.3 g/dL (ref 3.5–5.0)
Alkaline Phosphatase: 95 U/L (ref 38–126)
Anion gap: 11 (ref 5–15)
BUN: 19 mg/dL (ref 8–23)
CO2: 22 mmol/L (ref 22–32)
Calcium: 10.3 mg/dL (ref 8.9–10.3)
Chloride: 109 mmol/L (ref 98–111)
Creatinine, Ser: 0.84 mg/dL (ref 0.44–1.00)
GFR, Estimated: >60 mL/min (ref >60)
Glucose, Bld: 104 mg/dL — ABNORMAL HIGH (ref 70–99)
Potassium: 4 mmol/L (ref 3.5–5.1)
Sodium: 142 mmol/L (ref 135–145)
Total Bilirubin: 0.6 mg/dL (ref 0.3–1.2)
Total Protein: 7.7 g/dL (ref 6.5–8.1)

## 2020-05-28 LAB — URINALYSIS, ROUTINE W REFLEX MICROSCOPIC
Bacteria, UA: NONE SEEN
Bilirubin Urine: NEGATIVE
Glucose, UA: NEGATIVE mg/dL
Hgb urine dipstick: NEGATIVE
Ketones, ur: 5 mg/dL — AB
Leukocytes,Ua: NEGATIVE
Nitrite: NEGATIVE
Protein, ur: 30 mg/dL — AB
Specific Gravity, Urine: 1.023 (ref 1.005–1.030)
pH: 5 (ref 5.0–8.0)

## 2020-05-28 NOTE — ED Triage Notes (Signed)
AMS since this morning per staff at [facility]. History of speech impediment.

## 2020-05-28 NOTE — ED Notes (Signed)
[facility] notified of transport needs. Unknown ETA

## 2020-05-28 NOTE — ED Provider Notes (Signed)
[facility] DEPT Provider Note   CSN: [id number] Arrival date & time: 05/28/20  1501     History Chief Complaint  Patient presents with  . Altered Mental Status    Sarah Gutierrez is a 75 y.o. female.  Level 5 caveat secondary to dementia.  She is a resident at [facility].  She is brought in by ambulance for evaluation of altered mental status.  Patient herself has no complaints.  She is not sure why she is here.  Called the facility nurse who said she had diarrhea all night long and today her personality change.  She was sometimes yelling and sometimes laughing.  Some delusions like her legs were broken. Unusual for her.  Speech impediment is baseline.  The history is provided by the patient, the EMS personnel and the nursing home.  Altered Mental Status Presenting symptoms: behavior changes   Severity:  Unable to specify Most recent episode:  Today Episode history:  Single Timing:  Constant Progression:  Unchanged Chronicity:  New Context: nursing home resident, recent change in medication and recent infection   Associated symptoms: no abdominal pain, no fever and no vomiting        Past Medical History:  Diagnosis Date  . Cognitive impairment   . Hypertension     Patient Active Problem List   Diagnosis Date Noted  . Altered mental status 08/02/2019  . 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 Protein-calorie malnutrition, severe 05/29/2018  . Pressure injury of skin 05/29/2018  . AKI (acute kidney injury) ([place])   . Acute metabolic encephalopathy 40/98/1191  . Dehydration 05/20/2018  . Hypercalcemia 05/20/2018  . Hypernatremia 05/20/2018  . Lactic acid acidosis 05/20/2018  . Elevated troponin 05/20/2018  . Hypertension   . Closed displaced fracture of middle phalanx of left ring finger 12/01/2015    No past surgical history on file.   OB History   No obstetric history  on file.     Family History  Problem Relation Age of Onset  . CAD Brother 1       has coronary stent     Social History   Tobacco Use  . Smoking status: Never Smoker  . Smokeless tobacco: Never Used  Vaping Use  . Vaping Use: Never used  Substance Use Topics  . 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 Drug use: Never    Home Medications Prior to Admission medications   Medication Sig Start Date End Date Taking? Authorizing Provider  acetaminophen (TYLENOL) 325 MG tablet Take 650 mg by mouth every 6 (six) hours as needed for mild pain or moderate pain.    [provider]  amLODipine (NORVASC) 2.5 MG tablet Take 2.5 mg by mouth daily. 01/26/20   [provider]  cephALEXin (KEFLEX) 500 MG capsule Take 1 capsule (500 mg total) by mouth 4 (four) times daily. 05/12/20   Fredia Sorrow, MD  donepezil (ARICEPT) 10 MG tablet Take 1/2 tablet daily for 2 weeks, then increase to 1 tablet daily and continue Patient taking differently: Take 10 mg by mouth at bedtime.  10/23/18   Cameron Sprang, MD  Ensure (ENSURE) Take 237 mLs by mouth 3 (three) times daily between meals.    [provider]  loratadine (CLARITIN) 10 MG tablet Take 10 mg by mouth daily as needed for allergies.    [provider]  Multiple Vitamins-Minerals (  MULTIVITAMIN PO) Take 1 tablet by mouth daily.    [provider]  ondansetron (ZOFRAN) 4 MG tablet Take 4 mg by mouth every 8 (eight) hours as needed for nausea or vomiting.    [provider]  polyethylene glycol (MIRALAX / GLYCOLAX) packet Take 17 g by mouth daily. 06/01/18   Dessa Phi, DO  QUEtiapine (SEROQUEL) 25 MG tablet Take 1 tablet (25 mg total) by mouth as needed (May give extra 25 mg tablet once daily as needed for agitation.). Patient taking differently: Take 25 mg by mouth See admin instructions. Take 1 tablet (25mg ) by mouth at bedtime 02/14/19   Charlesetta Shanks, MD   senna-docusate (SENOKOT-S) 8.6-50 MG tablet Take 1 tablet by mouth daily. 06/01/18   Dessa Phi, DO  sertraline (ZOLOFT) 50 MG tablet Take 50 mg by mouth daily.    [provider]  sertraline (ZOLOFT) 50 MG tablet Take 50 mg by mouth daily.    [provider]  traZODone (DESYREL) 50 MG tablet Take 1 tablet (50 mg total) by mouth 3 (three) times daily as needed (agitation). 08/04/19   Tegeler, Gwenyth Allegra, MD  vitamin B-12 (CYANOCOBALAMIN) 100 MCG tablet Take 100 mcg by mouth daily.    [provider]    Allergies    Codeine, Doxycycline, Griseofulvin ultramicrosize [griseofulvin], Halcion [triazolam], Oysters [shellfish allergy], and Penicillins  Review of Systems   Review of Systems  Unable to perform ROS: Dementia  Constitutional: Negative for fever.  Gastrointestinal: Negative for abdominal pain and vomiting.    Physical Exam Updated Vital Signs BP (!) 191/84 (BP Location: Right Arm)   Pulse 85   Temp 97.9 F (36.6 C) (Oral)   Resp 16   SpO2 93%   Physical Exam Vitals and nursing note reviewed.  Constitutional:      General: She is not in acute distress.    Appearance: Normal appearance. She is well-developed and well-nourished.  HENT:     Head: Normocephalic and atraumatic.  Eyes:     Conjunctiva/sclera: Conjunctivae normal.  Cardiovascular:     Rate and Rhythm: Normal rate and regular rhythm.     Heart sounds: No murmur heard.   Pulmonary:     Effort: Pulmonary effort is normal. No respiratory distress.     Breath sounds: Normal breath sounds.  Abdominal:     Palpations: Abdomen is soft.     Tenderness: There is no abdominal tenderness.  Musculoskeletal:        General: No deformity, signs of injury or edema. Normal range of motion.     Cervical back: Neck supple.  Skin:    General: Skin is warm and dry.  Neurological:     General: No focal deficit present.     Mental Status: She is alert.     Comments: Patient moving all  extremities nonfocal he.  She knows she is in the hospital but she is not sure why.  She knows this January but she was not sure of the year.  Psychiatric:        Mood and Affect: Mood and affect normal.     ED Results / Procedures / Treatments   Labs (all labs ordered are listed, but only abnormal results are displayed) Labs Reviewed  COMPREHENSIVE METABOLIC PANEL - Abnormal; Notable for the following components:      Result Value   Glucose, Bld 104 (*)    All other components within normal limits  CBC WITH DIFFERENTIAL/PLATELET - Abnormal; Notable for the  following components:   Neutro Abs 8.1 (*)    All other components within normal limits  URINALYSIS, ROUTINE W REFLEX MICROSCOPIC - Abnormal; Notable for the following components:   APPearance HAZY (*)    Ketones, ur 5 (*)    Protein, ur 30 (*)    All other components within normal limits  URINE CULTURE  GASTROINTESTINAL PANEL BY PCR, STOOL (REPLACES STOOL CULTURE)    EKG EKG Interpretation  Date/Time:  Friday May 28 2020 15:12:40 EST Ventricular Rate:  83 PR Interval:    QRS Duration: 73 QT Interval:  371 QTC Calculation: 436 R Axis:   81 Text Interpretation: Sinus rhythm Borderline right axis deviation No significant change since prior 12/21 Confirmed by Aletta Edouard [phone]) on 05/28/2020 3:30:45 PM   Radiology CT Head Wo Contrast  Result Date: 05/28/2020 CLINICAL DATA:  Altered mental status. EXAM: CT HEAD WITHOUT CONTRAST TECHNIQUE: Contiguous axial images were obtained from the base of the skull through the vertex without intravenous contrast. COMPARISON:  May 12, 2020 FINDINGS: Brain: There is mild cerebral atrophy with widening of the extra-axial spaces and ventricular dilatation. There are areas of decreased attenuation within the white matter tracts of the supratentorial brain, consistent with microvascular disease changes. Vascular: No hyperdense vessel or unexpected calcification. Skull: Normal.  Negative for fracture or focal lesion. Sinuses/Orbits: There is mild sphenoid sinus mucosal thickening. Other: None. IMPRESSION: 1. Generalized cerebral atrophy. 2. No acute intracranial abnormality. Electronically Signed   By: Virgina Norfolk M.D.   On: 05/28/2020 18:12   DG Chest Port 1 View  Result Date: 05/28/2020 CLINICAL DATA:  Altered mental status EXAM: PORTABLE CHEST 1 VIEW COMPARISON:  01/25/2020 FINDINGS: The heart size and mediastinal contours are within normal limits. Both lungs are clear. Chronic left rib fractures. IMPRESSION: No active disease. Electronically Signed   By: Franchot Gallo M.D.   On: 05/28/2020 15:58    Procedures Procedures (including critical care time)  Medications Ordered in ED Medications - No data to display  ED Course  I have reviewed the triage vital signs and the nursing notes.  Pertinent labs & imaging results that were available during my care of the patient were reviewed by me and considered in my medical decision making (see chart for details).  Clinical Course as of 05/29/20 1044  Fri May 28, 2020  1530 Patient was in the ED 2 weeks ago with lower abdominal pain and treated with Keflex for UTI. [MB]  7106 Chest x-ray interpreted by me as no acute infiltrates. [MB]  [id number] Patient has not had any diarrhea here.  Her work-up has been unremarkable including head CT chest x-ray urinalysis and lab work.  Other than being mildly hypertensive she is otherwise been hemodynamically stable.  We will have her return to her facility where they can continue to monitor. [MB]    Clinical Course User Index [MB] Hayden Rasmussen, MD   MDM Rules/Calculators/A&P                         DAMIKA HARMON was evaluated in Emergency Department on 05/28/2020 for the symptoms described in the history of present illness. She was evaluated in the context of the global COVID-19 pandemic, which necessitated consideration that the patient might be at risk for infection with  the SARS-CoV-2 virus that causes COVID-19. Institutional protocols and algorithms that pertain to the evaluation of patients at risk for COVID-19 are in a state of rapid change  based on information released by regulatory bodies including the CDC and federal and state organizations. These policies and algorithms were followed during the patient's care in the ED.  This patient complains of behavior changes, diarrhea; this involves an extensive number of treatment Options and is a complaint that carries with it a high risk of complications and Morbidity. The differential includes encephalopathy, infection metabolic disturbance, stroke, UTI, C. difficile, COVID  I ordered, reviewed and interpreted labs, which included CBC with normal white count normal hemoglobin, chemistries normal, LFTs normal, urinalysis negative stool studies and C. difficile ordered but not obtained as patient did not have any bowel movements while in department I ordered imaging studies which included chest x-ray and head CT and I independently    visualized and interpreted imaging which showed no acute findings Additional history obtained from patient's nurse at facility Previous records obtained and reviewed in epic, no recent admissions  After the interventions stated above, I reevaluated the patient and found patient remained hypertensive but otherwise asymptomatic.  In no distress.  We will have her return to facility where they can continue to monitor her.   Final Clinical Impression(s) / ED Diagnoses Final diagnoses:  Confusion    Rx / DC Orders ED Discharge Orders    None       Hayden Rasmussen, MD 05/29/20 1047

## 2020-05-28 NOTE — Discharge Instructions (Addendum)
You were seen in the emergency department for an evaluation of confusion today.  You had blood work, urinalysis, chest x-ray, and a CAT scan of your head that did not show any acute findings.  We ordered stool studies but you did not have any bowel movement while you were here.  Your doctor may want to get stool studies at the facility.  Please contact them.  Return to the emergency department if any worsening or concerning symptoms

## 2020-05-28 NOTE — ED Notes (Signed)
Pt. At CT.

## 2020-05-30 LAB — URINE CULTURE: Culture: NO GROWTH

## 2020-06-14 ENCOUNTER — Encounter (HOSPITAL_COMMUNITY): Payer: Self-pay

## 2020-06-14 ENCOUNTER — Other Ambulatory Visit: Payer: Self-pay

## 2020-06-14 ENCOUNTER — Emergency Department (HOSPITAL_COMMUNITY)
Admission: EM | Admit: 2020-06-14 | Discharge: 2020-06-14 | Disposition: A | Discharge status: Home / Self Care | Payer: Medicare Other | Attending: Emergency Medicine | Admitting: Emergency Medicine

## 2020-06-14 DIAGNOSIS — F0391 Unspecified dementia with behavioral disturbance: Secondary | ICD-10-CM

## 2020-06-14 DIAGNOSIS — R112 Nausea with vomiting, unspecified: Secondary | ICD-10-CM | POA: Diagnosis not present

## 2020-06-14 DIAGNOSIS — I10 Essential (primary) hypertension: Secondary | ICD-10-CM | POA: Diagnosis not present

## 2020-06-14 DIAGNOSIS — R109 Unspecified abdominal pain: Secondary | ICD-10-CM | POA: Diagnosis not present

## 2020-06-14 DIAGNOSIS — Z79899 Other long term (current) drug therapy: Secondary | ICD-10-CM | POA: Insufficient documentation

## 2020-06-14 DIAGNOSIS — F0390 Unspecified dementia without behavioral disturbance: Secondary | ICD-10-CM | POA: Diagnosis not present

## 2020-06-14 LAB — CBC WITH DIFFERENTIAL/PLATELET
Abs Immature Granulocytes: 0.03 10*3/uL (ref 0.00–0.07)
Basophils Absolute: 0.1 10*3/uL (ref 0.0–0.1)
Basophils Relative: 1 %
Eosinophils Absolute: 0.1 10*3/uL (ref 0.0–0.5)
Eosinophils Relative: 1 %
HCT: 40.6 % (ref 36.0–46.0)
Hemoglobin: 13.1 g/dL (ref 12.0–15.0)
Immature Granulocytes: 1 %
Lymphocytes Relative: 21 %
Lymphs Abs: 1.3 10*3/uL (ref 0.7–4.0)
MCH: 30 pg (ref 26.0–34.0)
MCHC: 32.3 g/dL (ref 30.0–36.0)
MCV: 92.9 fL (ref 80.0–100.0)
Monocytes Absolute: 0.3 10*3/uL (ref 0.1–1.0)
Monocytes Relative: 5 %
Neutro Abs: 4.5 10*3/uL (ref 1.7–7.7)
Neutrophils Relative %: 71 %
Platelets: 337 10*3/uL (ref 150–400)
RBC: 4.37 MIL/uL (ref 3.87–5.11)
RDW: 13.3 % (ref 11.5–15.5)
WBC: 6.2 10*3/uL (ref 4.0–10.5)
nRBC: 0 % (ref 0.0–0.2)

## 2020-06-14 LAB — COMPREHENSIVE METABOLIC PANEL
ALT: 14 U/L (ref 0–44)
AST: 18 U/L (ref 15–41)
Albumin: 4.2 g/dL (ref 3.5–5.0)
Alkaline Phosphatase: 97 U/L (ref 38–126)
Anion gap: 8 (ref 5–15)
BUN: 19 mg/dL (ref 8–23)
CO2: 27 mmol/L (ref 22–32)
Calcium: 10.4 mg/dL — ABNORMAL HIGH (ref 8.9–10.3)
Chloride: 107 mmol/L (ref 98–111)
Creatinine, Ser: 0.7 mg/dL (ref 0.44–1.00)
GFR, Estimated: >60 mL/min (ref >60)
Glucose, Bld: 104 mg/dL — ABNORMAL HIGH (ref 70–99)
Potassium: 3.7 mmol/L (ref 3.5–5.1)
Sodium: 142 mmol/L (ref 135–145)
Total Bilirubin: 0.5 mg/dL (ref 0.3–1.2)
Total Protein: 7.3 g/dL (ref 6.5–8.1)

## 2020-06-14 LAB — URINALYSIS, ROUTINE W REFLEX MICROSCOPIC
Bacteria, UA: NONE SEEN
Bilirubin Urine: NEGATIVE
Glucose, UA: NEGATIVE mg/dL
Ketones, ur: NEGATIVE mg/dL
Leukocytes,Ua: NEGATIVE
Nitrite: NEGATIVE
Protein, ur: NEGATIVE mg/dL
Specific Gravity, Urine: 1.028 (ref 1.005–1.030)
pH: 6 (ref 5.0–8.0)

## 2020-06-14 LAB — LIPASE, BLOOD: Lipase: 29 U/L (ref 11–51)

## 2020-06-14 MED ORDER — LORAZEPAM 0.5 MG PO TABS
0.5000 mg | ORAL_TABLET | Freq: Once | ORAL | Status: AC
  Administered 2020-06-14: 0.5 mg via ORAL
  Filled 2020-06-14: qty 1

## 2020-06-14 NOTE — ED Provider Notes (Signed)
[facility] DEPT Provider Note   CSN: [id number] Arrival date & time: 06/14/20  0845     History Chief Complaint  Patient presents with  . Abdominal Pain    Sarah Gutierrez is a 75 y.o. female.  HPI   According to the EMS report the patient was having difficulty with nausea vomiting and abdominal pain last night.  Symptoms resolved by this morning.  Patient was able to take her medications this morning.  The patient apparently still wanted to come to the ED.  Here in the ED the patient states she feels fine.  She thinks she had trouble because she was not sure where to go to the bathroom.  She denies any nausea or vomiting to me.  She denies any abdominal pain.  She denies fevers or chills.  No urinary difficulty.  Patient does reside at a nursing facility.  She does have history of cognitive impairment so this may be interfering with the history  Past Medical History:  Diagnosis Date  . Cognitive impairment   . Hypertension     Patient Active Problem List   Diagnosis Date Noted  . Altered mental status 08/02/2019  . 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 Protein-calorie malnutrition, severe 05/29/2018  . Pressure injury of skin 05/29/2018  . AKI (acute kidney injury) ([place])   . Acute metabolic encephalopathy [id number]  . Dehydration 05/20/2018  . Hypercalcemia 05/20/2018  . Hypernatremia 05/20/2018  . Lactic acid acidosis 05/20/2018  . Elevated troponin 05/20/2018  . Hypertension   . Closed displaced fracture of middle phalanx of left ring finger 12/01/2015    History reviewed. No pertinent surgical history.   OB History   No obstetric history on file.     Family History  Problem Relation Age of Onset  . CAD Brother 87       has coronary stent     Social History   Tobacco Use  . Smoking status: Never Smoker  . Smokeless tobacco: Never Used  Vaping Use  . Vaping Use: Never used   Substance Use Topics  . 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 Drug use: Never    Home Medications Prior to Admission medications   Medication Sig Start Date End Date Taking? Authorizing Provider  acetaminophen (TYLENOL) 325 MG tablet Take 650 mg by mouth every 6 (six) hours as needed for mild pain or moderate pain.    [provider]  amLODipine (NORVASC) 2.5 MG tablet Take 2.5 mg by mouth daily. 01/26/20   [provider]  cephALEXin (KEFLEX) 500 MG capsule Take 1 capsule (500 mg total) by mouth 4 (four) times daily. Patient not taking: No sig reported 05/12/20   Fredia Sorrow, MD  diphenhydrAMINE (BENADRYL) 2 % cream Apply 1 application topically daily as needed for itching (rash on right palm).    [provider]  donepezil (ARICEPT) 10 MG tablet Take 1/2 tablet daily for 2 weeks, then increase to 1 tablet daily and continue Patient taking differently: Take 10 mg by mouth at bedtime. 10/23/18   Cameron Sprang, MD  loperamide (IMODIUM) 2 MG capsule Take 2 mg by mouth 3 (three) times daily as needed for diarrhea or loose stools.    [provider]  loratadine (CLARITIN) 10 MG tablet Take 10 mg by mouth daily as needed for allergies.    [provider]  Multiple Vitamins-Minerals (MULTIVITAMIN PO)  Take 1 tablet by mouth daily.    [provider]  ondansetron (ZOFRAN) 4 MG tablet Take 4 mg by mouth every 6 (six) hours as needed for nausea or vomiting.    [provider]  polyethylene glycol (MIRALAX / GLYCOLAX) packet Take 17 g by mouth daily. 06/01/18   Dessa Phi, DO  pravastatin (PRAVACHOL) 10 MG tablet Take 10 mg by mouth every evening.    [provider]  QUEtiapine (SEROQUEL) 25 MG tablet Take 1 tablet (25 mg total) by mouth as needed (May give extra 25 mg tablet once daily as needed for agitation.). Patient taking differently: Take 25 mg by mouth See admin instructions. Take 1  tablet (25mg ) by mouth at bedtime 02/14/19   Charlesetta Shanks, MD  senna-docusate (SENOKOT-S) 8.6-50 MG tablet Take 1 tablet by mouth daily. Patient taking differently: Take 2 tablets by mouth at bedtime. 06/01/18   Dessa Phi, DO  sertraline (ZOLOFT) 50 MG tablet Take 50 mg by mouth daily.    [provider]  thiamine 100 MG tablet Take 100 mg by mouth daily.    [provider]  traZODone (DESYREL) 50 MG tablet Take 1 tablet (50 mg total) by mouth 3 (three) times daily as needed (agitation). 08/04/19   Tegeler, Gwenyth Allegra, MD    Allergies    Codeine, Doxycycline, Griseofulvin ultramicrosize [griseofulvin], Halcion [triazolam], Oysters [shellfish allergy], and Penicillins  Review of Systems   Review of Systems  All other systems reviewed and are negative.   Physical Exam Updated Vital Signs BP (!) 170/55   Pulse 65   Temp 97.8 F (36.6 C) (Oral)   Resp 15   Ht 1.727 m (5\' 8" )   Wt 55 kg   SpO2 96%   BMI 18.44 kg/m   Physical Exam Vitals and nursing note reviewed.  Constitutional:      Appearance: She is well-developed and well-nourished. She is not ill-appearing or diaphoretic.  HENT:     Head: Normocephalic and atraumatic.     Right Ear: External ear normal.     Left Ear: External ear normal.  Eyes:     General: No scleral icterus.       Right eye: No discharge.        Left eye: No discharge.     Conjunctiva/sclera: Conjunctivae normal.  Neck:     Trachea: No tracheal deviation.  Cardiovascular:     Rate and Rhythm: Normal rate and regular rhythm.     Pulses: Intact distal pulses.  Pulmonary:     Effort: Pulmonary effort is normal. No respiratory distress.     Breath sounds: Normal breath sounds. No stridor. No wheezing or rales.  Abdominal:     General: Bowel sounds are normal. There is no distension.     Palpations: Abdomen is soft.     Tenderness: There is no abdominal tenderness. There is no guarding or rebound.     Hernia: No hernia is  present.  Musculoskeletal:        General: No tenderness or edema.     Cervical back: Neck supple.  Skin:    General: Skin is warm and dry.     Findings: No rash.  Neurological:     Mental Status: She is alert.     Cranial Nerves: No cranial nerve deficit (no facial droop, extraocular movements intact, speech slow, dysarthria).     Sensory: No sensory deficit.     Motor: No abnormal muscle tone or seizure activity.  Coordination: Coordination normal.     Deep Tendon Reflexes: Strength normal.     Comments: Answers appropriately, follows commands, no focal deficits,   Psychiatric:        Mood and Affect: Mood and affect normal.     ED Results / Procedures / Treatments   Labs (all labs ordered are listed, but only abnormal results are displayed) Labs Reviewed  COMPREHENSIVE METABOLIC PANEL - Abnormal; Notable for the following components:      Result Value   Glucose, Bld 104 (*)    Calcium 10.4 (*)    All other components within normal limits  URINALYSIS, ROUTINE W REFLEX MICROSCOPIC - Abnormal; Notable for the following components:   Color, Urine STRAW (*)    Hgb urine dipstick SMALL (*)    All other components within normal limits  LIPASE, BLOOD  CBC WITH DIFFERENTIAL/PLATELET    EKG None  Radiology No results found.  Procedures Procedures   Medications Ordered in ED Medications - No data to display  ED Course  I have reviewed the triage vital signs and the nursing notes.  Pertinent labs & imaging results that were available during my care of the patient were reviewed by me and considered in my medical decision making (see chart for details).  Clinical Course as of 06/14/20 1139  Mon Jun 14, 2020  1137 Labs reviewed.  Urinalysis normal.  CBC metabolic panel and lipase unremarkable. [JK]  3235 Patient did become upset while waiting in the ED.  I was able to speak with the patient and reassure her.  I explained to her that she was in the emergency department  and we were seeing her for her abdominal pain.  She was upset about her mother.  Patient reassured [JK]    Clinical Course User Index [JK] Dorie Rank, MD   MDM Rules/Calculators/A&P                          Patient presented to ED with an episode of abdominal pain.  History somewhat difficult as the patient does have some dementia and cognitive disabilities.  She denies having any abdominal pain during my exam.  Her abdominal exam is benign.  Her laboratory tests are unremarkable.  No signs to suggest acute infection, dehydration, UTI ureteral stone.  Signs of pancreatitis on the laboratory test.  At this time the patient appears comfortable and I do not feel that further evaluation is necessary.  She does appear stable for discharge back to the nursing facility. Final Clinical Impression(s) / ED Diagnoses Final diagnoses:  Abdominal pain, unspecified abdominal location  Dementia without behavioral disturbance, unspecified dementia type [facility])    Rx / DC Orders ED Discharge Orders    None       Dorie Rank, MD 06/14/20 1139

## 2020-06-14 NOTE — Discharge Instructions (Addendum)
The test today in the emergency room were reassuring.  No signs of infection, liver pancreas or kidney problems.  Continue your current medications at home.  Return as needed for worsening symptoms

## 2020-06-14 NOTE — ED Triage Notes (Signed)
C/o n/v abdominal pain last night. No complaints this morning, took scheduled bp meds prior to EMS call.Staff medicated but pt persistent about coming to ED. Hx of dementia resides at [facility]

## 2020-06-14 NOTE — ED Notes (Signed)
Pt very agitated. Pt states "we are trying to kill her." Pt attempting to pull out IV, mittens were placed on pt for safety.

## 2020-07-22 ENCOUNTER — Emergency Department (HOSPITAL_COMMUNITY)
Admission: EM | Admit: 2020-07-22 | Discharge: 2020-07-22 | Disposition: A | Discharge status: Home / Self Care | Payer: Medicare Other | Attending: Emergency Medicine | Admitting: Emergency Medicine

## 2020-07-22 ENCOUNTER — Other Ambulatory Visit: Payer: Self-pay

## 2020-07-22 ENCOUNTER — Encounter (HOSPITAL_COMMUNITY): Payer: Self-pay

## 2020-07-22 ENCOUNTER — Emergency Department (HOSPITAL_COMMUNITY): Payer: Medicare Other

## 2020-07-22 DIAGNOSIS — R1032 Left lower quadrant pain: Secondary | ICD-10-CM | POA: Diagnosis present

## 2020-07-22 DIAGNOSIS — R1310 Dysphagia, unspecified: Secondary | ICD-10-CM | POA: Diagnosis not present

## 2020-07-22 DIAGNOSIS — F0390 Unspecified dementia without behavioral disturbance: Secondary | ICD-10-CM | POA: Insufficient documentation

## 2020-07-22 DIAGNOSIS — I10 Essential (primary) hypertension: Secondary | ICD-10-CM | POA: Insufficient documentation

## 2020-07-22 DIAGNOSIS — Z79899 Other long term (current) drug therapy: Secondary | ICD-10-CM | POA: Diagnosis not present

## 2020-07-22 DIAGNOSIS — R1084 Generalized abdominal pain: Secondary | ICD-10-CM

## 2020-07-22 DIAGNOSIS — K5900 Constipation, unspecified: Secondary | ICD-10-CM | POA: Insufficient documentation

## 2020-07-22 HISTORY — DX: Encephalopathy, unspecified: G93.40

## 2020-07-22 LAB — URINALYSIS, ROUTINE W REFLEX MICROSCOPIC
Bilirubin Urine: NEGATIVE
Glucose, UA: NEGATIVE mg/dL
Hgb urine dipstick: NEGATIVE
Ketones, ur: NEGATIVE mg/dL
Leukocytes,Ua: NEGATIVE
Nitrite: NEGATIVE
Protein, ur: NEGATIVE mg/dL
Specific Gravity, Urine: 1.027 (ref 1.005–1.030)
pH: 5 (ref 5.0–8.0)

## 2020-07-22 LAB — CBC WITH DIFFERENTIAL/PLATELET
Abs Immature Granulocytes: 0.02 10*3/uL (ref 0.00–0.07)
Basophils Absolute: 0.1 10*3/uL (ref 0.0–0.1)
Basophils Relative: 1 %
Eosinophils Absolute: 0.1 10*3/uL (ref 0.0–0.5)
Eosinophils Relative: 2 %
HCT: 42.5 % (ref 36.0–46.0)
Hemoglobin: 13.8 g/dL (ref 12.0–15.0)
Immature Granulocytes: 0 %
Lymphocytes Relative: 25 %
Lymphs Abs: 1.4 10*3/uL (ref 0.7–4.0)
MCH: 30.3 pg (ref 26.0–34.0)
MCHC: 32.5 g/dL (ref 30.0–36.0)
MCV: 93.2 fL (ref 80.0–100.0)
Monocytes Absolute: 0.3 10*3/uL (ref 0.1–1.0)
Monocytes Relative: 6 %
Neutro Abs: 3.7 10*3/uL (ref 1.7–7.7)
Neutrophils Relative %: 66 %
Platelets: 263 10*3/uL (ref 150–400)
RBC: 4.56 MIL/uL (ref 3.87–5.11)
RDW: 13 % (ref 11.5–15.5)
WBC: 5.5 10*3/uL (ref 4.0–10.5)
nRBC: 0 % (ref 0.0–0.2)

## 2020-07-22 LAB — COMPREHENSIVE METABOLIC PANEL
ALT: 10 U/L (ref 0–44)
AST: 21 U/L (ref 15–41)
Albumin: 3.9 g/dL (ref 3.5–5.0)
Alkaline Phosphatase: 83 U/L (ref 38–126)
Anion gap: 7 (ref 5–15)
BUN: 23 mg/dL (ref 8–23)
CO2: 27 mmol/L (ref 22–32)
Calcium: 10.3 mg/dL (ref 8.9–10.3)
Chloride: 107 mmol/L (ref 98–111)
Creatinine, Ser: 0.85 mg/dL (ref 0.44–1.00)
GFR, Estimated: >60 mL/min (ref >60)
Glucose, Bld: 97 mg/dL (ref 70–99)
Potassium: 4.2 mmol/L (ref 3.5–5.1)
Sodium: 141 mmol/L (ref 135–145)
Total Bilirubin: 0.7 mg/dL (ref 0.3–1.2)
Total Protein: 7 g/dL (ref 6.5–8.1)

## 2020-07-22 LAB — LIPASE, BLOOD: Lipase: 33 U/L (ref 11–51)

## 2020-07-22 MED ORDER — IOHEXOL 300 MG/ML  SOLN
100.0000 mL | Freq: Once | INTRAMUSCULAR | Status: AC | PRN
  Administered 2020-07-22: 100 mL via INTRAVENOUS

## 2020-07-22 MED ORDER — MILK AND MOLASSES ENEMA
1.0000 | Freq: Once | RECTAL | Status: AC
  Administered 2020-07-22: 240 mL via RECTAL
  Filled 2020-07-22 (×2): qty 240

## 2020-07-22 NOTE — ED Notes (Signed)
Called Brother to provided updates.

## 2020-07-22 NOTE — ED Provider Notes (Signed)
[facility] DEPT Provider Note   CSN: [id number] Arrival date & time: 07/22/20  1016     History Chief Complaint  Patient presents with  . Abdominal Pain   LEVEL 5 CAVEAT - COGNITIVE IMPAIRMENT  Sarah Gutierrez is a 75 y.o. female with PMHx HTN, metabolic encephalopathy, and cognitive impairment who presents to the ED today from [facility] with complaint of lower abdominal pain. Additional information obtained by SNF - pt began complaining of abdominal pain this morning and when they pressed on her abdomen she screamed out in pain. They denies any vomiting or diarrhea. They do mention pt had difficulty swallowing her pills this morning which is atypical of her however did not notice any other focal neuro deficits. They were unsure if this was related to her abdominal pain.   The history is provided by the patient, medical records, the nursing home and the EMS personnel.       Past Medical History:  Diagnosis Date  . Cognitive impairment   . Encephalopathy   . Hypertension     Patient Active Problem List   Diagnosis Date Noted  . Altered mental status 08/02/2019  . 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 Protein-calorie malnutrition, severe 05/29/2018  . Pressure injury of skin 05/29/2018  . AKI (acute kidney injury) ([place])   . Acute metabolic encephalopathy 68/07/2120  . Dehydration 05/20/2018  . Hypercalcemia 05/20/2018  . Hypernatremia 05/20/2018  . Lactic acid acidosis 05/20/2018  . Elevated troponin 05/20/2018  . Hypertension   . Closed displaced fracture of middle phalanx of left ring finger 12/01/2015    History reviewed. No pertinent surgical history.   OB History   No obstetric history on file.     Family History  Problem Relation Age of Onset  . CAD Brother 7       has coronary stent     Social History   Tobacco Use  . Smoking status: Never Smoker  . Smokeless tobacco:  Never Used  Vaping Use  . Vaping Use: Never used  Substance Use Topics  . 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 Drug use: Never    Home Medications Prior to Admission medications   Medication Sig Start Date End Date Taking? Authorizing Provider  acetaminophen (TYLENOL) 325 MG tablet Take 650 mg by mouth every 6 (six) hours as needed for mild pain or moderate pain.    [provider]  amLODipine (NORVASC) 2.5 MG tablet Take 2.5 mg by mouth daily. 01/26/20   [provider]  cephALEXin (KEFLEX) 500 MG capsule Take 1 capsule (500 mg total) by mouth 4 (four) times daily. Patient not taking: No sig reported 05/12/20   Fredia Sorrow, MD  diphenhydrAMINE (BENADRYL) 2 % cream Apply 1 application topically daily as needed for itching (rash on right palm).    [provider]  donepezil (ARICEPT) 10 MG tablet Take 1/2 tablet daily for 2 weeks, then increase to 1 tablet daily and continue Patient taking differently: Take 10 mg by mouth at bedtime. 10/23/18   Cameron Sprang, MD  loperamide (IMODIUM) 2 MG capsule Take 2 mg by mouth 3 (three) times daily as needed for diarrhea or loose stools.    [provider]  loratadine (CLARITIN) 10 MG tablet Take 10 mg by mouth daily as needed for allergies.    [provider]  Multiple Vitamins-Minerals (MULTIVITAMIN PO) Take 1  tablet by mouth daily.    [provider]  ondansetron (ZOFRAN) 4 MG tablet Take 4 mg by mouth every 6 (six) hours as needed for nausea or vomiting.    [provider]  polyethylene glycol (MIRALAX / GLYCOLAX) packet Take 17 g by mouth daily. 06/01/18   Dessa Phi, DO  pravastatin (PRAVACHOL) 10 MG tablet Take 10 mg by mouth every evening.    [provider]  QUEtiapine (SEROQUEL) 25 MG tablet Take 1 tablet (25 mg total) by mouth as needed (May give extra 25 mg tablet once daily as needed for agitation.). Patient taking differently:  Take 25 mg by mouth See admin instructions. Take 1 tablet (25mg ) by mouth at bedtime 02/14/19   Charlesetta Shanks, MD  senna-docusate (SENOKOT-S) 8.6-50 MG tablet Take 1 tablet by mouth daily. Patient taking differently: Take 2 tablets by mouth at bedtime. 06/01/18   Dessa Phi, DO  sertraline (ZOLOFT) 50 MG tablet Take 50 mg by mouth daily.    [provider]  thiamine 100 MG tablet Take 100 mg by mouth daily.    [provider]  traZODone (DESYREL) 50 MG tablet Take 1 tablet (50 mg total) by mouth 3 (three) times daily as needed (agitation). 08/04/19   Tegeler, Gwenyth Allegra, MD    Allergies    Jeanie Cooks allergy]  Review of Systems   Review of Systems  Unable to perform ROS: Dementia  Constitutional: Negative for fever.  HENT: Positive for trouble swallowing.   Gastrointestinal: Positive for abdominal pain. Negative for diarrhea, nausea and vomiting.    Physical Exam Updated Vital Signs BP (!) 149/61 (BP Location: Right Arm)   Pulse (!) 48   Temp (!) 97.4 F (36.3 C) (Oral)   Resp 16   Ht 5\' 8"  (1.727 m)   Wt 55 kg   SpO2 98%   BMI 18.44 kg/m   Physical Exam Vitals and nursing note reviewed.  Constitutional:      Appearance: She is not ill-appearing.  HENT:     Head: Normocephalic and atraumatic.     Comments: Tolerating secretions without difficulty Eyes:     Conjunctiva/sclera: Conjunctivae normal.  Cardiovascular:     Rate and Rhythm: Normal rate and regular rhythm.     Heart sounds: Normal heart sounds.  Pulmonary:     Effort: Pulmonary effort is normal.     Breath sounds: Normal breath sounds. No wheezing, rhonchi or rales.  Abdominal:     General: Abdomen is flat. Bowel sounds are normal.     Palpations: Abdomen is soft.     Tenderness: There is abdominal tenderness in the left lower quadrant. There is no guarding or rebound.  Musculoskeletal:     Cervical back: Neck supple.  Skin:    General: Skin is warm and dry.   Neurological:     Mental Status: She is alert.     Comments: At baseline     ED Results / Procedures / Treatments   Labs (all labs ordered are listed, but only abnormal results are displayed) Labs Reviewed  COMPREHENSIVE METABOLIC PANEL  LIPASE, BLOOD  CBC WITH DIFFERENTIAL/PLATELET  URINALYSIS, ROUTINE W REFLEX MICROSCOPIC    EKG None  Radiology CT Abdomen Pelvis W Contrast  Result Date: 07/22/2020 CLINICAL DATA:  Concern for diverticulitis.  Abdominal pain. EXAM: CT ABDOMEN AND PELVIS WITH CONTRAST TECHNIQUE: Multidetector CT imaging of the abdomen and pelvis was performed using the standard protocol following bolus administration of intravenous contrast. CONTRAST:  150mL  OMNIPAQUE IOHEXOL 300 MG/ML  SOLN COMPARISON:  None. FINDINGS: Lower chest: Lung bases are clear. Hepatobiliary: No focal hepatic lesion. No biliary duct dilatation. Common bile duct is normal. Pancreas: Pancreas is normal. No ductal dilatation. No pancreatic inflammation. Spleen: Normal spleen Adrenals/urinary tract: Adrenal glands and kidneys are normal. The ureters and bladder normal. Stomach/Bowel: Stomach, small-bowel and cecum are normal. Again demonstrated fatty lesion (lipoma) within the second portion of the duodenum unchanged from prior. The appendix is not identified but there is no pericecal inflammation to suggest appendicitis. Well-circumscribed fat density lesion within lumen of the colon at the hepatic flexure measuring 12 mm is unchanged from comparison CT. Scattered diverticula descending colon without acute inflammation. Moderate size stool ball in the rectum measures 57 mm. Vascular/Lymphatic: Abdominal aorta is normal caliber with atherosclerotic calcification. There is no retroperitoneal or periportal lymphadenopathy. No pelvic lymphadenopathy. Reproductive: Uterus is unchanged. Lobular enhancement through the fundus of the uterus suggest leiomyoma. Ovaries are unremarkable. Other: No free fluid.  Musculoskeletal: No aggressive osseous lesion. IMPRESSION: 1. No evidence acute diverticulitis. 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 2. Moderate size stool ball in the rectum. 3. Benign-appearing duodenal lipoma. 4. Aortic Atherosclerosis (ICD10-I70.0). Electronically Signed   By: Suzy Bouchard M.D.   On: 07/22/2020 13:39    Procedures Procedures   Medications Ordered in ED Medications  iohexol (OMNIPAQUE) 300 MG/ML solution 100 mL (100 mLs Intravenous Contrast Given 07/22/20 1240)  milk and molasses enema (240 mLs Rectal Given 07/22/20 1452)    ED Course  I have reviewed the triage vital signs and the nursing notes.  Pertinent labs & imaging results that were available during my care of the patient were reviewed by me and considered in my medical decision making (see chart for details).    MDM Rules/Calculators/A&P                          75 year old female with a past medical history of cognitive impairment from nursing facility presents to the ED today after complaining of abdominal pain at nursing facility and found to be tender to palpation in her abdomen.  No nausea, vomiting, diarrhea.  Appears that she was having difficulty swallowing pills this morning which is atypical for her.  She is at baseline per facility otherwise no focal neuro deficits appreciated.  On arrival to the ED vitals are stable.  Patient is afebrile, nontachycardic, nontachypneic.  He appears to be in no acute distress.  She is noted to have left lower quadrant abdominal tenderness palpation on exam.  We will plan for lab work at this time, will plan for CT scan.  Will obtain a swallow screen by nursing staff to see if patient is able to swallow, she is tolerating her own secretions without difficulty.   CBC without leukocytosis. Hgb stable at 13.8 CMP without electrolyte abnormalities. LFTs unremarkable.  Lipase 33 U/A without infection  CT scan with findings of stool ball; rectal exam performed  with chaperone present; unable to palpate stool in the colon and it appears too far up the colon to attempt disimpaction. Will provide enema and reassess.   Pt had large  BM after milk of molasses enema. She has also been able to eat and drink without difficulty. Will discharge back to facility at this time with instructions for miralax daily.   This note was prepared using Dragon voice recognition software and may include unintentional dictation errors due to the inherent limitations of  voice recognition software.   Final Clinical Impression(s) / ED Diagnoses Final diagnoses:  Generalized abdominal pain  Constipation, unspecified constipation type    Rx / DC Orders ED Discharge Orders    None       Discharge Instructions     The CT scan showed a large stool ball that was likely the cause of the abdominal pain today. We have provided an enema with good relief. Pt has also been able to eat and drink without difficulty. I would recommend Miralax on a daily basis to help with constipation.   Return to the ED for any worsening symptoms       Eustaquio Maize, Hershal Coria 07/22/20 1620    Breck Coons, MD 07/23/20 [phone]

## 2020-07-22 NOTE — ED Notes (Signed)
[facility] called and this RN spoke to [place]. She is aware of patients departure from [facility] ED by [facility].

## 2020-07-22 NOTE — ED Triage Notes (Signed)
BIB EMS from [facility] with lower abd pain with palpitation since this am. Denies N/V/D

## 2020-07-22 NOTE — ED Notes (Signed)
Patient provided water and peaches. Pt tolerated well besides with a straw. Cough after using straw

## 2020-07-22 NOTE — Discharge Instructions (Addendum)
The CT scan showed a large stool ball that was likely the cause of the abdominal pain today. We have provided an enema with good relief. Pt has also been able to eat and drink without difficulty. I would recommend Miralax on a daily basis to help with constipation.   Return to the ED for any worsening symptoms

## 2020-07-22 NOTE — ED Notes (Signed)
Attempted to call brother Harvel Quale with no answer. VM left

## 2020-07-22 NOTE — ED Notes (Signed)
[facility] called  

## 2020-07-22 NOTE — ED Notes (Signed)
Updates provided to brother

## 2020-08-09 ENCOUNTER — Other Ambulatory Visit (HOSPITAL_COMMUNITY): Payer: Self-pay | Admitting: Pharmacy

## 2020-08-09 ENCOUNTER — Other Ambulatory Visit: Payer: Self-pay | Admitting: Pharmacy

## 2020-08-09 DIAGNOSIS — R1310 Dysphagia, unspecified: Secondary | ICD-10-CM

## 2020-08-17 ENCOUNTER — Encounter (HOSPITAL_COMMUNITY): Payer: Self-pay

## 2020-08-17 ENCOUNTER — Other Ambulatory Visit: Payer: Self-pay

## 2020-08-17 ENCOUNTER — Ambulatory Visit (HOSPITAL_COMMUNITY)
Admission: RE | Admit: 2020-08-17 | Discharge: 2020-08-17 | Disposition: A | Discharge status: Home / Self Care | Payer: Medicare Other | Source: Ambulatory Visit | Attending: Student | Admitting: Student

## 2020-08-17 ENCOUNTER — Ambulatory Visit (HOSPITAL_COMMUNITY): Payer: Medicare Other

## 2020-08-17 ENCOUNTER — Ambulatory Visit (HOSPITAL_COMMUNITY)
Admission: RE | Admit: 2020-08-17 | Discharge: 2020-08-17 | Disposition: A | Discharge status: Home / Self Care | Payer: Medicare Other | Source: Ambulatory Visit | Attending: Emergency Medicine | Admitting: Emergency Medicine

## 2020-08-17 ENCOUNTER — Other Ambulatory Visit (HOSPITAL_COMMUNITY): Payer: Self-pay

## 2020-08-17 DIAGNOSIS — R1310 Dysphagia, unspecified: Secondary | ICD-10-CM

## 2020-08-17 DIAGNOSIS — R059 Cough, unspecified: Secondary | ICD-10-CM | POA: Insufficient documentation

## 2020-09-17 ENCOUNTER — Other Ambulatory Visit: Payer: Self-pay | Admitting: Gastroenterology

## 2020-09-21 ENCOUNTER — Ambulatory Visit (INDEPENDENT_AMBULATORY_CARE_PROVIDER_SITE_OTHER): Payer: Medicare Other | Admitting: Neurology

## 2020-09-21 ENCOUNTER — Other Ambulatory Visit: Payer: Self-pay

## 2020-09-21 ENCOUNTER — Encounter: Payer: Self-pay | Admitting: Neurology

## 2020-09-21 VITALS — BP 171/76 | HR 72 | Resp 20 | Ht 66.0 in | Wt 115.0 lb

## 2020-09-21 DIAGNOSIS — R471 Dysarthria and anarthria: Secondary | ICD-10-CM

## 2020-09-21 DIAGNOSIS — F0391 Unspecified dementia with behavioral disturbance: Secondary | ICD-10-CM

## 2020-09-21 DIAGNOSIS — F03B18 Unspecified dementia, moderate, with other behavioral disturbance: Secondary | ICD-10-CM

## 2020-09-21 NOTE — Patient Instructions (Addendum)
Good to see you.  1. Schedule MRI brain without contrast  2. Continue all your medications  3. Follow-up in 6 months, call for any changes

## 2020-09-21 NOTE — Progress Notes (Signed)
NEUROLOGY FOLLOW UP OFFICE NOTE  Sarah Gutierrez [id number] 1946-01-05  HISTORY OF PRESENT ILLNESS: I had the pleasure of seeing Sarah Gutierrez in follow-up in the neurology clinic on 09/21/2020.  The patient was last seen 8 months ago for dementia. She is again accompanied by her brother Dr. Orland Mustard who helps supplement the history today.  Records and images were personally reviewed where available.  Since her last visit, she has been to the ER for GI complaints. She was in the ER last 05/2020 for altered mental status after a bout of diarrhea all night long the night prior. She was yelling, laughing, with delusions that her legs were broken. Infectious workup was unremarkable and she was discharged back to [facility]. She had a barium swallow last month which showed mild narrowing of the proximal third of the esophagus, which could be a source for symptoms if she does not adequately masticate prior to ingestion of solid foods, normal motility. At the beginning of the visit today, she could not hear well and was noted to be more dysarthric. Dr. Orland Mustard reports he was with her 5 days ago for another doctor's appointment and there has been a change with her speech and hearing. She was able to hear him well last Thursday. With use of assistive hearing device, she was able to hear better. She has baseline dysarthria but notably worse today. Dr. Orland Mustard reports that aside from the ER visits, she has overall been stable with mood. He found out that after one of the ER visits, Trazodone prescription was inadvertently changed to three times a day instead of prn for agitation. This was discontinued on 09/15/20 and back to prn. She continues on thiamine 100mg  daily, Donepezil 10mg  daily and Sertraline 50mg  daily, and Seroquel 25mg  qhs without side effects. She denies any headaches, dizziness, vision changes. 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    History on Initial Assessment 07/31/2018: This is a pleasant 75 year old  right-handed woman with a history of hypertension, remote alcohol abuse, presenting for evaluation of continued mental status changes. Records from her hospitalization and report from her brother were reviewed, this appears to be quite an acute change for her. She had been living alone independently with family checking in on regularly. She apparently did not answer phone calls since Christmas so her brother called law enforcement on 05/20/2018 for a wellness check. They found her on the couch, non-verbal, following only basic commands. There was half a can of beer without other alcohol noted. She was non-verbal and not following commands in the ER.  Bloodwork showed sodium of 155, calcium 12, WBC 10.8, lactic acid 5.33, negative UA, EtOH level <10, ammonia 20, CD 53, BUN 58, creatinine 1.68. Head CT no acute changes. B1 level was low 28.3. 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 She was discharged to rehab mid-January then moved to [facility] SN[facility] because she still has not returned to baseline. Her brother provided additional information that she had been sober for many years but family feels she has recently started drinking beer again within the past 6 months. Her brother found a case of beer in the pantry and found 4-6 empty cans in her living room. Prior to hospitalization, she was cognitively pretty good but not totally normal. She has a Oceanographer in Nutritional therapist. As far as they knew,  she had been up to date with bill payments, very active walking 3 miles daily, driving, doing all her yardwork. She has never been very social. Now she does not know what the year or day of the week is, and confabulates quite a bit. He feels she may have had mild confabulation prior. She is aware that she cannot come up with the  right words.  He notes she has some nystagmus and tremor that is new, and difficulty with gait and balance. He has not noticed any personality changes, she is very pleasant.   During her visit, she appears to answer questions appropriately but has word-finding difficulties and slow to respond, having to think about her answers. She reports occasional headaches mostly behind her left eye, sometimes affecting her vision. She reports vertical diplopia. She has dizziness when standing. She has numbness over her left wrist. She denies any bowel/bladder incontinence, but family reports she wears Depends. There have been 2 instances where they came to [facility] to find her caked with feces, last week she was on the floor covered with feces (but with a clean diaper). She was previously on Ativan TID, this was discontinued 3/6 but she has been receiving prn Ativan once a day for agitation and restlessness, wanting to get up. Family has not seen the agitation that SNF describes. They report she is not sleeping and wanting to get up. She is very unsteady, PT has recommended wheelchair only, she would fall back when standing. Family denies any hallucinations. He reports an 11-lb weight loss at the SNF because she was refusing food, after family intervened, this has improved. Strength is better than 6 weeks ago.   PAST MEDICAL HISTORY: Past Medical History:  Diagnosis Date  . Cognitive impairment   . Encephalopathy   . Hypertension     MEDICATIONS: Current Outpatient Medications on File Prior to Visit  Medication Sig Dispense Refill  . acetaminophen (TYLENOL) 325 MG tablet Take 650 mg by mouth every 6 (six) hours as needed for mild pain or moderate pain.    Marland Kitchen amLODipine (NORVASC) 2.5 MG tablet Take 2.5 mg by mouth daily.    . cephALEXin (KEFLEX) 500 MG capsule Take 1 capsule (500 mg total) by mouth 4 (four) times daily. (Patient not taking: No sig reported) 28 capsule 0  . diphenhydrAMINE (BENADRYL) 2 % cream  Apply 1 application topically daily as needed for itching (rash on right palm).    Marland Kitchen donepezil (ARICEPT) 10 MG tablet Take 1/2 tablet daily for 2 weeks, then increase to 1 tablet daily and continue (Patient taking differently: Take 10 mg by mouth at bedtime.) 30 tablet 11  . loperamide (IMODIUM) 2 MG capsule Take 2 mg by mouth 3 (three) times daily as needed for diarrhea or loose stools.    Marland Kitchen loratadine (CLARITIN) 10 MG tablet Take 10 mg by mouth daily as needed for allergies.    . Multiple Vitamins-Minerals (MULTIVITAMIN PO) Take 1 tablet by mouth daily.    . ondansetron (ZOFRAN) 4 MG tablet Take 4 mg by mouth every 6 (six) hours as needed for nausea or vomiting.    . polyethylene glycol (MIRALAX / GLYCOLAX) packet Take 17 g by mouth daily. 14 each 0  . pravastatin (PRAVACHOL) 10 MG tablet Take 10 mg by mouth every evening.    Marland Kitchen QUEtiapine (SEROQUEL) 25 MG tablet Take 1 tablet (25 mg total) by mouth as needed (May give extra 25 mg tablet once daily as needed for agitation.). (Patient taking  differently: Take 25 mg by mouth See admin instructions. Take 1 tablet (25mg ) by mouth at bedtime) 30 tablet 0  . senna-docusate (SENOKOT-S) 8.6-50 MG tablet Take 1 tablet by mouth daily. (Patient taking differently: Take 2 tablets by mouth at bedtime.) 30 tablet 0  . sertraline (ZOLOFT) 50 MG tablet Take 50 mg by mouth daily.    Marland Kitchen thiamine 100 MG tablet Take 100 mg by mouth daily.    . traZODone (DESYREL) 50 MG tablet Take 1 tablet (50 mg total) by mouth 3 (three) times daily as needed (agitation). 30 tablet 0   No current facility-administered medications on file prior to visit.    ALLERGIES: Allergies  Allergen Reactions  . Oysters [Shellfish Allergy] Nausea And Vomiting    FAMILY HISTORY: Family History  Problem Relation Age of Onset  . CAD Brother 38       has coronary stent     SOCIAL HISTORY: Social History   Socioeconomic History  . Marital status: Widowed    Spouse name: Not on file   . Number of children: Not on file  . Years of education: Not on file  . Highest education level: Not on file  Occupational History  . Not on file  Tobacco Use  . Smoking status: Never Smoker  . Smokeless tobacco: Never Used  Vaping Use  . Vaping Use: Never used  Substance and Sexual Activity  . 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 Drug use: Never  . Sexual activity: Not Currently  Other Topics Concern  . 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 She is more dysarthric today, less fluent compared to prior visit. Fund of knowledge is reduced. Recent and remote memory are impaired. Attention and concentration are normal.  Cranial nerves: Pupils equal, round. Extraocular movements intact with no nystagmus. Visual fields full. No facial asymmetry.  Motor: Bulk and tone normal, muscle strength 5/5 throughout with no pronator drift. +ataxia on finger to nose bilaterally. Gait not tested.   IMPRESSION: This is a pleasant 75 yo RH woman with a history of  history of hypertension, remote alcohol abuse who had resumed alcohol intake, with Korsakoff syndrome. She initially presented with acute mental status changes in January 2020 with nystagmus and gait ataxia. Thiamine level low on admission, constellation of  symptoms strongly suggestive of Wernicke encephalopathy. There has been a change in her symptoms over the past 5 days, with more dysarthria and hearing loss. No focal weakness. MRI brain without contrast will be ordered to assess for underlying structural abnormality. Mood has been overall stable on current regimen, continue Donepezil 10mg  daily, Sertraline 50mg  daily, Seroquel 25mg  qhs, thiamine 100mg  daily. She is back on prn  Trazodone for agitation. Continue 24/7 care. Follow-up in 6 months, call for any changes.   Thank you for allowing me to participate in her care.  Please do not hesitate to call for any questions or concerns.   Ellouise Newer, M.D.   CC: [facility]

## 2020-10-12 ENCOUNTER — Ambulatory Visit
Admission: RE | Admit: 2020-10-12 | Discharge: 2020-10-12 | Disposition: A | Discharge status: Home / Self Care | Payer: Medicare Other | Source: Ambulatory Visit | Attending: Neurology | Admitting: Neurology

## 2020-10-13 ENCOUNTER — Telehealth: Payer: Self-pay

## 2020-10-13 NOTE — Telephone Encounter (Signed)
Pt brother called no answer left a voice mail to call the office back

## 2020-10-13 NOTE — Telephone Encounter (Signed)
-----   Message from Cameron Sprang, MD sent at 10/13/2020 12:55 PM EDT ----- Pls let her brother Dr. Orland Mustard know that the brain MRI did not show any changes from her prior scan, no evidence of stroke. How is she doing? thanks

## 2020-10-14 ENCOUNTER — Telehealth: Payer: Self-pay | Admitting: Physician Assistant

## 2020-10-14 NOTE — Telephone Encounter (Signed)
John called in about results for Sarah Gutierrez. He said he missed the phone call

## 2020-10-15 NOTE — Telephone Encounter (Signed)
Pls see Result Note from 10/13/20:   Pls let her brother Dr. Orland Mustard know that the brain MRI did not show any changes from her prior scan, no evidence of stroke. How is she doing? Thanks

## 2020-10-15 NOTE — Telephone Encounter (Signed)
Left message for Dr.John Orland Mustard to call back.

## 2020-10-27 ENCOUNTER — Telehealth: Payer: Self-pay | Admitting: Neurology

## 2020-10-27 NOTE — Telephone Encounter (Signed)
Spoke to patient's brother Dr. Orland Mustard. Since her last visit, she continues to be dysarthric, which he agrees is a new change in the past 6 months at least. She has occasional choking and had a barium swallow in April, with a procedure planned in August. She is on a soft diet. No other motor signs/weakness, no change in baseline ataxia. Discussed doing myasthenia panel, would repeat neurological exam and consider either EMG to assess for neuromuscular disorder versus DATscan (?MSA-C). F/u on July 5 at 1:30pm, Dr. Orland Mustard knows to call for any changes before then.

## 2020-10-27 NOTE — Telephone Encounter (Signed)
Left VM to call back 

## 2020-10-27 NOTE — Telephone Encounter (Signed)
If reminder phone calls go to him, that is okay. He already knows, thanks

## 2020-11-16 ENCOUNTER — Other Ambulatory Visit (INDEPENDENT_AMBULATORY_CARE_PROVIDER_SITE_OTHER): Payer: Medicare Other

## 2020-11-16 ENCOUNTER — Ambulatory Visit (INDEPENDENT_AMBULATORY_CARE_PROVIDER_SITE_OTHER): Payer: Medicare Other | Admitting: Neurology

## 2020-11-16 ENCOUNTER — Other Ambulatory Visit: Payer: Self-pay

## 2020-11-16 VITALS — BP 157/76 | HR 67 | Ht 65.0 in | Wt 130.0 lb

## 2020-11-16 DIAGNOSIS — F0391 Unspecified dementia with behavioral disturbance: Secondary | ICD-10-CM

## 2020-11-16 DIAGNOSIS — R471 Dysarthria and anarthria: Secondary | ICD-10-CM | POA: Diagnosis not present

## 2020-11-16 DIAGNOSIS — F03B18 Unspecified dementia, moderate, with other behavioral disturbance: Secondary | ICD-10-CM

## 2020-11-16 NOTE — Progress Notes (Signed)
NEUROLOGY FOLLOW UP OFFICE NOTE  Sarah Gutierrez Gutierrez [birth year]  HISTORY OF PRESENT ILLNESS: I had the pleasure of seeing Sarah Gutierrez in follow-up in the neurology clinic on 11/16/2020.  The patient was last seen 2 months ago for dementia. She presents for an earlier follow-up due to worsening dysarthria. She is alone in the office today. Her repeat brain MRI without contrast done 09/2020 did not show any acute changes. There was moderate cerebral and cerebellar atrophy, minimal chronic microvascular disease. She denies any complaints today. After discussion with her brother Dr. Orland Mustard that the dysarthria has worsened, we discussed next steps, including additional bloodwork and testing. She denies any drooling. She notes her speech is sometimes different. She denies any headaches, dizziness, vision changes, no falls. She has been wheelchair-bound due to significant ataxia. She continues on daily thiamine 100mg , Donepezil 10mg  daily, Sertraline 50mg  daily, Seroquel 25mg  qhs, and prn Trazodone for agitation.   History on Initial Assessment 07/31/2018: This is a pleasant 75 year old right-handed woman with a history of hypertension, remote alcohol abuse, presenting for evaluation of continued mental status changes. Records from her hospitalization and report from her brother were reviewed, this appears to be quite an acute change for her. She had been living alone independently with family checking in on regularly. She apparently did not answer phone calls since Christmas so her brother called law enforcement on 05/20/2018 for a wellness check. They found her on the couch, non-verbal, following only basic commands. There was half a can of beer without other alcohol noted. She was non-verbal and not following commands in the ER.  Bloodwork showed sodium of 155, calcium 12, WBC 10.8, lactic acid 5.33, negative UA, EtOH level <10, ammonia 20, CD 53, BUN 58, creatinine 1.68. Head CT no acute changes. B1  level was low 28.3. 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 She was discharged to rehab mid-January then moved to [facility] because she still has not returned to baseline. Her brother provided additional information that she had been sober for many years but family feels she has recently started drinking beer again within the past 6 months. Her brother found a case of beer in the pantry and found 4-6 empty cans in her living room. Prior to hospitalization, she was cognitively pretty good but not totally normal. She has a Oceanographer in Nutritional therapist. As far as they knew, she had been up to date with bill payments, very active walking 3 miles daily, driving, doing all her yardwork. She has never been very social. Now she does not know what the year or day of the week is, and confabulates quite a bit. He feels she may have had mild confabulation prior. She is aware that she cannot come up with the right words.  He notes she has some nystagmus and tremor that is new, and difficulty with gait and balance. He has not noticed any personality changes, she is very pleasant.   During her visit, she appears to answer questions appropriately but has word-finding difficulties and slow to respond, having to think about her answers. She reports occasional headaches mostly behind her left eye, sometimes affecting her vision. She reports vertical diplopia. She has dizziness when standing. She has numbness over her left wrist. She denies any bowel/bladder incontinence, but family reports she wears Depends. There have been  2 instances where they came to [facility] to find her caked with feces, last week she was on the floor covered with feces (but with a clean diaper). She was previously on Ativan TID, this was discontinued 3/6  but she has been receiving prn Ativan once a day for agitation and restlessness, wanting to get up. Family has not seen the agitation that SNF describes. They report she is not sleeping and wanting to get up. She is very unsteady, PT has recommended wheelchair only, she would fall back when standing. Family denies any hallucinations. He reports an 11-lb weight loss at the SNF because she was refusing food, after family intervened, this has improved. Strength is better than 6 weeks ago.   PAST MEDICAL HISTORY: Past Medical History:  Diagnosis Date   Cognitive impairment    Encephalopathy    Hypertension     MEDICATIONS: Current Outpatient Medications on File Prior to Visit  Medication Sig Dispense Refill   acetaminophen (TYLENOL) 325 MG tablet Take 650 mg by mouth every 6 (six) hours as needed for mild pain or moderate pain.     amLODipine (NORVASC) 2.5 MG tablet Take 2.5 mg by mouth daily.     cephALEXin (KEFLEX) 500 MG capsule Take 1 capsule (500 mg total) by mouth 4 (four) times daily. (Patient not taking: No sig reported) 28 capsule 0   diphenhydrAMINE (BENADRYL) 2 % cream Apply 1 application topically daily as needed for itching (rash on right palm).     donepezil (ARICEPT) 10 MG tablet Take 1/2 tablet daily for 2 weeks, then increase to 1 tablet daily and continue (Patient taking differently: Take 10 mg by mouth at bedtime.) 30 tablet 11   loperamide (IMODIUM) 2 MG capsule Take 2 mg by mouth 3 (three) times daily as needed for diarrhea or loose stools.     loratadine (CLARITIN) 10 MG tablet Take 10 mg by mouth daily as needed for allergies.     Multiple Vitamins-Minerals (MULTIVITAMIN PO) Take 1 tablet by mouth daily.     ondansetron (ZOFRAN) 4 MG tablet Take 4 mg by mouth every 6 (six) hours as needed for nausea or vomiting.     polyethylene glycol (MIRALAX / GLYCOLAX) packet Take 17 g by mouth daily. 14 each 0   pravastatin (PRAVACHOL) 10 MG tablet Take 10 mg by mouth every evening.      QUEtiapine (SEROQUEL) 25 MG tablet Take 1 tablet (25 mg total) by mouth as needed (May give extra 25 mg tablet once daily as needed for agitation.). (Patient taking differently: Take 25 mg by mouth See admin instructions. Take 1 tablet (25mg ) by mouth at bedtime) 30 tablet 0   senna-docusate (SENOKOT-S) 8.6-50 MG tablet Take 1 tablet by mouth daily. (Patient taking differently: Take 2 tablets by mouth at bedtime.) 30 tablet 0   sertraline (ZOLOFT) 50 MG tablet Take 50 mg by mouth daily.     thiamine 100 MG tablet Take 100 mg by mouth daily.     traZODone (DESYREL) 50 MG tablet Take 1 tablet (50 mg total) by mouth 3 (three) times daily as needed (agitation). 30 tablet 0   No current facility-administered medications on file prior to visit.    ALLERGIES: Allergies  Allergen Reactions   Oysters [Shellfish Allergy] Nausea And Vomiting    FAMILY HISTORY: Family History  Problem Relation Age of Onset   CAD Brother 32       has coronary stent     SOCIAL HISTORY: Social History  Socioeconomic History   Marital status: Widowed    Spouse name: Not on file   Number of children: Not on file   Years of education: Not on file   Highest education level: Not on file  Occupational History   Not on file  Tobacco Use   Smoking status: Never   Smokeless tobacco: Never  Vaping Use   Vaping Use: Never used  Substance and Sexual Activity   Alcohol use: Not Currently    Comment: hx alcohol abuse per family   Drug use: Never   Sexual activity: Not Currently  Other Topics Concern   Not on file  Social History Narrative   Pt is R handed   Lives in nursing facility - [facility]   Pt has 1 child   Masters degree in teaching   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 + moderate dysarthria but answers better today compared to last visit. Able to follow commands. Fund of knowledge is reduced. Attention and concentration are normal.   Cranial nerves: Pupils equal, round. Extraocular movements intact with no nystagmus. Visual fields full.  No facial asymmetry.  Motor: Increased tone on both UE, with possible cogwheeling with distraction. muscle strength 5/5 throughout with no pronator drift. Decreased finger taps on the left hand. Ataxic on finger to nose and heel to shin testing. Reflexes brisk +2 throughout, toes down. No ankle clonus, negative Hoffman sign, negative hyperactive pectoralis reflex. Negative glabellar and jaw jerk reflexes.   MPRESSION: This is a pleasant 75 yo RH woman with a history of  history of hypertension, remote alcohol abuse who had resumed alcohol intake, with Korsakoff syndrome. She initially presented with acute mental status changes in January 2020 with nystagmus and gait ataxia. Thiamine level low on admission, constellation of symptoms strongly suggestive of Wernicke encephalopathy. In May there was note of change in symptoms with worsening dysarthria. Repeat MRI did not show any new changes. She remains dysarthric, etiology of worsening dysarthria unclear, possibly related to underlying cerebellar neurodegenerative condition, MSA-C is also considered. DATscan will be ordered to assess for parkinsonian pathology. Other causes of dysarthria will also be tested for, check myasthenia panel, vitamin E, vitamin B1, anti-GAD antibody. May do EMG/NCV in the future if DATscan is unrevealing. Continue all medications, follow-up as scheduled in November 2022, call for any changes.     Thank you for allowing me to participate in her care.  Please do not hesitate to call for any questions or concerns.   Ellouise Newer, M.D.   CC: [facility]

## 2020-11-16 NOTE — Patient Instructions (Addendum)
Bloodwork for myasthenia panel, vitamin E, vitamin B1, anti-GAD antibody  2. Schedule DATscan  3. Continue all your medications  4. Follow-up as scheduled in November 2022, call for any changes

## 2020-11-17 ENCOUNTER — Telehealth: Payer: Self-pay | Admitting: Neurology

## 2020-11-17 NOTE — Telephone Encounter (Signed)
[facility] called and clarification made no changes made to pt medication

## 2020-11-22 ENCOUNTER — Encounter: Payer: Self-pay | Admitting: Neurology

## 2020-11-23 LAB — VITAMIN B1: Vitamin B1 (Thiamine): 103 nmol/L — ABNORMAL HIGH (ref 8–30)

## 2020-11-23 LAB — GLUTAMIC ACID DECARBOXYLASE AUTO ABS: Glutamic Acid Decarb Ab: <5 IU/mL (ref <5)

## 2020-11-23 LAB — VITAMIN E
Gamma-Tocopherol (Vit E): <1 mg/L (ref <=4.3)
Vitamin E (Alpha Tocopherol): 14.5 mg/L (ref 5.7–19.9)

## 2020-11-23 LAB — MYASTHENIA GRAVIS PANEL 1
A CHR BINDING ABS: <0.3 nmol/L
STRIATED MUSCLE AB SCREEN: NEGATIVE

## 2020-12-07 ENCOUNTER — Other Ambulatory Visit: Payer: Self-pay | Admitting: Gastroenterology

## 2020-12-08 NOTE — Progress Notes (Signed)
Preop instructions for:   Sarah Gutierrez                        Date of Birth  08-26-1945                           Date of Procedure:  12/14/2020       Doctor: DR Wilford Corner  Time to arrive at [facility]:  1000am  Report to: Admitting  Procedure: EGD, Balloon Dilation, Colonoscopy    Do not eat or drink past midnight the night before your procedure.(To include any tube feedings-must be discontinued)  Follow all bowel prep instructions per MD.   Take these morning medications only with sips of water.(or give through gastrostomy or feeding tube). Amlodipine if takes in am and Zoloft if takes in am   Note: No Insulin or Diabetic meds should be given or taken the morning of the procedure!   Facility contact:  [place]                  Phone:  [phone]                Health Care POA:  Transportation contact phone#:  Dr Harvel [phone] Please send day of procedure:current med list and meds last taken that day, confirm nothing by mouth status from what time, Patient Demographic info( to include DNR status, problem list, allergies)   RN contact name/phone#:    [phone]                         and  Fax [phone]  [place] card and picture ID Leave all jewelry and other valuables at place where living( no metal or rings to be worn) No contact lens Women-no make-up, no lotions,perfumes,powders Men-no colognes,lotions  Any questions day of procedure,call  Endoscopy [phone]   Sent from :[facility]                   [place]                   Fax:[phone]  Sent by :               RN

## 2020-12-08 NOTE — Progress Notes (Signed)
Called and spoke with guardian, Dr Harvel Quale at [phone].  PT resides at [facility] in [place], in assisted.  Living.  Called and spoke with Chrisitian, nurse at [phone].  Fax number of [phone] Called and requested Current Mar, demographic sheete, and guardian papers.  Called DR Michail Sermon office and spoke with [facility] in office and made aware that [facility] does not have preprocedure instructions and they need them fax.  Fax number given.  Along with call back number.

## 2020-12-08 NOTE — Progress Notes (Signed)
Rerequeested infor from [facility].  They are to fax.

## 2020-12-09 NOTE — Progress Notes (Signed)
Called and requested again the guardian papers on pt for upcoming procedure.

## 2020-12-09 NOTE — Progress Notes (Signed)
Received and placed on chart from [facility] the following:  Current MAR and demographic sheet.   Still awaitin guardian papers.

## 2020-12-13 NOTE — Anesthesia Preprocedure Evaluation (Addendum)
Anesthesia Evaluation  Patient identified by MRN, date of birth, ID band Patient awake    Reviewed: Allergy & Precautions, NPO status , Patient's Chart, lab work & pertinent test results  Airway Mallampati: II  TM Distance: >3 FB Neck ROM: Full    Dental no notable dental hx. (+) Teeth Intact, Dental Advisory Given   Pulmonary neg pulmonary ROS,    Pulmonary exam normal breath sounds clear to auscultation       Cardiovascular hypertension, Normal cardiovascular exam Rhythm:Regular Rate:Normal     Neuro/Psych Dementia negative neurological ROS  negative psych ROS   GI/Hepatic negative GI ROS, Neg liver ROS,   Endo/Other  negative endocrine ROS  Renal/GU Renal disease     Musculoskeletal negative musculoskeletal ROS (+)   Abdominal   Peds  Hematology negative hematology ROS (+)   Anesthesia Other Findings   Reproductive/Obstetrics                            Anesthesia Physical Anesthesia Plan  ASA: 3  Anesthesia Plan: MAC   Post-op Pain Management:    Induction:   PONV Risk Score and Plan: Treatment may vary due to age or medical condition  Airway Management Planned: Natural Airway  Additional Equipment: None  Intra-op Plan:   Post-operative Plan:   Informed Consent: I have reviewed the patients History and Physical, chart, labs and discussed the procedure including the risks, benefits and alternatives for the proposed anesthesia with the patient or authorized representative who has indicated his/her understanding and acceptance.     Consent reviewed with POA and Dental advisory given  Plan Discussed with: CRNA  Anesthesia Plan Comments: (EGD for Dysphagia  Hx from brother who is POA)       Anesthesia Quick Evaluation

## 2020-12-14 ENCOUNTER — Ambulatory Visit (HOSPITAL_COMMUNITY): Payer: Medicare Other | Admitting: Anesthesiology

## 2020-12-14 ENCOUNTER — Encounter (HOSPITAL_COMMUNITY)
Admission: RE | Disposition: A | Discharge status: Nursing Home (Includes ICF) | Payer: Self-pay | Source: Home / Self Care | Attending: Gastroenterology

## 2020-12-14 ENCOUNTER — Other Ambulatory Visit: Payer: Self-pay

## 2020-12-14 ENCOUNTER — Ambulatory Visit (HOSPITAL_COMMUNITY)
Admission: RE | Admit: 2020-12-14 | Discharge: 2020-12-14 | Disposition: A | Discharge status: Other Acute Inpatient Hospital | Payer: Medicare Other | Attending: Gastroenterology | Admitting: Gastroenterology

## 2020-12-14 ENCOUNTER — Encounter (HOSPITAL_COMMUNITY): Payer: Self-pay | Admitting: Gastroenterology

## 2020-12-14 DIAGNOSIS — K2210 Ulcer of esophagus without bleeding: Secondary | ICD-10-CM | POA: Diagnosis not present

## 2020-12-14 DIAGNOSIS — K2970 Gastritis, unspecified, without bleeding: Secondary | ICD-10-CM | POA: Diagnosis not present

## 2020-12-14 DIAGNOSIS — Z87891 Personal history of nicotine dependence: Secondary | ICD-10-CM | POA: Diagnosis not present

## 2020-12-14 DIAGNOSIS — Z79899 Other long term (current) drug therapy: Secondary | ICD-10-CM | POA: Diagnosis not present

## 2020-12-14 DIAGNOSIS — K2289 Other specified disease of esophagus: Secondary | ICD-10-CM | POA: Insufficient documentation

## 2020-12-14 DIAGNOSIS — Z91013 Allergy to seafood: Secondary | ICD-10-CM | POA: Insufficient documentation

## 2020-12-14 DIAGNOSIS — R1310 Dysphagia, unspecified: Secondary | ICD-10-CM

## 2020-12-14 HISTORY — PX: ESOPHAGOGASTRODUODENOSCOPY (EGD) WITH PROPOFOL: SHX5813

## 2020-12-14 HISTORY — PX: BIOPSY: SHX5522

## 2020-12-14 SURGERY — ESOPHAGOGASTRODUODENOSCOPY (EGD) WITH PROPOFOL
Anesthesia: Monitor Anesthesia Care

## 2020-12-14 MED ORDER — PROPOFOL 500 MG/50ML IV EMUL
INTRAVENOUS | Status: DC | PRN
  Administered 2020-12-14: 110 ug/kg/min via INTRAVENOUS

## 2020-12-14 MED ORDER — EPHEDRINE SULFATE-NACL 50-0.9 MG/10ML-% IV SOSY
PREFILLED_SYRINGE | INTRAVENOUS | Status: DC | PRN
  Administered 2020-12-14: 10 mg via INTRAVENOUS

## 2020-12-14 MED ORDER — LACTATED RINGERS IV SOLN: INTRAVENOUS | Status: DC

## 2020-12-14 MED ORDER — PROPOFOL 10 MG/ML IV BOLUS
INTRAVENOUS | Status: DC | PRN
Start: 2020-12-14 — End: 2020-12-14
  Administered 2020-12-14: 25 mg via INTRAVENOUS

## 2020-12-14 MED ORDER — LIDOCAINE HCL (CARDIAC) PF 100 MG/5ML IV SOSY
PREFILLED_SYRINGE | INTRAVENOUS | Status: DC | PRN
  Administered 2020-12-14: 100 mg via INTRAVENOUS

## 2020-12-14 SURGICAL SUPPLY — 25 items

## 2020-12-14 NOTE — Op Note (Signed)
[facility] Patient Name: Sarah Gutierrez Procedure Date: 12/14/2020 MRN: [id number] Attending MD: [name] , MD Date of Birth: February 25, 1946 CSN: [id number] Age: 75 Admit Type: Outpatient Procedure:                Upper GI endoscopy Indications:              Dysphagia, Abnormal UGI series Providers:                [name], MD, Doristine Johns, RN, Hinton Dyer, Stephanie [place] CRNA Referring MD:              Medicines:                Propofol per Anesthesia, Monitored Anesthesia Care Complications:            No immediate complications. Estimated Blood Loss:     Estimated blood loss was minimal. Procedure:                Pre-Anesthesia Assessment:                           - Prior to the procedure, a History and Physical                            was performed, and patient medications and                            allergies were reviewed. The patient's tolerance of                            previous anesthesia was also reviewed. The risks                            and benefits of the procedure and the sedation                            options and risks were discussed with the patient.                            All questions were answered, and informed consent                            was obtained. Prior Anticoagulants: The patient has                            taken no previous anticoagulant or antiplatelet                            agents. ASA Grade Assessment: III - A patient with                            severe systemic disease. After reviewing the risks  and benefits, the patient was deemed in                            satisfactory condition to undergo the procedure.                           After obtaining informed consent, the endoscope was                            passed under direct vision. Throughout the                            procedure, the patient's blood pressure,  pulse, and                            oxygen saturations were monitored continuously. The                            [id number]-[id number] ([id number]) Olympus endoscope was introduced                            through the mouth, and advanced to the second part                            of duodenum. The upper GI endoscopy was                            accomplished without difficulty. The patient                            tolerated the procedure well. Scope In: Scope Out: Findings:      Segmental moderate mucosal changes characterized by congestion,       nodularity and ulceration were found in the mid esophagus (22 cm - 30 cm       from the incisors). Biopsies were taken with a cold forceps for       histology. Estimated blood loss was minimal.      The exam of the esophagus was otherwise normal.      Localized mild inflammation characterized by congestion (edema) and       erythema was found in the gastric antrum.      The cardia and gastric fundus were normal on retroflexion.      The examined duodenum was normal. Impression:               - Congested, nodular, ulcerated mucosa in the                            esophagus. Biopsied.                           - Gastritis.                           - Normal examined duodenum. Moderate Sedation:      N/A - MAC procedure Recommendation:           -  Patient has a contact number available for                            emergencies. The signs and symptoms of potential                            delayed complications were discussed with the                            patient. Return to normal activities tomorrow.                            Written discharge instructions were provided to the                            patient.                           - Await pathology results. Procedure Code(s):        --- Professional ---                           [phone], Esophagogastroduodenoscopy, flexible,                            transoral; with biopsy, single  or multiple Diagnosis Code(s):        --- Professional ---                           R13.10, Dysphagia, unspecified                           K29.70, Gastritis, unspecified, without bleeding                           K22.10, Ulcer of esophagus without bleeding                           K22.8, Other specified diseases of esophagus                           R93.3, Abnormal findings on diagnostic imaging of                            other parts of digestive tract CPT copyright 2019 American Medical Association. All rights reserved. The codes documented in this report are preliminary and upon coder review may  be revised to meet current compliance requirements. [name], MD 12/14/2020 11:57:21 AM This report has been signed electronically. Number of Addenda: 0

## 2020-12-14 NOTE — Anesthesia Postprocedure Evaluation (Signed)
Anesthesia Post Note  Patient: Sarah Gutierrez  Procedure(s) Performed: ESOPHAGOGASTRODUODENOSCOPY (EGD) WITH PROPOFOL BIOPSY     Patient location during evaluation: Endoscopy Anesthesia Type: MAC Level of consciousness: awake and alert Pain management: pain level controlled Vital Signs Assessment: post-procedure vital signs reviewed and stable Respiratory status: spontaneous breathing, nonlabored ventilation, respiratory function stable and patient connected to nasal cannula oxygen Cardiovascular status: blood pressure returned to baseline and stable Postop Assessment: no apparent nausea or vomiting Anesthetic complications: no   No notable events documented.  Last Vitals:  Vitals:   12/14/20 1210 12/14/20 1216  BP: (!) 123/53 (!) 137/48  Pulse: (!) 56 (!) 54  Resp: 19 17  Temp:    SpO2: 96% 96%    Last Pain:  Vitals:   12/14/20 1216  TempSrc:   PainSc: 0-No pain                 Barnet Glasgow

## 2020-12-14 NOTE — Discharge Instructions (Signed)
YOU HAD AN ENDOSCOPIC PROCEDURE TODAY: Refer to the procedure report and other information in the discharge instructions given to you for any specific questions about what was found during the examination. If this information does not answer your questions, please call [facility] office at [phone] to clarify.   YOU SHOULD EXPECT: Some feelings of bloating in the abdomen. Passage of more gas than usual. Walking can help get rid of the air that was put into your GI tract during the procedure and reduce the bloating. If you had a lower endoscopy (such as a colonoscopy or flexible sigmoidoscopy) you may notice spotting of blood in your stool or on the toilet paper. Some abdominal soreness may be present for a day or two, also.  DIET: Your first meal following the procedure should be a light meal and then it is ok to progress to your normal diet. A half-sandwich or bowl of soup is an example of a good first meal. Heavy or fried foods are harder to digest and may make you feel nauseous or bloated. Drink plenty of fluids but you should avoid alcoholic beverages for 24 hours. If you had a esophageal dilation, please see attached instructions for diet.    ACTIVITY: Your care partner should take you home directly after the procedure. You should plan to take it easy, moving slowly for the rest of the day. You can resume normal activity the day after the procedure however YOU SHOULD NOT DRIVE, use power tools, machinery or perform tasks that involve climbing or major physical exertion for 24 hours (because of the sedation medicines used during the test).   SYMPTOMS TO REPORT IMMEDIATELY: A gastroenterologist can be reached at any hour. Please call [phone]  for any of the following symptoms:  Following upper endoscopy (EGD, EUS, ERCP, esophageal dilation) Vomiting of blood or coffee ground material  New, significant abdominal pain  New, significant chest pain or pain under the shoulder blades  Painful or  persistently difficult swallowing  New shortness of breath  Black, tarry-looking or red, bloody stools  FOLLOW UP:  If any biopsies were taken you will be contacted by phone or by letter within the next 1-3 weeks. Call [phone]  if you have not heard about the biopsies in 3 weeks.  Please also call with any specific questions about appointments or follow up tests. YOU HAD AN ENDOSCOPIC PROCEDURE TODAY: Refer to the procedure report and other information in the discharge instructions given to you for any specific questions about what was found during the examination. If this information does not answer your questions, please call [facility] office at [phone] to clarify.   

## 2020-12-14 NOTE — Interval H&P Note (Signed)
History and Physical Interval Note:  12/14/2020 11:24 AM  Sarah Gutierrez  has presented today for surgery, with the diagnosis of Screening, Dysphagia.  The various methods of treatment have been discussed with the patient and family. After consideration of risks, benefits and other options for treatment, the patient has consented to  Procedure(s): ESOPHAGOGASTRODUODENOSCOPY (EGD) WITH PROPOFOL (N/A) BALLOON DILATION (N/A) COLONOSCOPY WITH PROPOFOL (N/A) as a surgical intervention.  The patient's history has been reviewed, patient examined, no change in status, stable for surgery.  I have reviewed the patient's chart and labs.  Questions were answered to the patient's satisfaction.     [name]

## 2020-12-14 NOTE — Transfer of Care (Signed)
Immediate Anesthesia Transfer of Care Note  Patient: Sarah Gutierrez  Procedure(s) Performed: ESOPHAGOGASTRODUODENOSCOPY (EGD) WITH PROPOFOL BIOPSY  Patient Location: PACU and Endoscopy Unit  Anesthesia Type:MAC  Level of Consciousness: awake and drowsy  Airway & Oxygen Therapy: Patient Spontanous Breathing and Patient connected to face mask oxygen  Post-op Assessment: Report given to RN and Post -op Vital signs reviewed and stable  Post vital signs: Reviewed and stable  Last Vitals:  Vitals Value Taken Time  BP 75/31 12/14/20 1148  Temp    Pulse 43 12/14/20 1149  Resp 17 12/14/20 1149  SpO2 100 % 12/14/20 1149  Vitals shown include unvalidated device data.  Last Pain:  Vitals:   12/14/20 1044  TempSrc: Oral  PainSc: 0-No pain         Complications: No notable events documented.

## 2020-12-14 NOTE — H&P (Signed)
Date of Initial H&P: 12/07/20  History reviewed, patient examined, no change in status, stable for surgery.

## 2020-12-15 ENCOUNTER — Encounter (HOSPITAL_COMMUNITY): Payer: Self-pay | Admitting: Gastroenterology

## 2020-12-15 LAB — SURGICAL PATHOLOGY

## 2020-12-29 IMAGING — DX DG ABDOMEN 1V
1 series · 1 of 1 positions shown · non-contrast
Comparison: CT, 05/21/2018

CLINICAL DATA: Upper abdominal pain.  Constipation.

EXAM:
ABDOMEN - 1 VIEW

[abdomen kub]
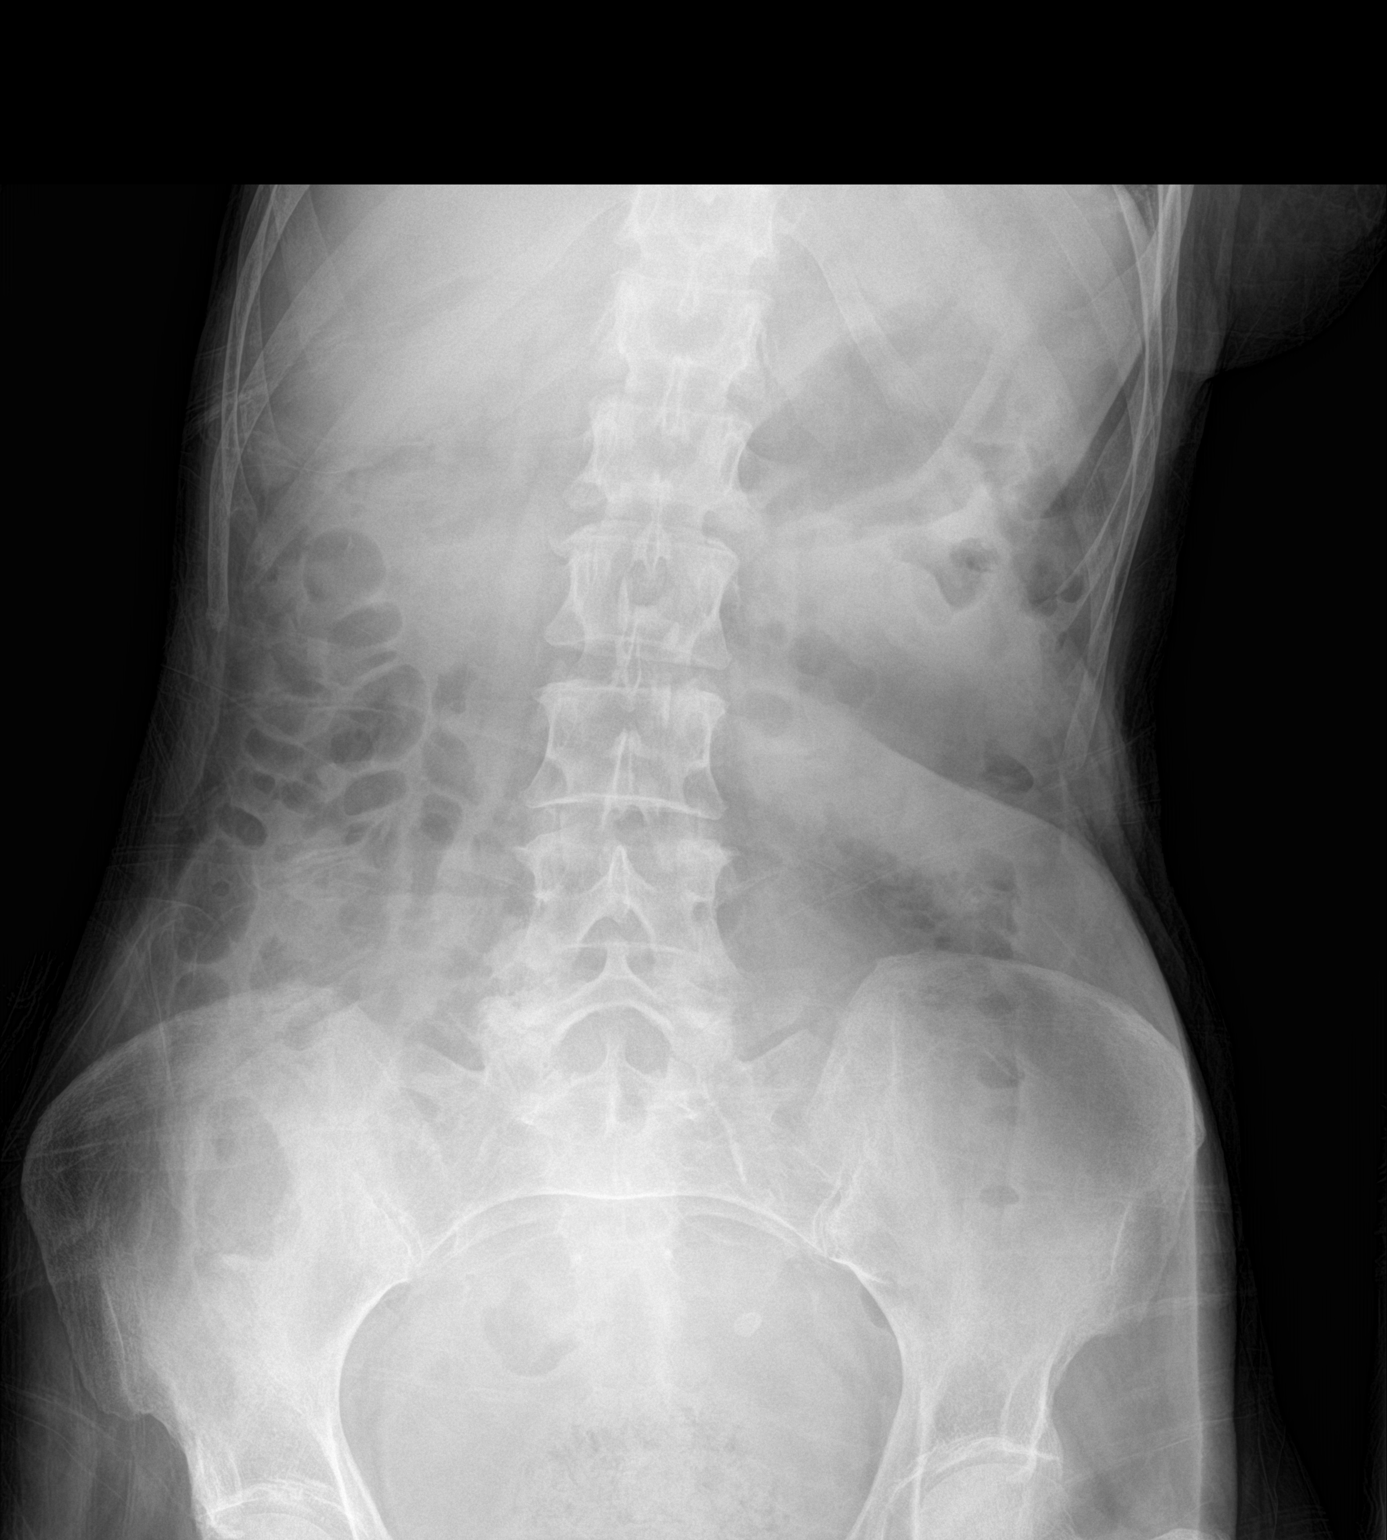

[1 of 1 positions shown; findings below may reference images not displayed]

FINDINGS: Normal bowel gas pattern. Rectum mildly distended with stool. No
significant increase in colonic stool.

No evidence of renal or ureteral stones. Soft tissues are
unremarkable.

No acute skeletal abnormality.
IMPRESSION: 1. No acute findings.
2. No increase in colonic stool. Rectum mildly distended with stool.

## 2021-01-03 ENCOUNTER — Other Ambulatory Visit: Payer: Self-pay

## 2021-01-03 ENCOUNTER — Emergency Department (HOSPITAL_COMMUNITY)
Admission: EM | Admit: 2021-01-03 | Discharge: 2021-01-04 | Disposition: A | Discharge status: Home / Self Care | Payer: Medicare Other | Attending: Emergency Medicine | Admitting: Emergency Medicine

## 2021-01-03 ENCOUNTER — Emergency Department (HOSPITAL_COMMUNITY): Payer: Medicare Other

## 2021-01-03 ENCOUNTER — Encounter (HOSPITAL_COMMUNITY): Payer: Self-pay | Admitting: Emergency Medicine

## 2021-01-03 DIAGNOSIS — W010XXA Fall on same level from slipping, tripping and stumbling without subsequent striking against object, initial encounter: Secondary | ICD-10-CM | POA: Diagnosis not present

## 2021-01-03 DIAGNOSIS — Z79899 Other long term (current) drug therapy: Secondary | ICD-10-CM | POA: Diagnosis not present

## 2021-01-03 DIAGNOSIS — S0083XA Contusion of other part of head, initial encounter: Secondary | ICD-10-CM | POA: Insufficient documentation

## 2021-01-03 DIAGNOSIS — I10 Essential (primary) hypertension: Secondary | ICD-10-CM | POA: Insufficient documentation

## 2021-01-03 DIAGNOSIS — W19XXXA Unspecified fall, initial encounter: Secondary | ICD-10-CM

## 2021-01-03 DIAGNOSIS — S0993XA Unspecified injury of face, initial encounter: Secondary | ICD-10-CM | POA: Diagnosis present

## 2021-01-03 NOTE — ED Provider Notes (Signed)
[facility] EMERGENCY DEPARTMENT Provider Note   CSN: [id number] Arrival date & time: 01/03/21  2121     History Chief Complaint  Patient presents with   Sarah Gutierrez is a 75 y.o. female.  HPI     75 year old female comes in with chief complaint of fall. Patient has history of hypertension, encephalopathy and cognitive impairment for which she is at a nursing facility.  According to EMS and the patient, she had a mechanical fall earlier today.  The fall was unwitnessed.  Patient states that she slipped and fell.  She is complaining of pain around her forehead.  She denies pain elsewhere.  No chest pain, abdominal pain, back pain.  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   Protein-calorie malnutrition, severe 05/29/2018   Pressure injury of skin 05/29/2018   AKI (acute kidney injury) ([place])    Acute metabolic encephalopathy [id number]   Dehydration 05/20/2018   Hypercalcemia 05/20/2018   Hypernatremia 05/20/2018   Lactic acid acidosis 05/20/2018   Elevated troponin 05/20/2018   Hypertension    Closed displaced fracture of middle phalanx of left ring finger 12/01/2015    Past Surgical History:  Procedure Laterality Date   ABDOMINAL HYSTERECTOMY     BIOPSY  12/14/2020   Procedure: BIOPSY;  Surgeon: Wilford Corner, MD;  Location: [facility] ENDOSCOPY;  Service: Endoscopy;;   ESOPHAGOGASTRODUODENOSCOPY (EGD) WITH PROPOFOL N/A 12/14/2020   Procedure: ESOPHAGOGASTRODUODENOSCOPY (EGD) WITH PROPOFOL;  Surgeon: Wilford Corner, MD;  Location: [facility] ENDOSCOPY;  Service: Endoscopy;  Laterality: N/A;     OB History   No obstetric history on file.     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 Authorizing Provider  acetaminophen (TYLENOL) 325 MG tablet Take 650 mg by mouth every 6 (six) hours as needed for mild pain or moderate pain.    [provider]  amLODipine (NORVASC) 2.5 MG tablet Take 2.5 mg by mouth daily. 01/26/20   [provider]  bisacodyl (DULCOLAX) 10 MG suppository Place 10 mg rectally as needed for moderate constipation.    [provider]  donepezil (ARICEPT) 10 MG tablet Take 1/2 tablet daily for 2 weeks, then increase to 1 tablet daily and continue Patient taking differently: Take 10 mg by mouth at bedtime. 10/23/18   Cameron Sprang, MD  loperamide (IMODIUM) 2 MG capsule Take 2 mg by mouth 3 (three) times daily as needed for diarrhea or loose stools.    [provider]  mineral oil liquid Take 15 mLs by mouth See admin instructions. Take 15 ml by mouth at bedtime twice weekly as needed    [provider]  Multiple Vitamins-Minerals (MULTIVITAMIN PO) Take 1 tablet by mouth daily.  [provider]  NON FORMULARY Take 2.5 mLs by mouth daily. Tooth and gum tonic use 1/2 teaspoon as directed    [provider]  ondansetron (ZOFRAN) 4 MG tablet Take 4 mg by mouth every 6 (six) hours as needed for nausea or vomiting.    [provider]  polyethylene glycol (MIRALAX / GLYCOLAX) packet Take 17 g by mouth daily. 06/01/18   Dessa Phi, DO  pravastatin (PRAVACHOL) 10 MG tablet Take 10 mg by mouth every evening.    [provider]  QUEtiapine (SEROQUEL) 25 MG tablet Take 1 tablet (25 mg total) by mouth as needed (May give  extra 25 mg tablet once daily as needed for agitation.). Patient taking differently: Take 25 mg by mouth at bedtime. 02/14/19   Charlesetta Shanks, MD  senna-docusate (SENOKOT-S) 8.6-50 MG tablet Take 1 tablet by mouth daily. Patient taking differently: Take 2 tablets by mouth at bedtime. 06/01/18   Dessa Phi, DO  sertraline (ZOLOFT) 50 MG tablet Take 50 mg by mouth daily.    [provider]  thiamine 100 MG tablet Take 100 mg by mouth daily.    [provider]  traZODone (DESYREL) 50 MG tablet Take 1 tablet (50 mg total) by mouth 3 (three) times daily as needed (agitation). 08/04/19   Tegeler, Gwenyth Allegra, MD  triamcinolone cream (KENALOG) 0.1 % Apply 1 application topically 3 (three) times daily.    [provider]    Allergies    Jeanie Cooks allergy]  Review of Systems   Review of Systems  Unable to perform ROS: Dementia   Physical Exam Updated Vital Signs BP (!) 190/71   Pulse 73   Temp 98.4 F (36.9 C)   Resp 17   Ht '5\' 5"'$  (1.651 m)   Wt 52.2 kg   SpO2 98%   BMI 19.15 kg/m   Physical Exam Vitals and nursing note reviewed.  Constitutional:      Appearance: She is well-developed.  HENT:     Head: Atraumatic.  Eyes:     Extraocular Movements: Extraocular movements intact.     Pupils: Pupils are equal, round, and reactive to light.  Cardiovascular:     Rate and Rhythm: Normal rate.  Pulmonary:     Effort: Pulmonary effort is normal.  Musculoskeletal:     Cervical back: Normal range of motion and neck supple.     Comments: Hematoma and ecchymosis of the right forehead.  Head to toe evaluation shows no hematoma, bleeding of the scalp, no facial abrasions, no spine step offs, crepitus of the chest or neck, no tenderness to palpation of the bilateral upper and lower extremities, no gross deformities, no chest tenderness, no pelvic pain.   Skin:    General: Skin is warm and dry.     Findings: Bruising and erythema present.   Neurological:     Mental Status: She is alert and oriented to person, place, and time.    ED Results / Procedures / Treatments   Labs (all labs ordered are listed, but only abnormal results are displayed) Labs Reviewed - No data to display  EKG EKG Interpretation  Date/Time:  Monday January 03 2021 21:27:48 EDT Ventricular Rate:  64 PR Interval:  155 QRS Duration: 75 QT Interval:  409 QTC Calculation: 422 R Axis:   72 Text Interpretation: Sinus rhythm Confirmed by Gerlene Fee [phone]) on 01/03/2021 11:08:37 PM  Radiology CT Head Wo Contrast  Result Date: 01/03/2021 CLINICAL DATA:  Head trauma, mod-severe;  Polytrauma, critical, head/C-spine injury suspected. Periorbital hematoma. EXAM: CT HEAD WITHOUT CONTRAST CT CERVICAL SPINE WITHOUT CONTRAST TECHNIQUE: Multidetector CT imaging of the head and cervical spine was performed following the standard protocol without intravenous contrast. Multiplanar CT image reconstructions of the cervical spine were also generated. COMPARISON:  MRI head 10/12/2020 FINDINGS: CT HEAD FINDINGS Brain: Normal anatomic configuration. Mild parenchymal volume loss is again noted, stable since prior examination. Mild periventricular white matter changes are present likely reflecting the sequela of small vessel ischemia. No abnormal intra or extra-axial mass lesion or fluid collection. No abnormal mass effect or midline shift. No evidence of acute intracranial hemorrhage or infarct. Ventricular size is normal. Cerebellum unremarkable. Vascular: No asymmetric hyperdense vasculature at the skull base. Skull: Intact Sinuses/Orbits: Moderate right periorbital soft tissue swelling is present. The right ocular globe is intact and the ocular lens is ortho topically position. The retro-orbital fat is preserved. The extraocular musculature is unremarkable. Left orbit is unremarkable. The paranasal sinuses are clear. Other: Mastoid air cells and middle ear cavities are clear. CT  CERVICAL SPINE FINDINGS Alignment: 2 mm anterolisthesis of C2 upon C3 is likely degenerative in nature. Mild reversal of the normal cervical lordosis at C3-C6 is similarly likely degenerative in nature. No focal kyphosis. Skull base and vertebrae: The craniocervical alignment is normal. Atlantodental interval is not widened. There is no acute fracture of the cervical spine. Soft tissues and spinal canal: No prevertebral fluid or swelling. No visible canal hematoma. Disc levels: There is intervertebral disc space narrowing and endplate remodeling at 075-GRM in keeping with changes of advanced degenerative disc disease. Milder degenerative changes are noted at C2-C7. The prevertebral soft tissues are not thickened on sagittal reformats. Review of the axial images demonstrates advanced uncovertebral arthrosis at C5-6 resulting in moderate bilateral neuroforaminal narrowing. Uncovertebral arthrosis at C6-7 results in mild bilateral neuroforaminal narrowing. Remaining neural foramina are widely patent. No significant canal stenosis. Upper chest: Unremarkable Other: None IMPRESSION: None no acute intracranial abnormality.  No calvarial fracture. No acute fracture or listhesis of the cervical spine. Electronically Signed   By: Fidela Salisbury M.D.   On: 01/03/2021 23:40   CT Cervical Spine Wo Contrast  Result Date: 01/03/2021 CLINICAL DATA:  Head trauma, mod-severe; Polytrauma, critical, head/C-spine injury suspected. Periorbital hematoma. EXAM: CT HEAD WITHOUT CONTRAST CT CERVICAL SPINE WITHOUT CONTRAST TECHNIQUE: Multidetector CT imaging of the head and cervical spine was performed following the standard protocol without intravenous contrast. Multiplanar CT image reconstructions of the cervical spine were also generated. COMPARISON:  MRI head 10/12/2020 FINDINGS: CT HEAD FINDINGS Brain: Normal anatomic configuration. Mild parenchymal volume loss is again noted, stable since prior examination. Mild periventricular white  matter changes are present likely reflecting the sequela of small vessel ischemia. No abnormal intra or extra-axial mass lesion or fluid collection. No abnormal mass effect or midline shift. No evidence of acute intracranial hemorrhage or infarct. Ventricular size is normal. Cerebellum unremarkable. Vascular: No asymmetric hyperdense vasculature at the skull base. Skull: Intact Sinuses/Orbits: Moderate right periorbital soft tissue swelling is present. The right ocular globe is intact and the ocular lens is ortho topically position. The retro-orbital fat is preserved. The extraocular musculature is unremarkable. Left orbit is unremarkable. The paranasal sinuses are clear. Other: Mastoid air cells and middle ear cavities are clear. CT CERVICAL SPINE FINDINGS Alignment: 2 mm anterolisthesis of C2 upon C3 is likely degenerative in nature. Mild reversal of the normal cervical lordosis at C3-C6 is similarly likely degenerative in nature. No focal kyphosis.  Skull base and vertebrae: The craniocervical alignment is normal. Atlantodental interval is not widened. There is no acute fracture of the cervical spine. Soft tissues and spinal canal: No prevertebral fluid or swelling. No visible canal hematoma. Disc levels: There is intervertebral disc space narrowing and endplate remodeling at 075-GRM in keeping with changes of advanced degenerative disc disease. Milder degenerative changes are noted at C2-C7. The prevertebral soft tissues are not thickened on sagittal reformats. Review of the axial images demonstrates advanced uncovertebral arthrosis at C5-6 resulting in moderate bilateral neuroforaminal narrowing. Uncovertebral arthrosis at C6-7 results in mild bilateral neuroforaminal narrowing. Remaining neural foramina are widely patent. No significant canal stenosis. Upper chest: Unremarkable Other: None IMPRESSION: None no acute intracranial abnormality.  No calvarial fracture. No acute fracture or listhesis of the cervical  spine. Electronically Signed   By: Fidela Salisbury M.D.   On: 01/03/2021 23:40    Procedures Procedures   Medications Ordered in ED Medications - No data to display  ED Course  I have reviewed the triage vital signs and the nursing notes.  Pertinent labs & imaging results that were available during my care of the patient were reviewed by me and considered in my medical decision making (see chart for details).    MDM Rules/Calculators/A&P                            75 y.o comes in with cc of fall. Unwitnessed fall, patient reports having some pain around her forehead.  CT of the brain and C-spine ordered. Her musculoskeletal exam is quite reassuring, I do not think any radiographs are needed. Additionally, patient is alert and appropriate with her responses.  I do not think any metabolic work-up is needed.  Final Clinical Impression(s) / ED Diagnoses Final diagnoses:  Fall, initial encounter  Contusion of face, initial encounter    Rx / DC Orders ED Discharge Orders     None        Varney Biles, MD 01/03/21 [phone]

## 2021-01-03 NOTE — ED Triage Notes (Signed)
[facility] - pt from [facility]. Staff found pt on the floor alert. Pt has a knot to right forehead. Staff stated to EMS that pt is more confused than baseline x 2 days. Pt not on thinners. Pt A&O x2 for RN

## 2021-01-03 NOTE — Discharge Instructions (Addendum)
You were evaluated in the Emergency Department and after careful evaluation, we did not find any emergent condition requiring admission or further testing in the hospital.  Your exam/testing today was overall reassuring.  CT scans did not show any significant injuries.  Recommend Tylenol for discomfort.  Please return to the Emergency Department if you experience any worsening of your condition.  Thank you for allowing [place] to be a part of your care.

## 2021-01-03 NOTE — ED Provider Notes (Signed)
  Provider Note MRN:  [id number]  Arrival date & time: 01/03/21    ED Course and Medical Decision Making  Assumed care from Dr. Kathrynn Humble at shift change.  Fall, facial bruising, imaging is reassuring, on my reassessment patient is quite pleasant, conversant, neurologically intact, normal vital signs, sitting up comfortably, appropriate for discharge.  Attempted to call patient's brother/legal guardian, no answer.  Procedures  Final Clinical Impressions(s) / ED Diagnoses     ICD-10-CM   1. Fall, initial encounter  W19.XXXA     2. Contusion of face, initial encounter  [place]       ED Discharge Orders     None         Discharge Instructions      You were evaluated in the Emergency Department and after careful evaluation, we did not find any emergent condition requiring admission or further testing in the hospital.  Your exam/testing today was overall reassuring.  CT scans did not show any significant injuries.  Recommend Tylenol for discomfort.  Please return to the Emergency Department if you experience any worsening of your condition.  Thank you for allowing [place] to be a part of your care.     Barth Kirks. [place] Small, [place] [email or web address]    Maudie Flakes, MD 01/03/21 [phone]

## 2021-01-04 NOTE — ED Notes (Signed)
Called [facility] to transport patient to [facility].

## 2021-01-30 ENCOUNTER — Emergency Department (HOSPITAL_COMMUNITY)
Admission: EM | Admit: 2021-01-30 | Discharge: 2021-01-31 | Disposition: A | Discharge status: Home / Self Care | Payer: Medicare Other | Attending: Emergency Medicine | Admitting: Emergency Medicine

## 2021-01-30 ENCOUNTER — Other Ambulatory Visit: Payer: Self-pay

## 2021-01-30 ENCOUNTER — Emergency Department (HOSPITAL_COMMUNITY): Payer: Medicare Other

## 2021-01-30 ENCOUNTER — Encounter (HOSPITAL_COMMUNITY): Payer: Self-pay | Admitting: Student

## 2021-01-30 DIAGNOSIS — S0081XA Abrasion of other part of head, initial encounter: Secondary | ICD-10-CM | POA: Diagnosis not present

## 2021-01-30 DIAGNOSIS — W19XXXA Unspecified fall, initial encounter: Secondary | ICD-10-CM

## 2021-01-30 DIAGNOSIS — Y92129 Unspecified place in nursing home as the place of occurrence of the external cause: Secondary | ICD-10-CM | POA: Diagnosis not present

## 2021-01-30 DIAGNOSIS — I10 Essential (primary) hypertension: Secondary | ICD-10-CM | POA: Insufficient documentation

## 2021-01-30 DIAGNOSIS — W01198A Fall on same level from slipping, tripping and stumbling with subsequent striking against other object, initial encounter: Secondary | ICD-10-CM | POA: Insufficient documentation

## 2021-01-30 DIAGNOSIS — S5002XA Contusion of left elbow, initial encounter: Secondary | ICD-10-CM | POA: Diagnosis not present

## 2021-01-30 DIAGNOSIS — F0390 Unspecified dementia without behavioral disturbance: Secondary | ICD-10-CM | POA: Diagnosis not present

## 2021-01-30 DIAGNOSIS — Z79899 Other long term (current) drug therapy: Secondary | ICD-10-CM | POA: Diagnosis not present

## 2021-01-30 DIAGNOSIS — Z23 Encounter for immunization: Secondary | ICD-10-CM | POA: Insufficient documentation

## 2021-01-30 DIAGNOSIS — S59902A Unspecified injury of left elbow, initial encounter: Secondary | ICD-10-CM | POA: Diagnosis present

## 2021-01-30 DIAGNOSIS — T148XXA Other injury of unspecified body region, initial encounter: Secondary | ICD-10-CM

## 2021-01-30 MED ORDER — TETANUS-DIPHTH-ACELL PERTUSSIS 5-2.5-18.5 LF-MCG/0.5 IM SUSY
0.5000 mL | PREFILLED_SYRINGE | Freq: Once | INTRAMUSCULAR | Status: AC
  Administered 2021-01-30: 0.5 mL via INTRAMUSCULAR
  Filled 2021-01-30: qty 0.5

## 2021-01-30 NOTE — ED Provider Notes (Signed)
[facility] DEPT Provider Note   CSN: [id number] Arrival date & time: 01/30/21  1517     History Chief Complaint  Patient presents with   Sarah Gutierrez is a 75 y.o. female.  This is 75 yo female with history of hypertension, cognitive impairment, recurrent falls presents to the ER secondary to a fall.  She is a very poor historian and does not recall the events leading up to her fall.  Abrasion to left elbow.  No elbow pain.  She is unsure of last tetanus shot.  History of recurrent falls and difficulty with ambulation at baseline.  She has no pain at this time.  No dyspnea, no nausea or vomiting.  No headaches.  No vision or hearing changes that are new from baseline.  No loss of bowel or bladder control.  Numbness or tingling.    Per EMS report patient picked up from [facility], patient witnessed fall where she tripped and fell forward and hit her head on a glass wall.  No seizure activity reported.  Acting at baseline prior to this event. No Thinners, no LOC.   D/w DPOA [place],Dr. Lelan Pons [phone], pt with recurrent falls. He was told she was reaching for an object and fell forward out of her wheelchair and hit the wall in front of her. Injury to left elbow as well. No thinners. No reported LOC.    Level 5 caveat, mental status impairment/dementia    The history is provided by the patient. No language interpreter was used.  Fall This is a recurrent problem. The current episode started less than 1 hour ago. Pertinent negatives include no chest pain, no abdominal pain, no headaches and no shortness of breath.      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    Protein-calorie malnutrition, severe 05/29/2018   Pressure injury of skin 05/29/2018   AKI (acute kidney injury) ([place])    Acute metabolic encephalopathy 82/95/6213   Dehydration 05/20/2018   Hypercalcemia 05/20/2018   Hypernatremia 05/20/2018   Lactic acid acidosis 05/20/2018   Elevated troponin 05/20/2018   Hypertension    Closed displaced fracture of middle phalanx of left ring finger 12/01/2015    Past Surgical History:  Procedure Laterality Date   ABDOMINAL HYSTERECTOMY     BIOPSY  12/14/2020   Procedure: BIOPSY;  Surgeon: Wilford Corner, MD;  Location: [facility];  Service: Endoscopy;;   ESOPHAGOGASTRODUODENOSCOPY (EGD) WITH PROPOFOL N/A 12/14/2020   Procedure: ESOPHAGOGASTRODUODENOSCOPY (EGD) WITH PROPOFOL;  Surgeon: Wilford Corner, MD;  Location: [facility];  Service: Endoscopy;  Laterality: N/A;     OB History   No obstetric history on file.     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 Authorizing Provider  acetaminophen (TYLENOL) 325 MG tablet Take 650 mg by mouth every 6 (six) hours as needed for mild pain or moderate pain.    [provider]  amLODipine (NORVASC) 2.5 MG tablet Take 2.5 mg by mouth daily. 01/26/20   [provider]  bisacodyl (DULCOLAX) 10 MG suppository Place 10 mg rectally as needed for moderate constipation.    [provider]  donepezil (ARICEPT) 10 MG tablet Take 1/2 tablet daily for 2 weeks, then increase to 1 tablet daily and continue Patient taking differently: Take 10 mg by mouth at bedtime. 10/23/18   Cameron Sprang, MD   loperamide (IMODIUM) 2 MG capsule Take 2 mg by mouth 3 (three) times daily as needed for diarrhea or loose stools.    [provider]  mineral oil liquid Take 15 mLs by mouth See admin instructions. Take 15 ml by mouth at bedtime twice weekly as needed    [provider]  Multiple Vitamins-Minerals (MULTIVITAMIN PO) Take 1 tablet by mouth daily.    [provider]  NON FORMULARY Take 2.5 mLs by mouth daily. Tooth and gum tonic use 1/2 teaspoon as directed    [provider]  ondansetron (ZOFRAN) 4 MG tablet Take 4 mg by mouth every 6 (six) hours as needed for nausea or vomiting.    [provider]  polyethylene glycol (MIRALAX / GLYCOLAX) packet Take 17 g by mouth daily. 06/01/18   Dessa Phi, DO  pravastatin (PRAVACHOL) 10 MG tablet Take 10 mg by mouth every evening.    [provider]  QUEtiapine (SEROQUEL) 25 MG tablet Take 1 tablet (25 mg total) by mouth as needed (May give extra 25 mg tablet once daily as needed for agitation.). Patient taking differently: Take 25 mg by mouth at bedtime. 02/14/19   Charlesetta Shanks, MD  senna-docusate (SENOKOT-S) 8.6-50 MG tablet Take 1 tablet by mouth daily. Patient taking differently: Take 2 tablets by mouth at bedtime. 06/01/18   Dessa Phi, DO  sertraline (ZOLOFT) 50 MG tablet Take 50 mg by mouth daily.    [provider]  thiamine 100 MG tablet Take 100 mg by mouth daily.    [provider]  traZODone (DESYREL) 50 MG tablet Take 1 tablet (50 mg total) by mouth 3 (three) times daily as needed (agitation). 08/04/19   Tegeler, Gwenyth Allegra, MD  triamcinolone cream (KENALOG) 0.1 % Apply 1 application topically 3 (three) times daily.    [provider]    Allergies    Jeanie Cooks allergy]  Review of Systems   Review of Systems  Unable to perform ROS: Dementia  Respiratory:  Negative for shortness of breath.   Cardiovascular:  Negative for chest pain.   Gastrointestinal:  Negative for abdominal pain.  Skin:  Positive for wound.  Neurological:  Negative for headaches.   Physical Exam Updated Vital Signs BP (!) 159/61   Pulse (!) 59   Temp 98.7 F (37.1 C) (Oral)   Resp 15   SpO2 96%   Physical Exam Vitals and nursing note reviewed.  Constitutional:      General: She is not in acute distress.    Appearance: Normal appearance.  HENT:     Head: Normocephalic. No raccoon eyes, Battle's sign, right periorbital erythema or left periorbital erythema.     Comments: Abrasion to forehead    Right Ear: External ear normal.     Left Ear: External ear normal.  Nose: Nose normal.     Mouth/Throat:     Mouth: Mucous membranes are moist.  Eyes:     General: No scleral icterus.       Right eye: No discharge.        Left eye: No discharge.  Neck:     Comments: C-spine precautions in place.  No midline spinous process tenderness palpation or percussion. Cardiovascular:     Rate and Rhythm: Normal rate and regular rhythm.     Pulses: Normal pulses.     Heart sounds: Normal heart sounds.  Pulmonary:     Effort: Pulmonary effort is normal. No respiratory distress.     Breath sounds: Normal breath sounds.  Abdominal:     General: Abdomen is flat.     Tenderness: There is no abdominal tenderness.  Musculoskeletal:        General: Normal range of motion.     Cervical back: Normal range of motion.     Right lower leg: No edema.     Left lower leg: No edema.     Comments: No midline spinous process tenderness palpation or percussion, no crepitus or step-off deformity.  Rectal tone is intact  Abrasion to left elbow  Skin:    General: Skin is warm and dry.     Capillary Refill: Capillary refill takes less than 2 seconds.  Neurological:     General: No focal deficit present.     Mental Status: She is alert.     GCS: GCS eye subscore is 4. GCS verbal subscore is 5. GCS motor subscore is 6.     Cranial Nerves: Cranial nerves are intact.      Sensory: Sensation is intact.     Motor: Motor function is intact. No tremor.     Coordination: Coordination is intact.     Comments: AOx2  Psychiatric:        Mood and Affect: Mood normal.        Behavior: Behavior normal.    ED Results / Procedures / Treatments   Labs (all labs ordered are listed, but only abnormal results are displayed) Labs Reviewed - No data to display  EKG None  Radiology DG Elbow 2 Views Left  Result Date: 01/30/2021 CLINICAL DATA:  Fall, striking head.  Bleeding along the elbow. EXAM: LEFT ELBOW - 2 VIEW COMPARISON:  None. FINDINGS: No effusion or fracture on this two view series. No foreign body or specific abnormality observed. IMPRESSION: 1.  No significant abnormality identified. Electronically Signed   By: Van Clines M.D.   On: 01/30/2021 16:36   DG Knee 2 Views Right  Result Date: 01/30/2021 CLINICAL DATA:  Status post fall EXAM: RIGHT KNEE - 1-2 VIEW COMPARISON:  None. FINDINGS: No evidence of fracture, dislocation, or joint effusion. No evidence of arthropathy or other focal bone abnormality. Soft tissues are unremarkable. IMPRESSION: Negative. Electronically Signed   By: Iven Finn M.D.   On: 01/30/2021 16:35   CT HEAD WO CONTRAST (5MM)  Result Date: 01/30/2021 CLINICAL DATA:  Trip and fall EXAM: CT HEAD WITHOUT CONTRAST CT CERVICAL SPINE WITHOUT CONTRAST TECHNIQUE: Multidetector CT imaging of the head and cervical spine was performed following the standard protocol without intravenous contrast. Multiplanar CT image reconstructions of the cervical spine were also generated. COMPARISON:  01/03/2021 FINDINGS: CT HEAD FINDINGS Brain: No evidence of acute infarction, hemorrhage, hydrocephalus, extra-axial collection or mass lesion/mass effect. Mild periventricular and deep white matter hypodensity. Vascular: No hyperdense vessel or unexpected calcification.  Skull: Normal. Negative for fracture or focal lesion. Sinuses/Orbits: No acute  finding. Other: None. CT CERVICAL SPINE FINDINGS Alignment: Normal. Skull base and vertebrae: No acute fracture. No primary bone lesion or focal pathologic process. Soft tissues and spinal canal: No prevertebral fluid or swelling. No visible canal hematoma. Disc levels: Moderate multilevel disc space height loss and osteophytosis of the lower cervical levels. Upper chest: Negative. Other: None. IMPRESSION: 1. No acute intracranial pathology. Small-vessel white matter disease. 2. No fracture or static subluxation of the cervical spine. 3. Moderate multilevel cervical disc degenerative disease. Electronically Signed   By: Eddie Candle M.D.   On: 01/30/2021 16:19   CT Cervical Spine Wo Contrast  Result Date: 01/30/2021 CLINICAL DATA:  Trip and fall EXAM: CT HEAD WITHOUT CONTRAST CT CERVICAL SPINE WITHOUT CONTRAST TECHNIQUE: Multidetector CT imaging of the head and cervical spine was performed following the standard protocol without intravenous contrast. Multiplanar CT image reconstructions of the cervical spine were also generated. COMPARISON:  01/03/2021 FINDINGS: CT HEAD FINDINGS Brain: No evidence of acute infarction, hemorrhage, hydrocephalus, extra-axial collection or mass lesion/mass effect. Mild periventricular and deep white matter hypodensity. Vascular: No hyperdense vessel or unexpected calcification. Skull: Normal. Negative for fracture or focal lesion. Sinuses/Orbits: No acute finding. Other: None. CT CERVICAL SPINE FINDINGS Alignment: Normal. Skull base and vertebrae: No acute fracture. No primary bone lesion or focal pathologic process. Soft tissues and spinal canal: No prevertebral fluid or swelling. No visible canal hematoma. Disc levels: Moderate multilevel disc space height loss and osteophytosis of the lower cervical levels. Upper chest: Negative. Other: None. IMPRESSION: 1. No acute intracranial pathology. Small-vessel white matter disease. 2. No fracture or static subluxation of the cervical  spine. 3. Moderate multilevel cervical disc degenerative disease. Electronically Signed   By: Eddie Candle M.D.   On: 01/30/2021 16:19   DG Chest Portable 1 View  Result Date: 01/30/2021 CLINICAL DATA:  Fall today.  History of hypertension. EXAM: PORTABLE CHEST 1 VIEW COMPARISON:  05/28/2020 FINDINGS: Right seventh rib deformity posterolaterally favoring old healed fracture. Lungs appear clear. Upper normal heart size. No blunting of the costophrenic angles. IMPRESSION: 1. No acute findings. 2. Old right seventh rib deformity favoring healed fracture. Electronically Signed   By: Van Clines M.D.   On: 01/30/2021 16:35    Procedures Procedures   Medications Ordered in ED Medications  Tdap (BOOSTRIX) injection 0.5 mL (0.5 mLs Intramuscular Given 01/30/21 1616)    ED Course  I have reviewed the triage vital signs and the nursing notes.  Pertinent labs & imaging results that were available during my care of the patient were reviewed by me and considered in my medical decision making (see chart for details).    MDM Rules/Calculators/A&P                           This patient complains of fall; this involves an extensive number of treatment Options and is a complaint that carries with it a high risk of complications and Morbidity.  Vital signs reviewed.  Serious etiologies considered.   Patient unsure of last tetanus shot, will update tetanus status here.  Patient left elbow does not require sutures.  She is a very poor historian.  Imaging reviewed and is nonacute.    CT without obvious fracture or deformities. Patient has no midline cervical spine tenderness on palpation or with full range of motion. Patient denies radiculopathy-like symptoms and is moving all four extremities freely  without any weakness. Patient was cleared from cervical collar.  Imaging overall negative, patient with recurrent falls.  Discussed with DPOA, brother ([place],Dr. Lelan Pons [phone]) regarding  recurrent falls.   Patient with possible concussion given low mechanism head injury. Given concussion precautions and instructions   The patient improved significantly and was discharged in stable condition. Detailed discussions were had with the patient regarding current findings, and need for close f/u with PCP or on call doctor. The patient has been instructed to return immediately if the symptoms worsen in any way for re-evaluation. Patient verbalized understanding and is in agreement with current care plan. All questions answered prior to discharge.     Final Clinical Impression(s) / ED Diagnoses Final diagnoses:  Fall, initial encounter  Abrasion  Contusion of left elbow, initial encounter    Rx / DC Orders ED Discharge Orders     None        Jeanell Sparrow, DO 01/30/21 1803

## 2021-01-30 NOTE — ED Notes (Signed)
[facility] called for transport.  

## 2021-01-30 NOTE — ED Notes (Signed)
Patient back from CT.

## 2021-01-30 NOTE — ED Notes (Signed)
Patient has a bleeding ST to her left elbow. Placed a bandage on it.

## 2021-01-30 NOTE — Discharge Instructions (Addendum)
Based on the events which brought you to the ER today, it is possible that you may have a concussion. A concussion occurs when there is a blow to the head or body, with enough force to shake the brain and disrupt how the brain functions. You may experience symptoms such as headaches, sensitivity to light/noise, dizziness, cognitive slowing, difficulty concentrating / remembering, trouble sleeping and drowsiness. These symptoms may last anywhere from hours/days to potentially weeks/months. While these symptoms are very frustrating and perhaps debilitating, it is important that you remember that they will improve over time. Everyone has a different rate of recovery; it is difficult to predict when your symptoms will resolve. In order to allow for your brain to heal after the injury, we recommend that you see your primary physician or a physician knowledgeable in concussion management. We also advise you to let your body and brain rest: avoid physical activities (sports, gym, and exercise) and reduce cognitive demands (reading, texting, TV watching, computer use, video games, etc). School attendance, after-school activities and work may need to be modified to avoid increasing symptoms. We recommend against driving until until all symptoms have resolved. You should take '650mg'$  of Acetaminophen (Tylenol) every 4 hours as needed for pain control; however, taking anti-inflammatory medication (Motrin/Advil/Ibuprofen) is not advised. Come back to the ER right away if you are having repeated episodes of vomiting, severe/worsening headache/dizziness or any other symptom that alarms you. We recommended that someone stay with you for the next 24 hours to monitor for these worrisome symptoms.

## 2021-01-30 NOTE — ED Triage Notes (Signed)
Patient BIB [facility] from [facility] after a witnessed fall.  Patient tripped in the lobby and hit her head on the glass wall.  She has an abrasion on right FH, and right knee.  Denies pain.  Patient is A&OX2, this is her baseline and her speech is weird per facility this is also her baseline. Patient has been agitated today but not aggressive.  200/90 84-HR 20-RR 97% room air 108-CBG 97.9-temp

## 2021-01-30 NOTE — ED Notes (Signed)
Patient transported to CT 

## 2021-01-31 NOTE — ED Notes (Signed)
Patient current resting quietly in bed in hallway.  Respirations even and unlabored.  Waiting for transportation back to facility

## 2021-02-08 ENCOUNTER — Other Ambulatory Visit: Payer: Self-pay | Admitting: *Deleted

## 2021-02-08 DIAGNOSIS — R198 Other specified symptoms and signs involving the digestive system and abdomen: Secondary | ICD-10-CM

## 2021-02-08 NOTE — Progress Notes (Signed)
Patient scheduled to see Dr. Eston Mould 04/15/21

## 2021-03-23 ENCOUNTER — Emergency Department (HOSPITAL_COMMUNITY)
Admission: EM | Admit: 2021-03-23 | Discharge: 2021-03-23 | Disposition: A | Discharge status: Skilled Nursing Facility | Payer: Medicare Other | Attending: Emergency Medicine | Admitting: Emergency Medicine

## 2021-03-23 ENCOUNTER — Emergency Department (HOSPITAL_COMMUNITY): Payer: Medicare Other

## 2021-03-23 ENCOUNTER — Other Ambulatory Visit: Payer: Self-pay

## 2021-03-23 DIAGNOSIS — S0990XA Unspecified injury of head, initial encounter: Secondary | ICD-10-CM | POA: Diagnosis present

## 2021-03-23 DIAGNOSIS — W1839XA Other fall on same level, initial encounter: Secondary | ICD-10-CM | POA: Insufficient documentation

## 2021-03-23 DIAGNOSIS — I10 Essential (primary) hypertension: Secondary | ICD-10-CM | POA: Insufficient documentation

## 2021-03-23 DIAGNOSIS — S0101XA Laceration without foreign body of scalp, initial encounter: Secondary | ICD-10-CM | POA: Insufficient documentation

## 2021-03-23 DIAGNOSIS — Z79899 Other long term (current) drug therapy: Secondary | ICD-10-CM | POA: Insufficient documentation

## 2021-03-23 DIAGNOSIS — W19XXXA Unspecified fall, initial encounter: Secondary | ICD-10-CM

## 2021-03-23 MED ORDER — LORAZEPAM 2 MG/ML IJ SOLN
0.5000 mg | Freq: Once | INTRAMUSCULAR | Status: AC
  Administered 2021-03-23: 0.5 mg via INTRAVENOUS
  Filled 2021-03-23: qty 1

## 2021-03-23 MED ORDER — HALOPERIDOL LACTATE 5 MG/ML IJ SOLN
2.0000 mg | Freq: Once | INTRAMUSCULAR | Status: AC
  Administered 2021-03-23: 2 mg via INTRAVENOUS
  Filled 2021-03-23: qty 1

## 2021-03-23 NOTE — ED Notes (Signed)
Alert, NAD, calm, interactive. Back from CT. Given warm blanket, door open, in view of nurses station.

## 2021-03-23 NOTE — Discharge Instructions (Addendum)
Staples will need to be removed in 8-12 days.  No injuries found on CT scans today.

## 2021-03-23 NOTE — ED Triage Notes (Signed)
Pt BIB EMS due to a fall. Pt lives at [facility]. Pt has a hx of dementia. Pt is axox2. Pt has a laceration to left side of head.

## 2021-03-23 NOTE — ED Notes (Signed)
Pt refused vitals 

## 2021-03-23 NOTE — ED Notes (Signed)
Report called to [facility]

## 2021-03-23 NOTE — ED Notes (Signed)
Pt is 7th on [facility] list

## 2021-03-23 NOTE — Progress Notes (Signed)
   03/23/21 1450  Clinical Encounter Type  Visited With Patient  Visit Type Social support;Psychological support  Referral From Nurse  Consult/Referral To Chaplain   Chaplain Jorene Guest spoke with ED unit secretary Delores. Per Britt Boozer, the attending nurse requests chaplain support because the patient is emotional. The attending nurse, Wille Glaser, greeted Sarah Gutierrez and explained the patient's emotional state. Ike Bene actively listened as the patient emotionally spoke of her mother and grandmother. She tearfully asked when did her mother pass away. Chaplain offered the hospitality of a warm blanket. Chaplain provided a calming presence. Chaplain asked guided questions to redirect the patient into exploring her life. 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 The patient appeared to be calmer and agreed to take a nap. Chaplain ended the visit with a prayer. This note was prepared by Jeanine Luz, M.Div..  For questions please contact by phone [phone].

## 2021-03-23 NOTE — ED Notes (Signed)
Chaplain at [facility]

## 2021-03-23 NOTE — ED Notes (Signed)
Placed back in bed, re-updated. Crying, wants her mother. Reassured.

## 2021-03-23 NOTE — ED Notes (Signed)
Continuously reorienting, repositioning, updating, reassurring.

## 2021-03-23 NOTE — ED Notes (Signed)
[facility] called unable to give pick up time 

## 2021-03-23 NOTE — ED Notes (Signed)
Patient is resting comfortably at this time

## 2021-03-23 NOTE — ED Notes (Signed)
No changes. Declined food and drink. Accepted Mango ice cream. Enjoyed reminiscing, and talking about child hood. Calmer.

## 2021-03-23 NOTE — ED Notes (Signed)
Pt escalating, lashing out verbally, upset, yelling. Increasingly difficult to reassure and calm. EDP notified. Pending [facility] arrival.

## 2021-03-23 NOTE — ED Provider Notes (Signed)
[facility] EMERGENCY DEPARTMENT Provider Note   CSN: [id number] Arrival date & time: 03/23/21  1159     History Chief Complaint  Patient presents with   Sarah Gutierrez is a 75 y.o. female.  Unwitnessed fall at her nursing home.  Laceration to the left side of her head.  At her baseline per EMS per facility.  Not on blood thinners.  No extremity pain.  The history is provided by the patient.  Fall This is a new problem. The problem has been resolved. Associated symptoms include headaches. Pertinent negatives include no chest pain, no abdominal pain and no shortness of breath. Nothing aggravates the symptoms. Nothing relieves the symptoms. She has tried nothing for the symptoms.      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   Protein-calorie malnutrition, severe 05/29/2018   Pressure injury of skin 05/29/2018   AKI (acute kidney injury) ([place])    Acute metabolic encephalopathy 19/62/2297   Dehydration 05/20/2018   Hypercalcemia 05/20/2018   Hypernatremia 05/20/2018   Lactic acid acidosis 05/20/2018   Elevated troponin 05/20/2018   Hypertension    Closed displaced fracture of middle phalanx of left ring finger 12/01/2015    Past Surgical History:  Procedure Laterality Date   ABDOMINAL HYSTERECTOMY     BIOPSY  12/14/2020   Procedure: BIOPSY;  Surgeon: Wilford Corner, MD;  Location: [facility];  Service: Endoscopy;;   ESOPHAGOGASTRODUODENOSCOPY (EGD) WITH PROPOFOL N/A 12/14/2020   Procedure: ESOPHAGOGASTRODUODENOSCOPY (EGD) WITH PROPOFOL;  Surgeon: Wilford Corner, MD;  Location: [facility];  Service: Endoscopy;  Laterality: N/A;     OB History   No obstetric history on file.      Family History  Problem Relation Age of Onset   CAD Brother 26       has coronary stent     Social History   Tobacco Use   Smoking status: Never   Smokeless tobacco: Never  Vaping Use   Vaping Use: Never used  Substance Use Topics   Alcohol use: Not Currently    Comment: hx alcohol abuse per family   Drug use: Never    Home Medications Prior to Admission medications   Medication Sig Start Date End Date Taking? Authorizing Provider  acetaminophen (TYLENOL) 325 MG tablet Take 650 mg by mouth every 6 (six) hours as needed for mild pain or moderate pain.    [provider]  amLODipine (NORVASC) 2.5 MG tablet Take 2.5 mg by mouth daily. 01/26/20   [provider]  bisacodyl (DULCOLAX) 10 MG suppository Place 10 mg rectally as needed for moderate constipation.    [provider]  donepezil (ARICEPT) 10 MG tablet Take 1/2 tablet daily for 2 weeks, then increase to 1 tablet daily and continue Patient taking differently: Take 10 mg by mouth at bedtime. 10/23/18   Cameron Sprang, MD  loperamide (IMODIUM) 2 MG capsule Take 2 mg by mouth 3 (three) times daily as needed for diarrhea or loose stools.    [provider]  mineral oil liquid Take 15 mLs by mouth See admin instructions. Take 15 ml by mouth at bedtime twice weekly as needed    [provider]  Multiple  Vitamins-Minerals (MULTIVITAMIN PO) Take 1 tablet by mouth daily.    [provider]  NON FORMULARY Take 2.5 mLs by mouth daily. Tooth and gum tonic use 1/2 teaspoon as directed    [provider]  ondansetron (ZOFRAN) 4 MG tablet Take 4 mg by mouth every 6 (six) hours as needed for nausea or vomiting.    [provider]  polyethylene glycol (MIRALAX / GLYCOLAX) packet Take 17 g by mouth daily. 06/01/18   Dessa Phi, DO  pravastatin (PRAVACHOL) 10 MG tablet Take 10 mg by mouth every evening.    [provider]  QUEtiapine (SEROQUEL) 25 MG tablet  Take 1 tablet (25 mg total) by mouth as needed (May give extra 25 mg tablet once daily as needed for agitation.). Patient taking differently: Take 25 mg by mouth at bedtime. 02/14/19   Charlesetta Shanks, MD  senna-docusate (SENOKOT-S) 8.6-50 MG tablet Take 1 tablet by mouth daily. Patient taking differently: Take 2 tablets by mouth at bedtime. 06/01/18   Dessa Phi, DO  sertraline (ZOLOFT) 50 MG tablet Take 50 mg by mouth daily.    [provider]  thiamine 100 MG tablet Take 100 mg by mouth daily.    [provider]  traZODone (DESYREL) 50 MG tablet Take 1 tablet (50 mg total) by mouth 3 (three) times daily as needed (agitation). 08/04/19   Tegeler, Gwenyth Allegra, MD  triamcinolone cream (KENALOG) 0.1 % Apply 1 application topically 3 (three) times daily.    [provider]    Allergies    Jeanie Cooks allergy]  Review of Systems   Review of Systems  Constitutional:  Negative for chills and fever.  HENT:  Negative for ear pain and sore throat.   Eyes:  Negative for pain and visual disturbance.  Respiratory:  Negative for cough and shortness of breath.   Cardiovascular:  Negative for chest pain and palpitations.  Gastrointestinal:  Negative for abdominal pain and vomiting.  Genitourinary:  Negative for dysuria and hematuria.  Musculoskeletal:  Negative for arthralgias and back pain.  Skin:  Positive for wound. Negative for color change and rash.  Neurological:  Positive for headaches. Negative for seizures and syncope.  All other systems reviewed and are negative.  Physical Exam Updated Vital Signs Pulse (!) 55   Temp 98.4 F (36.9 C) (Oral)   SpO2 100%   Physical Exam Vitals and nursing note reviewed.  Constitutional:      General: She is not in acute distress.    Appearance: She is well-developed. She is not ill-appearing.  HENT:     Nose: Nose normal.  Eyes:     Extraocular Movements: Extraocular movements intact.     Conjunctiva/sclera:  Conjunctivae normal.     Pupils: Pupils are equal, round, and reactive to light.  Cardiovascular:     Rate and Rhythm: Regular rhythm. Bradycardia present.     Pulses: Normal pulses.     Heart sounds: Normal heart sounds. No murmur heard. Pulmonary:     Effort: Pulmonary effort is normal. No respiratory distress.     Breath sounds: Normal breath sounds.  Abdominal:     General: Abdomen is flat.     Palpations: Abdomen is soft.     Tenderness: There is no abdominal tenderness.  Musculoskeletal:     Cervical back: Normal range of motion and neck supple.  Skin:    General: Skin is warm and dry.     Capillary Refill: Capillary refill takes less than 2  seconds.     Comments: 3 cm superficial laceration to the left side of the scalp  Neurological:     General: No focal deficit present.     Mental Status: She is alert. Mental status is at baseline.     Sensory: No sensory deficit.     Motor: No weakness.    ED Results / Procedures / Treatments   Labs (all labs ordered are listed, but only abnormal results are displayed) Labs Reviewed - No data to display  EKG None  Radiology CT Head Wo Contrast  Result Date: 03/23/2021 CLINICAL DATA:  Fall, head trauma EXAM: CT HEAD WITHOUT CONTRAST CT CERVICAL SPINE WITHOUT CONTRAST TECHNIQUE: Multidetector CT imaging of the head and cervical spine was performed following the standard protocol without intravenous contrast. Multiplanar CT image reconstructions of the cervical spine were also generated. COMPARISON:  01/30/2021 FINDINGS: CT HEAD FINDINGS Brain: No evidence of acute infarction, hemorrhage, hydrocephalus, extra-axial collection or mass lesion/mass effect. Periventricular white matter hypodensity. Vascular: No hyperdense vessel or unexpected calcification. Skull: Normal. Negative for fracture or focal lesion. Sinuses/Orbits: No acute finding. Other: None. CT CERVICAL SPINE FINDINGS Alignment: Normal. Skull base and vertebrae: No acute  fracture. No primary bone lesion or focal pathologic process. Soft tissues and spinal canal: No prevertebral fluid or swelling. No visible canal hematoma. Disc levels: Moderate disc space height loss and osteophytosis from C5 through C7. Upper chest: Negative. Other: None. IMPRESSION: 1. No acute intracranial pathology. Small-vessel white matter disease. 2. No fracture or static subluxation of the cervical spine. 3. Moderate disc space height loss and osteophytosis from C5 through C7. Electronically Signed   By: Delanna Ahmadi M.D.   On: 03/23/2021 13:02   CT Cervical Spine Wo Contrast  Result Date: 03/23/2021 CLINICAL DATA:  Fall, head trauma EXAM: CT HEAD WITHOUT CONTRAST CT CERVICAL SPINE WITHOUT CONTRAST TECHNIQUE: Multidetector CT imaging of the head and cervical spine was performed following the standard protocol without intravenous contrast. Multiplanar CT image reconstructions of the cervical spine were also generated. COMPARISON:  01/30/2021 FINDINGS: CT HEAD FINDINGS Brain: No evidence of acute infarction, hemorrhage, hydrocephalus, extra-axial collection or mass lesion/mass effect. Periventricular white matter hypodensity. Vascular: No hyperdense vessel or unexpected calcification. Skull: Normal. Negative for fracture or focal lesion. Sinuses/Orbits: No acute finding. Other: None. CT CERVICAL SPINE FINDINGS Alignment: Normal. Skull base and vertebrae: No acute fracture. No primary bone lesion or focal pathologic process. Soft tissues and spinal canal: No prevertebral fluid or swelling. No visible canal hematoma. Disc levels: Moderate disc space height loss and osteophytosis from C5 through C7. Upper chest: Negative. Other: None. IMPRESSION: 1. No acute intracranial pathology. Small-vessel white matter disease. 2. No fracture or static subluxation of the cervical spine. 3. Moderate disc space height loss and osteophytosis from C5 through C7. Electronically Signed   By: Delanna Ahmadi M.D.   On:  03/23/2021 13:02    Procedures .Marland KitchenLaceration Repair  Date/Time: 03/23/2021 2:19 PM Performed by: Lennice Sites, DO Authorized by: Lennice Sites, DO   Consent:    Consent obtained:  Emergent situation   Consent given by:  Healthcare agent   Risks, benefits, and alternatives were discussed: yes     Risks discussed:  Infection, need for additional repair, nerve damage, pain, poor cosmetic result, poor wound healing, retained foreign body and tendon damage   Alternatives discussed:  No treatment Universal protocol:    Procedure explained and questions answered to patient or proxy's satisfaction: yes     Relevant documents  present and verified: yes     Test results available: yes     Patient identity confirmed:  Arm band Anesthesia:    Anesthesia method:  None Laceration details:    Location:  Scalp   Scalp location:  L temporal   Length (cm):  2   Depth (mm):  1 Pre-procedure details:    Preparation:  Patient was prepped and draped in usual sterile fashion Exploration:    Limited defect created (wound extended): no     Imaging outcome: foreign body not noted     Wound exploration: wound explored through full range of motion     Wound extent: no areolar tissue violation noted, no fascia violation noted, no foreign bodies/material noted, no muscle damage noted, no nerve damage noted, no tendon damage noted, no underlying fracture noted and no vascular damage noted     Contaminated: no   Treatment:    Area cleansed with:  Saline   Amount of cleaning:  Standard   Irrigation solution:  Sterile saline   Irrigation method:  Pressure wash   Visualized foreign bodies/material removed: no   Skin repair:    Repair method:  Staples   Number of staples:  2 Approximation:    Approximation:  Close   Medications Ordered in ED Medications - No data to display  ED Course  I have reviewed the triage vital signs and the nursing notes.  Pertinent labs & imaging results that were available  during my care of the patient were reviewed by me and considered in my medical decision making (see chart for details).    MDM Rules/Calculators/A&P                           ARSHI DUARTE is here with laceration to the left side of her head.  History of memory issues.  Normal vitals.  No fever.  Laceration to the left side of the head that was repaired with staples.  Appears to be at her baseline.  No extremity pain.  Head and neck CT unremarkable.  Is not having any extremity pain or pelvic pain.  Talked with her power of attorney who was made aware of her fall and her injury.  He has follow-up with neurology in place.  Discharged in good condition.  Wound care instructions given.  This chart was dictated using voice recognition software.  Despite best efforts to proofread,  errors can occur which can change the documentation meaning.   Final Clinical Impression(s) / ED Diagnoses Final diagnoses:  Fall, initial encounter  Laceration of scalp, initial encounter    Rx / DC Orders ED Discharge Orders     None        Lennice Sites, DO 03/23/21 1421

## 2021-03-24 ENCOUNTER — Ambulatory Visit (INDEPENDENT_AMBULATORY_CARE_PROVIDER_SITE_OTHER): Payer: Medicare Other | Admitting: Physician Assistant

## 2021-03-24 DIAGNOSIS — F03B18 Unspecified dementia, moderate, with other behavioral disturbance: Secondary | ICD-10-CM

## 2021-03-24 DIAGNOSIS — F04 Amnestic disorder due to known physiological condition: Secondary | ICD-10-CM | POA: Diagnosis not present

## 2021-03-24 DIAGNOSIS — E512 Wernicke's encephalopathy: Secondary | ICD-10-CM | POA: Diagnosis not present

## 2021-03-24 MED ORDER — DONEPEZIL HCL 10 MG PO TABS: 10.0000 mg | ORAL_TABLET | Freq: Every day | ORAL | 11 refills | Status: DC

## 2021-03-24 NOTE — Progress Notes (Signed)
Assessment/Plan:    Moderate dementia with behavioral disturbance Dysarthria     Recommendations:  Continue to monitor mood, continue sertraline, Seroquel, and as needed trazodone Continue donepezil 10 mg daily Side effects were discussed DAT scan to evaluate parkinsonian pathology Check EMG/NCV in view of hyperreflexia.  Her brother will arrange these after work-up for GI issues are finalized Follow up in 3  after results EMG and DAT available    Case discussed with Dr. Delice Lesch who agrees with the plan    Subjective:   ED visits since last seen: 03/23/2021 and 01/03/21 for fall  Hospital admissions: none  Sarah Gutierrez is a very pleasant 75 y.o. RH female with a history of hypertension, remote alcohol abuse with Korsakoff syndrome seen today in follow-up for evaluation of memory loss and worsening dysarthria. She initially presented with acute mental status changes in January 2020 with nystagmus and gait ataxia. Thiamine level low on admission, constellation of symptoms strongly suggestive of Wernicke encephalopathy. In May there was note of change in symptoms with worsening dysarthria. Repeat MRI without contrast done 09/2020 did not show any acute changes. There was moderate cerebral and cerebellar atrophy, minimal chronic microvascular disease. She had other studies including myasthenia panel, vitamin E, B1, anti-GAD antibody, all with negative results.  She remains dysarthric, etiology of worsening dysarthria unclear, possibly related to underlying cerebellar neurodegenerative condition or MSA-C.  She was last seen in our office on 11/16/2020.  This patient is accompanied in the office by her brother, Dr. Orland Mustard who supplements the history.  Previous records as well as any outside records available were reviewed prior to todays visit.  She said to a skilled nursing facility, having physical therapy "does not help much ", and speech therapy.  She has been wheelchair-bound due to  significant ataxia.  She had been unwitnessed fall at her nursing home, hitting the left side of her head, without loss of consciousness.  She continues on daily thiamine 100 mg, donepezil 10 mg daily, sertraline 50 mg daily, Seroquel 25 mg nightly, and as needed trazodone for agitation. She states that her memory is "a little better ", although her brother reports she has issues with comprehension.  She does appear to answer questions appropriately, and may have word finding difficulties, slow to respond coming to think her answers.  During this visit, she denies any headaches.  She denies any dizziness.  She has known numbness in the left wrist.  She denies any bowel or bladder incontinence but the family reports that she wears depends.  She denies stool incontinence.  Her brother reports that she has an appointment in [facility] for GI evaluation,on December 2 due to abnormal endoscopy. She sleeps better with the current regimen.  Her appetite is good, "I like toast ".  In August, she had an episode of trouble swallowing, without recurrence.  I    History on Initial Assessment 07/31/2018: This is a pleasant 75 year old right-handed woman with a history of hypertension, remote alcohol abuse, presenting for evaluation of continued mental status changes. Records from her hospitalization and report from her brother were reviewed, this appears to be quite an acute change for her. She had been living alone independently with family checking in on regularly. She apparently did not answer phone calls since Christmas so her brother called law enforcement on 05/20/2018 for a wellness check. They found her on the couch, non-verbal, following only basic commands. There was half a can of beer  without other alcohol noted. She was non-verbal and not following commands in the ER.  Bloodwork showed sodium of 155, calcium 12, WBC 10.8, lactic acid 5.33, negative UA, EtOH level <10, ammonia 20, CD 53, BUN 58, creatinine 1.68.  Head CT no acute changes. B1 level was low 28.3. 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 She was discharged to rehab mid-January then moved to [facility] because she still has not returned to baseline. Her brother provided additional information that she had been sober for many years but family feels she has recently started drinking beer again within the past 6 months. Her brother found a case of beer in the pantry and found 4-6 empty cans in her living room. Prior to hospitalization, she was cognitively pretty good but not totally normal. She has a Oceanographer in Nutritional therapist. As far as they knew, she had been up to date with bill payments, very active walking 3 miles daily, driving, doing all her yardwork. She has never been very social. Now she does not know what the year or day of the week is, and confabulates quite a bit. He feels she may have had mild confabulation prior. She is aware that she cannot come up with the right words.  He notes she has some nystagmus and tremor that is new, and difficulty with gait and balance. He has not noticed any personality changes, she is very pleasant.    During her visit, she appears to answer questions appropriately but has word-finding difficulties and slow to respond, having to think about her answers. She reports occasional headaches mostly behind her left eye, sometimes affecting her vision. She reports vertical diplopia. She has dizziness when standing. She has numbness over her left wrist. She denies any bowel/bladder incontinence, but family reports she wears Depends. There have been 2 instances where they came to [facility] to find her caked with feces, last week she was on the floor covered with feces (but with a clean diaper). She was previously on Ativan  TID, this was discontinued 3/6 but she has been receiving prn Ativan once a day for agitation and restlessness, wanting to get up. Family has not seen the agitation that SNF describes. They report she is not sleeping and wanting to get up. She is very unsteady, PT has recommended wheelchair only, she would fall back when standing. Family denies any hallucinations. He reports an 11-lb weight loss at the SNF because she was refusing food, after family intervened, this has improved. Strength is better than 6 weeks ago.   PREVIOUS MEDICATIONS:   CURRENT MEDICATIONS:  Outpatient Encounter Medications as of 03/24/2021  Medication Sig   acetaminophen (TYLENOL) 325 MG tablet Take 650 mg by mouth every 6 (six) hours as needed for mild pain or moderate pain.   amLODipine (NORVASC) 10 MG tablet 1 tablet   amLODipine (NORVASC) 2.5 MG tablet Take 2.5 mg by mouth daily.   bisacodyl (DULCOLAX) 10 MG suppository Place 10 mg rectally as needed for moderate constipation.   loperamide (IMODIUM) 2 MG capsule Take 2 mg by mouth 3 (three) times daily as needed for diarrhea or loose stools.   mineral oil liquid Take 15 mLs by mouth See admin instructions. Take 15 ml by mouth at bedtime twice weekly as needed   Multiple Vitamins-Minerals (MULTIVITAMIN PO) Take 1 tablet by mouth daily.  NON FORMULARY Take 2.5 mLs by mouth daily. Tooth and gum tonic use 1/2 teaspoon as directed   ondansetron (ZOFRAN) 4 MG tablet Take 4 mg by mouth every 6 (six) hours as needed for nausea or vomiting.   polyethylene glycol (MIRALAX / GLYCOLAX) packet Take 17 g by mouth daily.   pravastatin (PRAVACHOL) 10 MG tablet Take 10 mg by mouth every evening.   QUEtiapine (SEROQUEL) 25 MG tablet Take 1 tablet (25 mg total) by mouth as needed (May give extra 25 mg tablet once daily as needed for agitation.). (Patient taking differently: Take 25 mg by mouth at bedtime.)   senna-docusate (SENOKOT-S) 8.6-50 MG tablet Take 1 tablet by mouth daily.  (Patient taking differently: Take 2 tablets by mouth at bedtime.)   sertraline (ZOLOFT) 50 MG tablet Take 50 mg by mouth daily.   thiamine 100 MG tablet Take 100 mg by mouth daily.   traZODone (DESYREL) 50 MG tablet Take 1 tablet (50 mg total) by mouth 3 (three) times daily as needed (agitation).   triamcinolone cream (KENALOG) 0.1 % Apply 1 application topically 3 (three) times daily.   [DISCONTINUED] donepezil (ARICEPT) 10 MG tablet Take 1/2 tablet daily for 2 weeks, then increase to 1 tablet daily and continue (Patient taking differently: Take 10 mg by mouth at bedtime.)   donepezil (ARICEPT) 10 MG tablet Take 1 tablet (10 mg total) by mouth at bedtime.   No facility-administered encounter medications on file as of 03/24/2021.     Objective:     PHYSICAL EXAMINATION:    VITALS:  There were no vitals filed for this visit.  GEN:  The patient appears stated age and is in NAD. HEENT:  Normocephalic, atraumatic.   Neurological examination:  General: NAD, well-groomed, appears stated age. Orientation: The patient is alert and awake. Oriented to person, place to date Cranial nerves: There is good facial symmetry. Moderate dysarthria. Fund of knowledge is reduced. Recent and remote memory are impaired. Attention and concentration are normal.  Hearing is intact to conversational tone.    Sensation: Sensation is intact to light touch throughout Motor: Increased tone on B UE, possible cogwheeling with distraction. Decreased finger taps on the L hand. Ataxia on finger to nose and heel to shin testing.  No ankle clonus. Strength is at least antigravity x4. Tremors: none  DTR's 4/4 in UE/ 2/4 LE negative Hoffman sign, negative hyperactive pectoralis reflex. Negative glabellar and jaw jerk reflexes.     No flowsheet data found. MMSE - Mini Mental State Exam 02/04/2020 07/31/2018  Orientation to time 0 0  Orientation to Place 2 3  Registration 3 3  Attention/ Calculation 5 2  Recall 0 0   Language- name 2 objects 2 0  Language- repeat 1 1  Language- follow 3 step command 3 2  Language- read & follow direction 1 1  Write a sentence 1 1  Copy design 0 0  Total score 18 [place] Mental Exam 07/21/2019  Weekday Correct 0  Current year 0  What state are we in? 1  Amount spent 0  Amount left 0  # of Animals 2  5 objects recall 0  Number series 0  Hour markers 2  Time correct 0  Placed X in triangle correctly 1  Largest Figure 1  Name of female 0  Date back to work 0  Type of work 0  State she lived in 0  Total score 7  Total time spent on today's visit was 40 minutes, including both face-to-face time and nonface-to-face time. Time included that spent on review of records (prior notes available to me/labs/imaging if pertinent), discussing treatment and goals, answering patient's questions and coordinating care.  Cc:  [employer] [employer] Sharene Butters, [place]

## 2021-03-24 NOTE — ED Notes (Signed)
Unable to waste 0.5 mg Ativan in the pyxis due to discharge . Waste was verified and witnessed by Regulatory affairs officer  and charge nurse Shidler

## 2021-03-24 NOTE — Patient Instructions (Addendum)
   2. Schedule DATscan  3 EMG /NCS   3. Continue all your medications  4. Follow-up in 3 month with Dr. Delice Lesch

## 2021-03-24 NOTE — ED Notes (Signed)
Unable to waste .5mg  Ativan in pyxis due to patient already being discharged. .5mg  ativan wasted and verified with Illene Silver, Charge RN and Mel Almond, Therapist, sports.

## 2021-05-27 ENCOUNTER — Other Ambulatory Visit: Payer: Self-pay

## 2021-05-27 MED ORDER — DONEPEZIL HCL 10 MG PO TABS: 10.0000 mg | ORAL_TABLET | Freq: Every day | ORAL | 1 refills | Status: DC

## 2021-06-02 DIAGNOSIS — C159 Malignant neoplasm of esophagus, unspecified: Secondary | ICD-10-CM

## 2021-06-02 HISTORY — DX: Malignant neoplasm of esophagus, unspecified: C15.9

## 2021-07-10 ENCOUNTER — Emergency Department (HOSPITAL_COMMUNITY): Payer: Medicare Other

## 2021-07-10 ENCOUNTER — Encounter (HOSPITAL_COMMUNITY): Payer: Self-pay

## 2021-07-10 ENCOUNTER — Emergency Department (HOSPITAL_COMMUNITY)
Admission: EM | Admit: 2021-07-10 | Discharge: 2021-07-11 | Disposition: A | Discharge status: Home / Self Care | Payer: Medicare Other | Attending: Emergency Medicine | Admitting: Emergency Medicine

## 2021-07-10 ENCOUNTER — Other Ambulatory Visit: Payer: Self-pay

## 2021-07-10 DIAGNOSIS — W19XXXA Unspecified fall, initial encounter: Secondary | ICD-10-CM | POA: Insufficient documentation

## 2021-07-10 DIAGNOSIS — F0390 Unspecified dementia without behavioral disturbance: Secondary | ICD-10-CM | POA: Insufficient documentation

## 2021-07-10 DIAGNOSIS — S0081XA Abrasion of other part of head, initial encounter: Secondary | ICD-10-CM | POA: Diagnosis not present

## 2021-07-10 DIAGNOSIS — S0993XA Unspecified injury of face, initial encounter: Secondary | ICD-10-CM | POA: Diagnosis present

## 2021-07-10 LAB — BASIC METABOLIC PANEL
Anion gap: 8 (ref 5–15)
BUN: 19 mg/dL (ref 8–23)
CO2: 27 mmol/L (ref 22–32)
Calcium: 10.1 mg/dL (ref 8.9–10.3)
Chloride: 105 mmol/L (ref 98–111)
Creatinine, Ser: 0.82 mg/dL (ref 0.44–1.00)
GFR, Estimated: >60 mL/min (ref >60)
Glucose, Bld: 104 mg/dL — ABNORMAL HIGH (ref 70–99)
Potassium: 4.3 mmol/L (ref 3.5–5.1)
Sodium: 140 mmol/L (ref 135–145)

## 2021-07-10 LAB — CBC WITH DIFFERENTIAL/PLATELET
Abs Immature Granulocytes: 0.04 10*3/uL (ref 0.00–0.07)
Basophils Absolute: 0.1 10*3/uL (ref 0.0–0.1)
Basophils Relative: 1 %
Eosinophils Absolute: 0 10*3/uL (ref 0.0–0.5)
Eosinophils Relative: 0 %
HCT: 40.5 % (ref 36.0–46.0)
Hemoglobin: 12.8 g/dL (ref 12.0–15.0)
Immature Granulocytes: 0 %
Lymphocytes Relative: 13 %
Lymphs Abs: 1.3 10*3/uL (ref 0.7–4.0)
MCH: 30 pg (ref 26.0–34.0)
MCHC: 31.6 g/dL (ref 30.0–36.0)
MCV: 95.1 fL (ref 80.0–100.0)
Monocytes Absolute: 0.5 10*3/uL (ref 0.1–1.0)
Monocytes Relative: 5 %
Neutro Abs: 8 10*3/uL — ABNORMAL HIGH (ref 1.7–7.7)
Neutrophils Relative %: 81 %
Platelets: 273 10*3/uL (ref 150–400)
RBC: 4.26 MIL/uL (ref 3.87–5.11)
RDW: 13.5 % (ref 11.5–15.5)
WBC: 9.9 10*3/uL (ref 4.0–10.5)
nRBC: 0 % (ref 0.0–0.2)

## 2021-07-10 MED ORDER — QUETIAPINE FUMARATE 25 MG PO TABS
25.0000 mg | ORAL_TABLET | Freq: Once | ORAL | Status: DC
  Filled 2021-07-10: qty 1

## 2021-07-10 MED ORDER — OLANZAPINE 5 MG PO TBDP
5.0000 mg | ORAL_TABLET | Freq: Once | ORAL | Status: AC
  Administered 2021-07-10: 5 mg via ORAL
  Filled 2021-07-10: qty 1

## 2021-07-10 NOTE — ED Notes (Signed)
Called [facility] to transport patient to [facility]

## 2021-07-10 NOTE — ED Notes (Signed)
Patient found sitting on floor at end of the bed. IV removed and blood on floor. Assisted to bed and Dykstra MD notified. New orders to follow.

## 2021-07-10 NOTE — ED Provider Notes (Addendum)
[facility] EMERGENCY DEPARTMENT Provider Note   CSN: [id number] Arrival date & time: 07/10/21  1738     History  Chief Complaint  Patient presents with   Sarah Gutierrez is a 76 y.o. female.  Patient is unable to recall any specific events today to explain the fall.  She denies any acute pain at present.  History is limited due to underlying dementia.  Additional history obtained from chart review, follows with neurology, has history of moderate dementia.  Additional history obtained from patient's healthcare power of attorney, brother, Dr. Harvel Quale.  He states that she at baseline has significant memory and functional issues.  Receives assistance with most of her ADLs by the assisted living facility.  He was with her this afternoon and did not notice any thing out of the ordinary.  This afternoon she was at her baseline mental status.  HPI     Home Medications Prior to Admission medications   Medication Sig Start Date End Date Taking? Authorizing Provider  acetaminophen (TYLENOL) 325 MG tablet Take 650 mg by mouth every 6 (six) hours as needed for mild pain or moderate pain.    [provider]  amLODipine (NORVASC) 10 MG tablet 1 tablet    [provider]  amLODipine (NORVASC) 2.5 MG tablet Take 2.5 mg by mouth daily. 01/26/20   [provider]  bisacodyl (DULCOLAX) 10 MG suppository Place 10 mg rectally as needed for moderate constipation.    [provider]  donepezil (ARICEPT) 10 MG tablet Take 1 tablet (10 mg total) by mouth at bedtime. 05/27/21   Rondel Jumbo, PA-C  loperamide (IMODIUM) 2 MG capsule Take 2 mg by mouth 3 (three) times daily as needed for diarrhea or loose stools.    [provider]  mineral oil liquid Take 15 mLs by mouth See admin instructions. Take 15 ml by mouth at bedtime twice weekly as needed    [provider]  Multiple Vitamins-Minerals (MULTIVITAMIN PO) Take 1 tablet  by mouth daily.    [provider]  NON FORMULARY Take 2.5 mLs by mouth daily. Tooth and gum tonic use 1/2 teaspoon as directed    [provider]  ondansetron (ZOFRAN) 4 MG tablet Take 4 mg by mouth every 6 (six) hours as needed for nausea or vomiting.    [provider]  polyethylene glycol (MIRALAX / GLYCOLAX) packet Take 17 g by mouth daily. 06/01/18   Dessa Phi, DO  pravastatin (PRAVACHOL) 10 MG tablet Take 10 mg by mouth every evening.    [provider]  QUEtiapine (SEROQUEL) 25 MG tablet Take 1 tablet (25 mg total) by mouth as needed (May give extra 25 mg tablet once daily as needed for agitation.). Patient taking differently: Take 25 mg by mouth at bedtime. 02/14/19   Charlesetta Shanks, MD  senna-docusate (SENOKOT-S) 8.6-50 MG tablet Take 1 tablet by mouth daily. Patient taking differently: Take 2 tablets by mouth at bedtime. 06/01/18   Dessa Phi, DO  sertraline (ZOLOFT) 50 MG tablet Take 50 mg by mouth daily.    [provider]  thiamine 100 MG tablet Take 100 mg by mouth daily.    [provider]  traZODone (DESYREL) 50 MG tablet Take 1 tablet (50 mg total) by mouth 3 (three) times daily as needed (agitation). 08/04/19   Tegeler, Gwenyth Allegra, MD  triamcinolone cream (KENALOG) 0.1 % Apply 1 application topically 3 (three) times daily.  [provider]      Allergies    Jeanie Cooks allergy]    Review of Systems   Review of Systems  Unable to perform ROS: Dementia   Physical Exam Updated Vital Signs BP (!) 152/57    Pulse (!) 56    Temp 98.2 F (36.8 C) (Oral)    Resp 15    SpO2 97%  Physical Exam Vitals and nursing note reviewed.  Constitutional:      General: She is not in acute distress.    Appearance: She is well-developed.  HENT:     Head: Normocephalic.     Comments: Facial abrasion, forehead abrasion, no significant laceration or active bleeding noted Eyes:     Conjunctiva/sclera:  Conjunctivae normal.  Cardiovascular:     Rate and Rhythm: Normal rate and regular rhythm.     Heart sounds: No murmur heard. Pulmonary:     Effort: Pulmonary effort is normal. No respiratory distress.     Breath sounds: Normal breath sounds.  Abdominal:     Palpations: Abdomen is soft.     Tenderness: There is no abdominal tenderness.  Musculoskeletal:        General: No swelling.     Cervical back: Neck supple.     Comments: Back: no C, T, L spine TTP, no step off or deformity RUE: no TTP throughout, no deformity, normal joint ROM, radial pulse intact, distal sensation and motor intact LUE: no TTP throughout, no deformity, normal joint ROM, radial pulse intact, distal sensation and motor intact RLE:  no TTP throughout, no deformity, normal joint ROM, distal pulse, sensation and motor intact LLE: no TTP throughout, no deformity, normal joint ROM, distal pulse, sensation and motor intact  Skin:    General: Skin is warm and dry.     Capillary Refill: Capillary refill takes less than 2 seconds.  Neurological:     Mental Status: She is alert.     Comments: Alert, following commands  Psychiatric:        Mood and Affect: Mood normal.    ED Results / Procedures / Treatments   Labs (all labs ordered are listed, but only abnormal results are displayed) Labs Reviewed  CBC WITH DIFFERENTIAL/PLATELET - Abnormal; Notable for the following components:      Result Value   Neutro Abs 8.0 (*)    All other components within normal limits  BASIC METABOLIC PANEL - Abnormal; Notable for the following components:   Glucose, Bld 104 (*)    All other components within normal limits  URINALYSIS, ROUTINE W REFLEX MICROSCOPIC    EKG EKG Interpretation  Date/Time:  Sunday July 10 2021 17:56:50 EST Ventricular Rate:  48 PR Interval:  156 QRS Duration: 75 QT Interval:  462 QTC Calculation: 413 R Axis:   79 Text Interpretation: Sinus bradycardia Confirmed by Madalyn Rob [phone]) on  07/10/2021 7:15:57 PM  Radiology DG Chest 1 View  Result Date: 07/10/2021 CLINICAL DATA:  Unwitnessed fall EXAM: CHEST  1 VIEW COMPARISON:  01/30/2021 FINDINGS: Artifact overlies the chest. Heart size is normal. The lungs are clear. No infiltrate, collapse or effusion. Old healed rib fractures on both sides. IMPRESSION: No active cardiopulmonary disease.  Old healed rib fractures. Electronically Signed   By: Nelson Chimes M.D.   On: 07/10/2021 18:44   DG Pelvis 1-2 Views  Result Date: 07/10/2021 CLINICAL DATA:  Unwitnessed fall EXAM: PELVIS - 1-2 VIEW COMPARISON:  None. FINDINGS: There is no evidence of pelvic fracture or diastasis.  No pelvic bone lesions are seen. Inferior cortical margins of the ischial tuberosities are not included on the film. IMPRESSION: No acute pelvic fracture. Inferior cortical margins of the ischial tuberosities are not included on the film. Electronically Signed   By: Nelson Chimes M.D.   On: 07/10/2021 18:45   CT Head Wo Contrast  Result Date: 07/10/2021 CLINICAL DATA:  Head trauma, moderate-severe.  Unwitnessed fall. EXAM: CT HEAD WITHOUT CONTRAST TECHNIQUE: Contiguous axial images were obtained from the base of the skull through the vertex without intravenous contrast. RADIATION DOSE REDUCTION: This exam was performed according to the departmental dose-optimization program which includes automated exposure control, adjustment of the mA and/or kV according to patient size and/or use of iterative reconstruction technique. COMPARISON:  03/23/2021 FINDINGS: Brain: Generalized atrophy. No sign of old or acute focal infarction, mass lesion, hemorrhage, hydrocephalus or extra-axial collection. Vascular: There is atherosclerotic calcification of the major vessels at the base of the brain. Skull: No skull fracture. Sinuses/Orbits: Small amount of fluid layering in the right division of the sphenoid sinus. Other sinuses clear. Other: Left forehead scalp swelling. IMPRESSION: Left  forehead scalp swelling. No underlying skull fracture. No traumatic intracranial finding. Age related volume loss. Electronically Signed   By: Nelson Chimes M.D.   On: 07/10/2021 18:42   CT Cervical Spine Wo Contrast  Result Date: 07/10/2021 CLINICAL DATA:  Unwitnessed fall.  Trauma to the head and neck. EXAM: CT CERVICAL SPINE WITHOUT CONTRAST TECHNIQUE: Multidetector CT imaging of the cervical spine was performed without intravenous contrast. Multiplanar CT image reconstructions were also generated. RADIATION DOSE REDUCTION: This exam was performed according to the departmental dose-optimization program which includes automated exposure control, adjustment of the mA and/or kV according to patient size and/or use of iterative reconstruction technique. COMPARISON:  03/23/2021 FINDINGS: Alignment: No traumatic malalignment. Straightening of the normal cervical lordosis. Skull base and vertebrae: No fracture or focal lesion. Soft tissues and spinal canal: No traumatic soft tissue finding. Disc levels: Mild spondylosis C3-4, C4-5, C5-6 and C6-7. Bony foraminal narrowing bilaterally at C5-6 and to a lesser extent at C6-7. Facet osteoarthritis at C7-T1. Upper chest: Pleural and parenchymal scarring at the lung apices. Other: None IMPRESSION: No acute or traumatic finding. 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 Electronically Signed   By: Nelson Chimes M.D.   On: 07/10/2021 18:43    Procedures Procedures    Medications Ordered in ED Medications - No data to display  ED Course/ Medical Decision Making/ A&P                           Medical Decision Making Amount and/or Complexity of Data Reviewed Labs: ordered. Radiology: ordered.  Risk Prescription drug management.   76 year old lady presenting to ER from [facility] with concern for unwitnessed fall.  History limited due to dementia, additional history was obtained from EMS report, discussion with  her brother, POA over the phone.  Patient is alert, pleasant, based on discussion with brother suspect she is at her baseline mental status at present.  Noted to have forehead, nose abrasion.  CT head, C-spine negative for acute traumatic pathology.  Independently reviewed CT and XR imaging.  XR chest and pelvis also negative.  Basic lab work stable.  EKG showing normal sinus rhythm, no acute changes noted.  No events on telemetry monitoring.  Remains in sinus rhythm.  Have updated patient's POA/guardian.  Will discharge back to her  facility.  While waiting for placement, patient had increasing agitation.  Ordered dose of Zyprexa ODT.  Sitter to monitor patient.  I was told by RN staff patient had unwitnessed fall, found on ground.  She now is back in bed, do not appreciate any new traumatic findings on physical exam.  She is actually quite calm and pleasant.  Out of an abundance of precaution, will repeat CT head and C-spine.  Have discussed with Dr. Leonette Monarch.  He will follow-up on the results of CT.   Final Clinical Impression(s) / ED Diagnoses Final diagnoses:  Fall, initial encounter  Abrasion of face, initial encounter    Rx / DC Orders ED Discharge Orders     None         Lucrezia Starch, MD 07/10/21 2034    Lucrezia Starch, MD 07/10/21 2317

## 2021-07-10 NOTE — ED Notes (Signed)
Attempted to call [facility] for report. No answer.

## 2021-07-10 NOTE — ED Triage Notes (Signed)
P BIB [facility] from [facility] d/t unwitnessed fall from her w/c, pt reports she was trying to get up without help. She requires w/c & assist with walker at baseline. She has a forehead & nose abrasion, C-collar in place, c/o Left neck pain & Lt thumb pain. Her speech & Lt sided facial droop is at baseline according to the staff of the AL she is from, she is alert & confused at baseline. VSS, CBG 115.

## 2021-07-10 NOTE — Discharge Instructions (Signed)
Keep wounds clean and dry. Follow up with your primary care doctor.

## 2021-07-11 ENCOUNTER — Ambulatory Visit: Payer: Medicare Other | Admitting: Neurology

## 2021-07-11 NOTE — ED Provider Notes (Signed)
I assumed care of this patient.  Please see previous provider note for further details of Hx, PE.  Briefly patient is a 76 y.o. female who presented after an unwitnessed fall in a skilled nursing facility.  Initial work-up was negative.  During her stay patient became agitated resulting in her falling off of the stretcher.  Repeated scans ordered to rule out ICH.  CT head and cervical spine negative. Patient required oral doses of Seroquel and Zyprexa.  She is more cooperative.  Stable for discharge back to skilled nursing facility.  The patient appears reasonably screened and/or stabilized for discharge and I doubt any other medical condition or other [facility] requiring further screening, evaluation, or treatment in the ED at this time prior to discharge. Safe for discharge with strict return precautions.  Disposition: Discharge  Condition: Good  I have discussed the results, Dx and Tx plan with the patient/family who expressed understanding and agree(s) with the plan. Discharge instructions discussed at length. The patient/family was given strict return precautions who verbalized understanding of the instructions. No further questions at time of discharge.    ED Discharge Orders     None        Follow Up: [facility] [place] [address] [place] [address] [phone]  Schedule an appointment as soon as possible for a visit           Stuart Mirabile, Grayce Sessions, MD 07/11/21 [phone]

## 2021-07-11 NOTE — ED Notes (Signed)
[facility] called for report again, no answer

## 2021-07-11 NOTE — ED Notes (Signed)
[facility] called for transport.  

## 2021-07-12 ENCOUNTER — Emergency Department (HOSPITAL_COMMUNITY)
Admission: EM | Admit: 2021-07-12 | Discharge: 2021-07-13 | Disposition: A | Discharge status: Skilled Nursing Facility | Payer: Medicare Other | Attending: Emergency Medicine | Admitting: Emergency Medicine

## 2021-07-12 ENCOUNTER — Emergency Department (HOSPITAL_COMMUNITY): Payer: Medicare Other

## 2021-07-12 DIAGNOSIS — R4781 Slurred speech: Secondary | ICD-10-CM | POA: Insufficient documentation

## 2021-07-12 DIAGNOSIS — W19XXXA Unspecified fall, initial encounter: Secondary | ICD-10-CM | POA: Insufficient documentation

## 2021-07-12 DIAGNOSIS — Z79899 Other long term (current) drug therapy: Secondary | ICD-10-CM | POA: Diagnosis not present

## 2021-07-12 DIAGNOSIS — I10 Essential (primary) hypertension: Secondary | ICD-10-CM | POA: Insufficient documentation

## 2021-07-12 DIAGNOSIS — S0512XD Contusion of eyeball and orbital tissues, left eye, subsequent encounter: Secondary | ICD-10-CM | POA: Diagnosis not present

## 2021-07-12 DIAGNOSIS — M542 Cervicalgia: Secondary | ICD-10-CM | POA: Insufficient documentation

## 2021-07-12 DIAGNOSIS — S0511XD Contusion of eyeball and orbital tissues, right eye, subsequent encounter: Secondary | ICD-10-CM | POA: Diagnosis not present

## 2021-07-12 DIAGNOSIS — Z8501 Personal history of malignant neoplasm of esophagus: Secondary | ICD-10-CM | POA: Insufficient documentation

## 2021-07-12 LAB — BASIC METABOLIC PANEL
Anion gap: 6 (ref 5–15)
BUN: 24 mg/dL — ABNORMAL HIGH (ref 8–23)
CO2: 26 mmol/L (ref 22–32)
Calcium: 10.1 mg/dL (ref 8.9–10.3)
Chloride: 108 mmol/L (ref 98–111)
Creatinine, Ser: 0.97 mg/dL (ref 0.44–1.00)
GFR, Estimated: >60 mL/min (ref >60)
Glucose, Bld: 109 mg/dL — ABNORMAL HIGH (ref 70–99)
Potassium: 3.5 mmol/L (ref 3.5–5.1)
Sodium: 140 mmol/L (ref 135–145)

## 2021-07-12 LAB — CBC WITH DIFFERENTIAL/PLATELET
Abs Immature Granulocytes: 0.01 10*3/uL (ref 0.00–0.07)
Basophils Absolute: 0.1 10*3/uL (ref 0.0–0.1)
Basophils Relative: 1 %
Eosinophils Absolute: 0.1 10*3/uL (ref 0.0–0.5)
Eosinophils Relative: 1 %
HCT: 41.4 % (ref 36.0–46.0)
Hemoglobin: 12.9 g/dL (ref 12.0–15.0)
Immature Granulocytes: 0 %
Lymphocytes Relative: 17 %
Lymphs Abs: 1.1 10*3/uL (ref 0.7–4.0)
MCH: 29.3 pg (ref 26.0–34.0)
MCHC: 31.2 g/dL (ref 30.0–36.0)
MCV: 93.9 fL (ref 80.0–100.0)
Monocytes Absolute: 0.4 10*3/uL (ref 0.1–1.0)
Monocytes Relative: 6 %
Neutro Abs: 4.8 10*3/uL (ref 1.7–7.7)
Neutrophils Relative %: 75 %
Platelets: 279 10*3/uL (ref 150–400)
RBC: 4.41 MIL/uL (ref 3.87–5.11)
RDW: 13.4 % (ref 11.5–15.5)
WBC: 6.4 10*3/uL (ref 4.0–10.5)
nRBC: 0 % (ref 0.0–0.2)

## 2021-07-12 NOTE — ED Triage Notes (Signed)
EMS arrival sent from [facility]. Unwitnessed fall nknown. EMS called at [facility]. Only Cx right side of neck; muscular. AxO3. Able to conversate. Dried blood from previous fall ran by EMS on the 26th here.

## 2021-07-12 NOTE — ED Provider Notes (Signed)
[facility] EMERGENCY DEPARTMENT Provider Note   CSN: [id number] Arrival date & time: 07/12/21  2039     History  Chief Complaint  Patient presents with   Sarah Gutierrez is a 76 y.o. female with a past medical history of hypertension, cognitive impairment, encephalopathy, malignant neoplasm of esophagus.  Presents to the emergency department with a chief complaint of unwitnessed fall.  Due to patient's cognitive impairment level 5 caveat applies.  I spoke with med tech at [facility]. Alwyn Ren, reports that patient had unwitnessed fall at approximately 7:30 PM.  Patient was only down for a few moments before provider found her.  Patient was laying on her right side.  Patient was reported to complain of pain to her neck.  Continue reports that patient is alert to person and place at baseline.  Patient has slurred speech at baseline.  Reports that patient has been having anger outbursts throughout the day which is not usual for her.  Reports that bruising and abrasions to patient's face occurred from fall on 2/26 for which she was evaluated in the emergency department.     Fall      Home Medications Prior to Admission medications   Medication Sig Start Date End Date Taking? Authorizing Provider  acetaminophen (TYLENOL) 325 MG tablet Take 650 mg by mouth every 6 (six) hours as needed for mild pain or moderate pain.    [provider]  amLODipine (NORVASC) 10 MG tablet 1 tablet    [provider]  amLODipine (NORVASC) 2.5 MG tablet Take 2.5 mg by mouth daily. 01/26/20   [provider]  bisacodyl (DULCOLAX) 10 MG suppository Place 10 mg rectally as needed for moderate constipation.    [provider]  donepezil (ARICEPT) 10 MG tablet Take 1 tablet (10 mg total) by mouth at bedtime. 05/27/21   Rondel Jumbo, PA-C  loperamide (IMODIUM) 2 MG capsule Take 2 mg by mouth 3 (three) times daily as needed for diarrhea  or loose stools.    [provider]  mineral oil liquid Take 15 mLs by mouth See admin instructions. Take 15 ml by mouth at bedtime twice weekly as needed    [provider]  Multiple Vitamins-Minerals (MULTIVITAMIN PO) Take 1 tablet by mouth daily.    [provider]  NON FORMULARY Take 2.5 mLs by mouth daily. Tooth and gum tonic use 1/2 teaspoon as directed    [provider]  ondansetron (ZOFRAN) 4 MG tablet Take 4 mg by mouth every 6 (six) hours as needed for nausea or vomiting.    [provider]  polyethylene glycol (MIRALAX / GLYCOLAX) packet Take 17 g by mouth daily. 06/01/18   Dessa Phi, DO  pravastatin (PRAVACHOL) 10 MG tablet Take 10 mg by mouth every evening.    [provider]  QUEtiapine (SEROQUEL) 25 MG tablet Take 1 tablet (25 mg total) by mouth as needed (May give extra 25 mg tablet once daily as needed for agitation.). Patient taking differently: Take 25 mg by mouth at bedtime. 02/14/19   Charlesetta Shanks, MD  senna-docusate (SENOKOT-S) 8.6-50 MG tablet Take 1 tablet by mouth daily. Patient taking differently: Take 2 tablets by mouth at bedtime. 06/01/18   Dessa Phi, DO  sertraline (ZOLOFT) 50 MG tablet Take 50 mg by mouth daily.    [provider]  thiamine 100 MG tablet Take 100 mg by mouth daily.    [provider]  traZODone (DESYREL) 50 MG tablet Take 1 tablet (50 mg total) by mouth 3 (three) times daily as needed (agitation). 08/04/19   Tegeler, Gwenyth Allegra, MD  triamcinolone cream (KENALOG) 0.1 % Apply 1 application topically 3 (three) times daily.    [provider]      Allergies    Jeanie Cooks allergy]    Review of Systems   Review of Systems  Unable to perform ROS: Dementia   Physical Exam Updated Vital Signs BP (!) 153/60    Pulse 66    Temp 98.7 F (37.1 C) (Oral)    Resp 18    SpO2 94%  Physical Exam Vitals and nursing note reviewed.  Constitutional:       General: She is not in acute distress.    Appearance: She is not ill-appearing, toxic-appearing or diaphoretic.  HENT:     Head: Normocephalic. Raccoon eyes and abrasion present. No Battle's sign, contusion, masses or laceration.     Comments: She has ecchymosis around bilateral eyes.  Abrasion to forehead and on bridge of nose Eyes:     General: No scleral icterus.       Right eye: No discharge.        Left eye: No discharge.     Extraocular Movements: Extraocular movements intact.     Conjunctiva/sclera: Conjunctivae normal.     Pupils: Pupils are equal, round, and reactive to light.  Cardiovascular:     Rate and Rhythm: Normal rate.  Pulmonary:     Effort: Pulmonary effort is normal. No tachypnea, bradypnea or prolonged expiration.     Breath sounds: Normal breath sounds. No stridor.  Chest:     Chest wall: No tenderness.  Abdominal:     General: Abdomen is flat. There is no distension. There are no signs of injury.     Palpations: Abdomen is soft. There is no mass or pulsatile mass.     Tenderness: There is no abdominal tenderness. There is no guarding or rebound.  Musculoskeletal:     Cervical back: No edema, erythema, signs of trauma, rigidity, torticollis or crepitus. No pain with movement, spinous process tenderness or muscular tenderness. Normal range of motion.     Comments: No midline tenderness or deformity to cervical, thoracic, or lumbar spine.  No tenderness, bony tenderness, or deformity to bilateral upper or lower extremities.  Pelvis stable.  No leg length discrepancy.  Patient has active range of motion of bilateral upper and lower extremities with no difficulties or complaints of pain  Skin:    General: Skin is warm and dry.  Neurological:     General: No focal deficit present.     Mental Status: She is alert.     GCS: GCS eye subscore is 4. GCS verbal subscore is 5. GCS motor subscore is 6.     Cranial Nerves: Dysarthria present. No facial asymmetry.      Comments: Patient moves all limbs equally without difficulty.  Alert to person and place only.    Psychiatric:        Behavior: Behavior is cooperative.    ED Results / Procedures / Treatments   Labs (all labs ordered are listed, but only abnormal results are displayed) Labs Reviewed  BASIC METABOLIC PANEL - Abnormal; Notable for the following components:      Result Value   Glucose, Bld 109 (*)    BUN 24 (*)    All other components within normal limits  URINE CULTURE  CBC WITH DIFFERENTIAL/PLATELET  URINALYSIS, ROUTINE W REFLEX MICROSCOPIC    EKG None  Radiology DG Ribs Bilateral W/Chest  Result Date: 07/12/2021 CLINICAL DATA:  Fall. EXAM: BILATERAL RIBS AND CHEST - 4+ VIEW COMPARISON:  07/10/2021. FINDINGS: An old rib fracture is noted at T8 on the left at T7 on the right. The heart is enlarged and the mediastinal contour is otherwise within normal limits. No consolidation, effusion, or pneumothorax. Apical pleural thickening is noted bilaterally and unchanged from the previous exam. IMPRESSION: 1. No acute rib fracture. 2. Old healed fractures at T7 on the right and T8 on the left. 3. Cardiomegaly. Electronically Signed   By: Brett Fairy M.D.   On: 07/12/2021 22:44   DG Pelvis 1-2 Views  Result Date: 07/12/2021 CLINICAL DATA:  Fall. EXAM: PELVIS - 1-2 VIEW COMPARISON:  07/10/2021. FINDINGS: There is no evidence of pelvic fracture or diastasis. No pelvic bone lesions are seen. Degenerative changes are noted in the lower lumbar spine and bilateral hips. IMPRESSION: No acute fracture. Electronically Signed   By: Brett Fairy M.D.   On: 07/12/2021 22:46   CT HEAD WO CONTRAST (5MM)  Result Date: 07/12/2021 CLINICAL DATA:  Unwitnessed fall right neck pain. EXAM: CT HEAD WITHOUT CONTRAST CT MAXILLOFACIAL WITHOUT CONTRAST CT CERVICAL SPINE WITHOUT CONTRAST TECHNIQUE: Multidetector CT imaging of the head, cervical spine, and maxillofacial structures were performed using the standard  protocol without intravenous contrast. Multiplanar CT image reconstructions of the cervical spine and maxillofacial structures were also generated. RADIATION DOSE REDUCTION: This exam was performed according to the departmental dose-optimization program which includes automated exposure control, adjustment of the mA and/or kV according to patient size and/or use of iterative reconstruction technique. COMPARISON:  Multiple exams, including 07/10/2021 FINDINGS: CT HEAD FINDINGS Brain: The brainstem, cerebellum, cerebral peduncles, thalami, basal ganglia, basilar cisterns, and ventricular system appear within normal limits. No intracranial hemorrhage, mass lesion, or acute CVA. Vascular: There is atherosclerotic calcification of the cavernous carotid arteries bilaterally. Skull: Unremarkable Other: No supplemental non-categorized findings. CT MAXILLOFACIAL FINDINGS Osseous: No facial fracture identified. Notable periapical lucency along tooth # 20. Orbits: No intraorbital abnormality. Sinuses: Chronic right sphenoid sinusitis. Substantial cerumen in the external auditory canals. Soft tissues: Mild infraorbital soft tissue swelling, right greater than left. CT CERVICAL SPINE FINDINGS Alignment: No vertebral subluxation is observed. Skull base and vertebrae: No cervical spine fracture or acute bony abnormality is observed. Notable degenerative disc disease with loss of disc height at the C5-6 and C6-7 levels. Soft tissues and spinal canal: Unremarkable Disc levels: Stable degenerative disc disease at all cervical levels. Bilateral foraminal impingement at C5-6 due to uncinate spurring. Upper chest: Stable biapical pleuroparenchymal scarring. Other: No supplemental non-categorized findings. IMPRESSION: 1. No acute intracranial, maxillofacial, or cervical spine findings. 2. Periapical lucency along tooth 3. Likely related to tooth decay. 4. Chronic right sphenoid sinusitis. 5. Mild infraorbital soft tissue swelling along  the face, right greater than left, correlate with visual inspection. 6. Cervical spondylosis and degenerative disc disease, causing bilateral foraminal impingement at C5-6. Electronically Signed   By: Van Clines M.D.   On: 07/12/2021 22:08   CT Head Wo Contrast  Result Date: 07/10/2021 CLINICAL DATA:  Head trauma, moderate-severe; Neck trauma (Age >= 65y). Unwitnessed fall, facial abrasions EXAM: CT HEAD WITHOUT CONTRAST CT CERVICAL SPINE WITHOUT CONTRAST TECHNIQUE: Multidetector CT imaging of the head and cervical spine was performed following the standard protocol without intravenous contrast. Multiplanar CT image reconstructions of the cervical spine were also generated. RADIATION DOSE REDUCTION: This  exam was performed according to the departmental dose-optimization program which includes automated exposure control, adjustment of the mA and/or kV according to patient size and/or use of iterative reconstruction technique. COMPARISON:  None. FINDINGS: CT HEAD FINDINGS Brain: Normal anatomic configuration. Parenchymal volume loss is commensurate with the patient's age. Mild periventricular white matter changes are present likely reflecting the sequela of small vessel ischemia. No abnormal intra or extra-axial mass lesion or fluid collection. No abnormal mass effect or midline shift. No evidence of acute intracranial hemorrhage or infarct. Ventricular size is normal. Cerebellum unremarkable. Vascular: No asymmetric hyperdense vasculature at the skull base. Skull: Intact Sinuses/Orbits: Paranasal sinuses are clear. Orbits are unremarkable. Other: Mastoid air cells and middle ear cavities are clear. Moderate frontal scalp soft tissue swelling. CT CERVICAL SPINE FINDINGS Alignment: Reversal of the normal cervical lordosis at C2-C6. No listhesis. Skull base and vertebrae: Craniocervical alignment is normal. The atlantodental interval is not widened. No acute fracture of the cervical spine. Vertebral body  height is preserved. Soft tissues and spinal canal: No prevertebral fluid or swelling. No visible canal hematoma. Disc levels: There is intervertebral disc space narrowing and endplate remodeling throughout the cervical spine, most severe at C5-6 in keeping with changes of mild to moderate degenerative disc disease. Prevertebral soft tissues are not thickened. Spinal canal is widely patent. Review of the axial images demonstrates moderate bilateral neuroforaminal narrowing secondary to uncovertebral arthrosis at C5-6. Upper chest: Unremarkable Other: None IMPRESSION: No acute intracranial abnormality.  No calvarial fracture. No acute fracture or listhesis of the cervical spine. Electronically Signed   By: Fidela Salisbury M.D.   On: 07/10/2021 23:34   CT Cervical Spine Wo Contrast  Result Date: 07/12/2021 CLINICAL DATA:  Unwitnessed fall right neck pain. EXAM: CT HEAD WITHOUT CONTRAST CT MAXILLOFACIAL WITHOUT CONTRAST CT CERVICAL SPINE WITHOUT CONTRAST TECHNIQUE: Multidetector CT imaging of the head, cervical spine, and maxillofacial structures were performed using the standard protocol without intravenous contrast. Multiplanar CT image reconstructions of the cervical spine and maxillofacial structures were also generated. RADIATION DOSE REDUCTION: This exam was performed according to the departmental dose-optimization program which includes automated exposure control, adjustment of the mA and/or kV according to patient size and/or use of iterative reconstruction technique. COMPARISON:  Multiple exams, including 07/10/2021 FINDINGS: CT HEAD FINDINGS Brain: The brainstem, cerebellum, cerebral peduncles, thalami, basal ganglia, basilar cisterns, and ventricular system appear within normal limits. No intracranial hemorrhage, mass lesion, or acute CVA. Vascular: There is atherosclerotic calcification of the cavernous carotid arteries bilaterally. Skull: Unremarkable Other: No supplemental non-categorized findings. CT  MAXILLOFACIAL FINDINGS Osseous: No facial fracture identified. Notable periapical lucency along tooth # 20. Orbits: No intraorbital abnormality. Sinuses: Chronic right sphenoid sinusitis. Substantial cerumen in the external auditory canals. Soft tissues: Mild infraorbital soft tissue swelling, right greater than left. CT CERVICAL SPINE FINDINGS Alignment: No vertebral subluxation is observed. Skull base and vertebrae: No cervical spine fracture or acute bony abnormality is observed. Notable degenerative disc disease with loss of disc height at the C5-6 and C6-7 levels. Soft tissues and spinal canal: Unremarkable Disc levels: Stable degenerative disc disease at all cervical levels. Bilateral foraminal impingement at C5-6 due to uncinate spurring. Upper chest: Stable biapical pleuroparenchymal scarring. Other: No supplemental non-categorized findings. IMPRESSION: 1. No acute intracranial, maxillofacial, or cervical spine findings. 2. Periapical lucency along tooth 3. Likely related to tooth decay. 4. Chronic right sphenoid sinusitis. 5. Mild infraorbital soft tissue swelling along the face, right greater than left, correlate with visual inspection. 6. Cervical  spondylosis and degenerative disc disease, causing bilateral foraminal impingement at C5-6. Electronically Signed   By: Van Clines M.D.   On: 07/12/2021 22:08   CT Cervical Spine Wo Contrast  Result Date: 07/10/2021 CLINICAL DATA:  Head trauma, moderate-severe; Neck trauma (Age >= 65y). Unwitnessed fall, facial abrasions EXAM: CT HEAD WITHOUT CONTRAST CT CERVICAL SPINE WITHOUT CONTRAST TECHNIQUE: Multidetector CT imaging of the head and cervical spine was performed following the standard protocol without intravenous contrast. Multiplanar CT image reconstructions of the cervical spine were also generated. RADIATION DOSE REDUCTION: This exam was performed according to the departmental dose-optimization program which includes automated exposure control,  adjustment of the mA and/or kV according to patient size and/or use of iterative reconstruction technique. COMPARISON:  None. FINDINGS: CT HEAD FINDINGS Brain: Normal anatomic configuration. Parenchymal volume loss is commensurate with the patient's age. Mild periventricular white matter changes are present likely reflecting the sequela of small vessel ischemia. No abnormal intra or extra-axial mass lesion or fluid collection. No abnormal mass effect or midline shift. No evidence of acute intracranial hemorrhage or infarct. Ventricular size is normal. Cerebellum unremarkable. Vascular: No asymmetric hyperdense vasculature at the skull base. Skull: Intact Sinuses/Orbits: Paranasal sinuses are clear. Orbits are unremarkable. Other: Mastoid air cells and middle ear cavities are clear. Moderate frontal scalp soft tissue swelling. CT CERVICAL SPINE FINDINGS Alignment: Reversal of the normal cervical lordosis at C2-C6. No listhesis. Skull base and vertebrae: Craniocervical alignment is normal. The atlantodental interval is not widened. No acute fracture of the cervical spine. Vertebral body height is preserved. Soft tissues and spinal canal: No prevertebral fluid or swelling. No visible canal hematoma. Disc levels: There is intervertebral disc space narrowing and endplate remodeling throughout the cervical spine, most severe at C5-6 in keeping with changes of mild to moderate degenerative disc disease. Prevertebral soft tissues are not thickened. Spinal canal is widely patent. Review of the axial images demonstrates moderate bilateral neuroforaminal narrowing secondary to uncovertebral arthrosis at C5-6. Upper chest: Unremarkable Other: None IMPRESSION: No acute intracranial abnormality.  No calvarial fracture. No acute fracture or listhesis of the cervical spine. Electronically Signed   By: Fidela Salisbury M.D.   On: 07/10/2021 23:34   CT Maxillofacial Wo Contrast  Result Date: 07/12/2021 CLINICAL DATA:  Unwitnessed  fall right neck pain. EXAM: CT HEAD WITHOUT CONTRAST CT MAXILLOFACIAL WITHOUT CONTRAST CT CERVICAL SPINE WITHOUT CONTRAST TECHNIQUE: Multidetector CT imaging of the head, cervical spine, and maxillofacial structures were performed using the standard protocol without intravenous contrast. Multiplanar CT image reconstructions of the cervical spine and maxillofacial structures were also generated. RADIATION DOSE REDUCTION: This exam was performed according to the departmental dose-optimization program which includes automated exposure control, adjustment of the mA and/or kV according to patient size and/or use of iterative reconstruction technique. COMPARISON:  Multiple exams, including 07/10/2021 FINDINGS: CT HEAD FINDINGS Brain: The brainstem, cerebellum, cerebral peduncles, thalami, basal ganglia, basilar cisterns, and ventricular system appear within normal limits. No intracranial hemorrhage, mass lesion, or acute CVA. Vascular: There is atherosclerotic calcification of the cavernous carotid arteries bilaterally. Skull: Unremarkable Other: No supplemental non-categorized findings. CT MAXILLOFACIAL FINDINGS Osseous: No facial fracture identified. Notable periapical lucency along tooth # 20. Orbits: No intraorbital abnormality. Sinuses: Chronic right sphenoid sinusitis. Substantial cerumen in the external auditory canals. Soft tissues: Mild infraorbital soft tissue swelling, right greater than left. CT CERVICAL SPINE FINDINGS Alignment: No vertebral subluxation is observed. Skull base and vertebrae: No cervical spine fracture or acute bony abnormality is observed. Notable degenerative  disc disease with loss of disc height at the C5-6 and C6-7 levels. Soft tissues and spinal canal: Unremarkable Disc levels: Stable degenerative disc disease at all cervical levels. Bilateral foraminal impingement at C5-6 due to uncinate spurring. Upper chest: Stable biapical pleuroparenchymal scarring. Other: No supplemental  non-categorized findings. IMPRESSION: 1. No acute intracranial, maxillofacial, or cervical spine findings. 2. Periapical lucency along tooth 3. Likely related to tooth decay. 4. Chronic right sphenoid sinusitis. 5. Mild infraorbital soft tissue swelling along the face, right greater than left, correlate with visual inspection. 6. Cervical spondylosis and degenerative disc disease, causing bilateral foraminal impingement at C5-6. Electronically Signed   By: Van Clines M.D.   On: 07/12/2021 22:08    Procedures Procedures    Medications Ordered in ED Medications - No data to display  ED Course/ Medical Decision Making/ A&P Clinical Course as of 07/12/21 2349  Tue Jul 12, 2021  2346 I spoke with patient's brother Dr. Orland Mustard who is her medical power of attorney.  He was advised on patient's work-up and plan of care.  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  He has no further concerns at this time [PB]    Clinical Course User Index [PB] Loni Beckwith, PA-C                           Medical Decision Making Amount and/or Complexity of Data Reviewed Labs: ordered. Radiology: ordered.   Alert 76 year old female no acute distress, nontoxic-appearing.  Presents to the ED with a chief complaint of unwitnessed fall.  Patient has history of cognitive impairment.  Level 5 caveat applies.  At baseline patient is alert to person and place.  At baseline patient has slurred speech.  Information was obtained from patient and nurse tech at [facility] assisted living facility.  Past medical records were reviewed including previous provider notes, labs, and imaging.  Patient has medical history as outlined in HPI which complicates her care.  Due to fall in the setting of cognitive impairment will obtain noncontrast CT head, cervical spine, ribs bilaterally, and pelvis.  Will obtain CT imaging of maxillofacial due to ecchymosis and  raccoon eye pattern.  Due to reports of change in patient's behavior while at assisted living will obtain basic lab work and urinalysis.  X-ray imaging of ribs, chest, pelvis were independently viewed and interpreted by myself.  I agree with radiology interpretation of: -No acute pelvis fracture -No acute rib fracture, no active cardiopulmonary disease  CT imaging of head, cervical spine, and maxillofacial showed: -No acute intracranial, maxillofacial, or cervical spine findings.  I personally reviewed and interpreted lab results.  Pertinent findings include: -BMP and CBC reassuring  At time of handoff urinalysis is pending.  Plan to discharge patient pending this result.  Patient care transferred to PA Petrucelli  at the end of my shift. Patient presentation, ED course, and plan of care discussed with review of all pertinent labs and imaging. Please see his/her note for further details regarding further ED course and disposition.          Final Clinical Impression(s) / ED Diagnoses Final diagnoses:  Fall    Rx / DC Orders ED Discharge Orders     None         Dyann Ruddle 07/12/21 2350    Margette Fast, MD 07/14/21 [phone]

## 2021-07-12 NOTE — ED Provider Notes (Signed)
23:30: Assumed care of patient @ shift change pending UA and likely discharge.  Please see prior provider note for full H&P.  Briefly patient w/ hx of dementia had an unwitnessed fall @ facility, had been a bit more agitated with staff therefore sent to the ED.   CT head without ICH and C spine without fx.  Mention of some infraorbital swelling- has bruising there from prior fall.  Chest xray and pelvis xray unremarkable for acute injury.  Labs reassuring.   UA without infection or hematuria.   Patient resting comfortably on re-assessment.  Appears appropriate for discharge.   She does not ambulate @ baseline therefore trial of ambulation was not attempted.    Results for orders placed or performed during the hospital encounter of 07/12/21  Urinalysis, Routine w reflex microscopic Urine, Clean Catch  Result Value Ref Range   Color, Urine YELLOW YELLOW   APPearance CLEAR CLEAR   Specific Gravity, Urine 1.025 1.005 - 1.030   pH 6.0 5.0 - 8.0   Glucose, UA NEGATIVE NEGATIVE mg/dL   Hgb urine dipstick NEGATIVE NEGATIVE   Bilirubin Urine NEGATIVE NEGATIVE   Ketones, ur NEGATIVE NEGATIVE mg/dL   Protein, ur NEGATIVE NEGATIVE mg/dL   Nitrite NEGATIVE NEGATIVE   Leukocytes,Ua NEGATIVE NEGATIVE  Basic metabolic panel  Result Value Ref Range   Sodium 140 135 - 145 mmol/L   Potassium 3.5 3.5 - 5.1 mmol/L   Chloride 108 98 - 111 mmol/L   CO2 26 22 - 32 mmol/L   Glucose, Bld 109 (H) 70 - 99 mg/dL   BUN 24 (H) 8 - 23 mg/dL   Creatinine, Ser 0.97 0.44 - 1.00 mg/dL   Calcium 10.1 8.9 - 10.3 mg/dL   GFR, Estimated >60 >60 mL/min   Anion gap 6 5 - 15  CBC with Differential  Result Value Ref Range   WBC 6.4 4.0 - 10.5 K/uL   RBC 4.41 3.87 - 5.11 MIL/uL   Hemoglobin 12.9 12.0 - 15.0 g/dL   HCT 41.4 36.0 - 46.0 %   MCV 93.9 80.0 - 100.0 fL   MCH 29.3 26.0 - 34.0 pg   MCHC 31.2 30.0 - 36.0 g/dL   RDW 13.4 11.5 - 15.5 %   Platelets 279 150 - 400 K/uL   nRBC 0.0 0.0 - 0.2 %    Neutrophils Relative % 75 %   Neutro Abs 4.8 1.7 - 7.7 K/uL   Lymphocytes Relative 17 %   Lymphs Abs 1.1 0.7 - 4.0 K/uL   Monocytes Relative 6 %   Monocytes Absolute 0.4 0.1 - 1.0 K/uL   Eosinophils Relative 1 %   Eosinophils Absolute 0.1 0.0 - 0.5 K/uL   Basophils Relative 1 %   Basophils Absolute 0.1 0.0 - 0.1 K/uL   Immature Granulocytes 0 %   Abs Immature Granulocytes 0.01 0.00 - 0.07 K/uL   DG Chest 1 View  Result Date: 07/10/2021 CLINICAL DATA:  Unwitnessed fall EXAM: CHEST  1 VIEW COMPARISON:  01/30/2021 FINDINGS: Artifact overlies the chest. Heart size is normal. The lungs are clear. No infiltrate, collapse or effusion. Old healed rib fractures on both sides. IMPRESSION: No active cardiopulmonary disease.  Old healed rib fractures. Electronically Signed   By: Nelson Chimes M.D.   On: 07/10/2021 18:44   DG Ribs Bilateral W/Chest  Result Date: 07/12/2021 CLINICAL DATA:  Fall. EXAM: BILATERAL RIBS AND CHEST - 4+ VIEW COMPARISON:  07/10/2021. FINDINGS: An old rib fracture is noted at T8 on the  left at T7 on the right. The heart is enlarged and the mediastinal contour is otherwise within normal limits. No consolidation, effusion, or pneumothorax. Apical pleural thickening is noted bilaterally and unchanged from the previous exam. IMPRESSION: 1. No acute rib fracture. 2. Old healed fractures at T7 on the right and T8 on the left. 3. Cardiomegaly. Electronically Signed   By: Brett Fairy M.D.   On: 07/12/2021 22:44   DG Pelvis 1-2 Views  Result Date: 07/12/2021 CLINICAL DATA:  Fall. EXAM: PELVIS - 1-2 VIEW COMPARISON:  07/10/2021. FINDINGS: There is no evidence of pelvic fracture or diastasis. No pelvic bone lesions are seen. Degenerative changes are noted in the lower lumbar spine and bilateral hips. IMPRESSION: No acute fracture. Electronically Signed   By: Brett Fairy M.D.   On: 07/12/2021 22:46   DG Pelvis 1-2 Views  Result Date: 07/10/2021 CLINICAL DATA:  Unwitnessed fall EXAM:  PELVIS - 1-2 VIEW COMPARISON:  None. FINDINGS: There is no evidence of pelvic fracture or diastasis. No pelvic bone lesions are seen. Inferior cortical margins of the ischial tuberosities are not included on the film. IMPRESSION: No acute pelvic fracture. Inferior cortical margins of the ischial tuberosities are not included on the film. Electronically Signed   By: Nelson Chimes M.D.   On: 07/10/2021 18:45   CT HEAD WO CONTRAST (5MM)  Result Date: 07/12/2021 CLINICAL DATA:  Unwitnessed fall right neck pain. EXAM: CT HEAD WITHOUT CONTRAST CT MAXILLOFACIAL WITHOUT CONTRAST CT CERVICAL SPINE WITHOUT CONTRAST TECHNIQUE: Multidetector CT imaging of the head, cervical spine, and maxillofacial structures were performed using the standard protocol without intravenous contrast. Multiplanar CT image reconstructions of the cervical spine and maxillofacial structures were also generated. RADIATION DOSE REDUCTION: This exam was performed according to the departmental dose-optimization program which includes automated exposure control, adjustment of the mA and/or kV according to patient size and/or use of iterative reconstruction technique. COMPARISON:  Multiple exams, including 07/10/2021 FINDINGS: CT HEAD FINDINGS Brain: The brainstem, cerebellum, cerebral peduncles, thalami, basal ganglia, basilar cisterns, and ventricular system appear within normal limits. No intracranial hemorrhage, mass lesion, or acute CVA. Vascular: There is atherosclerotic calcification of the cavernous carotid arteries bilaterally. Skull: Unremarkable Other: No supplemental non-categorized findings. CT MAXILLOFACIAL FINDINGS Osseous: No facial fracture identified. Notable periapical lucency along tooth # 20. Orbits: No intraorbital abnormality. Sinuses: Chronic right sphenoid sinusitis. Substantial cerumen in the external auditory canals. Soft tissues: Mild infraorbital soft tissue swelling, right greater than left. CT CERVICAL SPINE FINDINGS  Alignment: No vertebral subluxation is observed. Skull base and vertebrae: No cervical spine fracture or acute bony abnormality is observed. Notable degenerative disc disease with loss of disc height at the C5-6 and C6-7 levels. Soft tissues and spinal canal: Unremarkable Disc levels: Stable degenerative disc disease at all cervical levels. Bilateral foraminal impingement at C5-6 due to uncinate spurring. Upper chest: Stable biapical pleuroparenchymal scarring. Other: No supplemental non-categorized findings. IMPRESSION: 1. No acute intracranial, maxillofacial, or cervical spine findings. 2. Periapical lucency along tooth 3. Likely related to tooth decay. 4. Chronic right sphenoid sinusitis. 5. Mild infraorbital soft tissue swelling along the face, right greater than left, correlate with visual inspection. 6. Cervical spondylosis and degenerative disc disease, causing bilateral foraminal impingement at C5-6. Electronically Signed   By: Van Clines M.D.   On: 07/12/2021 22:08   CT Head Wo Contrast  Result Date: 07/10/2021 CLINICAL DATA:  Head trauma, moderate-severe; Neck trauma (Age >= 65y). Unwitnessed fall, facial abrasions EXAM: CT HEAD WITHOUT CONTRAST CT  CERVICAL SPINE WITHOUT CONTRAST TECHNIQUE: Multidetector CT imaging of the head and cervical spine was performed following the standard protocol without intravenous contrast. Multiplanar CT image reconstructions of the cervical spine were also generated. RADIATION DOSE REDUCTION: This exam was performed according to the departmental dose-optimization program which includes automated exposure control, adjustment of the mA and/or kV according to patient size and/or use of iterative reconstruction technique. COMPARISON:  None. FINDINGS: CT HEAD FINDINGS Brain: Normal anatomic configuration. Parenchymal volume loss is commensurate with the patient's age. Mild periventricular white matter changes are present likely reflecting the sequela of small vessel  ischemia. No abnormal intra or extra-axial mass lesion or fluid collection. No abnormal mass effect or midline shift. No evidence of acute intracranial hemorrhage or infarct. Ventricular size is normal. Cerebellum unremarkable. Vascular: No asymmetric hyperdense vasculature at the skull base. Skull: Intact Sinuses/Orbits: Paranasal sinuses are clear. Orbits are unremarkable. Other: Mastoid air cells and middle ear cavities are clear. Moderate frontal scalp soft tissue swelling. CT CERVICAL SPINE FINDINGS Alignment: Reversal of the normal cervical lordosis at C2-C6. No listhesis. Skull base and vertebrae: Craniocervical alignment is normal. The atlantodental interval is not widened. No acute fracture of the cervical spine. Vertebral body height is preserved. Soft tissues and spinal canal: No prevertebral fluid or swelling. No visible canal hematoma. Disc levels: There is intervertebral disc space narrowing and endplate remodeling throughout the cervical spine, most severe at C5-6 in keeping with changes of mild to moderate degenerative disc disease. Prevertebral soft tissues are not thickened. Spinal canal is widely patent. Review of the axial images demonstrates moderate bilateral neuroforaminal narrowing secondary to uncovertebral arthrosis at C5-6. Upper chest: Unremarkable Other: None IMPRESSION: No acute intracranial abnormality.  No calvarial fracture. No acute fracture or listhesis of the cervical spine. Electronically Signed   By: Fidela Salisbury M.D.   On: 07/10/2021 23:34   CT Head Wo Contrast  Result Date: 07/10/2021 CLINICAL DATA:  Head trauma, moderate-severe.  Unwitnessed fall. EXAM: CT HEAD WITHOUT CONTRAST TECHNIQUE: Contiguous axial images were obtained from the base of the skull through the vertex without intravenous contrast. RADIATION DOSE REDUCTION: This exam was performed according to the departmental dose-optimization program which includes automated exposure control, adjustment of the mA  and/or kV according to patient size and/or use of iterative reconstruction technique. COMPARISON:  03/23/2021 FINDINGS: Brain: Generalized atrophy. No sign of old or acute focal infarction, mass lesion, hemorrhage, hydrocephalus or extra-axial collection. Vascular: There is atherosclerotic calcification of the major vessels at the base of the brain. Skull: No skull fracture. Sinuses/Orbits: Small amount of fluid layering in the right division of the sphenoid sinus. Other sinuses clear. Other: Left forehead scalp swelling. IMPRESSION: Left forehead scalp swelling. No underlying skull fracture. No traumatic intracranial finding. Age related volume loss. Electronically Signed   By: Nelson Chimes M.D.   On: 07/10/2021 18:42   CT Cervical Spine Wo Contrast  Result Date: 07/12/2021 CLINICAL DATA:  Unwitnessed fall right neck pain. EXAM: CT HEAD WITHOUT CONTRAST CT MAXILLOFACIAL WITHOUT CONTRAST CT CERVICAL SPINE WITHOUT CONTRAST TECHNIQUE: Multidetector CT imaging of the head, cervical spine, and maxillofacial structures were performed using the standard protocol without intravenous contrast. Multiplanar CT image reconstructions of the cervical spine and maxillofacial structures were also generated. RADIATION DOSE REDUCTION: This exam was performed according to the departmental dose-optimization program which includes automated exposure control, adjustment of the mA and/or kV according to patient size and/or use of iterative reconstruction technique. COMPARISON:  Multiple exams, including 07/10/2021 FINDINGS: CT  HEAD FINDINGS Brain: The brainstem, cerebellum, cerebral peduncles, thalami, basal ganglia, basilar cisterns, and ventricular system appear within normal limits. No intracranial hemorrhage, mass lesion, or acute CVA. Vascular: There is atherosclerotic calcification of the cavernous carotid arteries bilaterally. Skull: Unremarkable Other: No supplemental non-categorized findings. CT MAXILLOFACIAL FINDINGS  Osseous: No facial fracture identified. Notable periapical lucency along tooth # 20. Orbits: No intraorbital abnormality. Sinuses: Chronic right sphenoid sinusitis. Substantial cerumen in the external auditory canals. Soft tissues: Mild infraorbital soft tissue swelling, right greater than left. CT CERVICAL SPINE FINDINGS Alignment: No vertebral subluxation is observed. Skull base and vertebrae: No cervical spine fracture or acute bony abnormality is observed. Notable degenerative disc disease with loss of disc height at the C5-6 and C6-7 levels. Soft tissues and spinal canal: Unremarkable Disc levels: Stable degenerative disc disease at all cervical levels. Bilateral foraminal impingement at C5-6 due to uncinate spurring. Upper chest: Stable biapical pleuroparenchymal scarring. Other: No supplemental non-categorized findings. IMPRESSION: 1. No acute intracranial, maxillofacial, or cervical spine findings. 2. Periapical lucency along tooth 3. Likely related to tooth decay. 4. Chronic right sphenoid sinusitis. 5. Mild infraorbital soft tissue swelling along the face, right greater than left, correlate with visual inspection. 6. Cervical spondylosis and degenerative disc disease, causing bilateral foraminal impingement at C5-6. Electronically Signed   By: Van Clines M.D.   On: 07/12/2021 22:08   CT Cervical Spine Wo Contrast  Result Date: 07/10/2021 CLINICAL DATA:  Head trauma, moderate-severe; Neck trauma (Age >= 65y). Unwitnessed fall, facial abrasions EXAM: CT HEAD WITHOUT CONTRAST CT CERVICAL SPINE WITHOUT CONTRAST TECHNIQUE: Multidetector CT imaging of the head and cervical spine was performed following the standard protocol without intravenous contrast. Multiplanar CT image reconstructions of the cervical spine were also generated. RADIATION DOSE REDUCTION: This exam was performed according to the departmental dose-optimization program which includes automated exposure control, adjustment of the mA  and/or kV according to patient size and/or use of iterative reconstruction technique. COMPARISON:  None. FINDINGS: CT HEAD FINDINGS Brain: Normal anatomic configuration. Parenchymal volume loss is commensurate with the patient's age. Mild periventricular white matter changes are present likely reflecting the sequela of small vessel ischemia. No abnormal intra or extra-axial mass lesion or fluid collection. No abnormal mass effect or midline shift. No evidence of acute intracranial hemorrhage or infarct. Ventricular size is normal. Cerebellum unremarkable. Vascular: No asymmetric hyperdense vasculature at the skull base. Skull: Intact Sinuses/Orbits: Paranasal sinuses are clear. Orbits are unremarkable. Other: Mastoid air cells and middle ear cavities are clear. Moderate frontal scalp soft tissue swelling. CT CERVICAL SPINE FINDINGS Alignment: Reversal of the normal cervical lordosis at C2-C6. No listhesis. Skull base and vertebrae: Craniocervical alignment is normal. The atlantodental interval is not widened. No acute fracture of the cervical spine. Vertebral body height is preserved. Soft tissues and spinal canal: No prevertebral fluid or swelling. No visible canal hematoma. Disc levels: There is intervertebral disc space narrowing and endplate remodeling throughout the cervical spine, most severe at C5-6 in keeping with changes of mild to moderate degenerative disc disease. Prevertebral soft tissues are not thickened. Spinal canal is widely patent. Review of the axial images demonstrates moderate bilateral neuroforaminal narrowing secondary to uncovertebral arthrosis at C5-6. Upper chest: Unremarkable Other: None IMPRESSION: No acute intracranial abnormality.  No calvarial fracture. No acute fracture or listhesis of the cervical spine. Electronically Signed   By: Fidela Salisbury M.D.   On: 07/10/2021 23:34   CT Cervical Spine Wo Contrast  Result Date: 07/10/2021 CLINICAL DATA:  Unwitnessed  fall.  Trauma to the  head and neck. EXAM: CT CERVICAL SPINE WITHOUT CONTRAST TECHNIQUE: Multidetector CT imaging of the cervical spine was performed without intravenous contrast. Multiplanar CT image reconstructions were also generated. RADIATION DOSE REDUCTION: This exam was performed according to the departmental dose-optimization program which includes automated exposure control, adjustment of the mA and/or kV according to patient size and/or use of iterative reconstruction technique. COMPARISON:  03/23/2021 FINDINGS: Alignment: No traumatic malalignment. Straightening of the normal cervical lordosis. Skull base and vertebrae: No fracture or focal lesion. Soft tissues and spinal canal: No traumatic soft tissue finding. Disc levels: Mild spondylosis C3-4, C4-5, C5-6 and C6-7. Bony foraminal narrowing bilaterally at C5-6 and to a lesser extent at C6-7. Facet osteoarthritis at C7-T1. Upper chest: Pleural and parenchymal scarring at the lung apices. Other: None IMPRESSION: No acute or traumatic finding. 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 Electronically Signed   By: Nelson Chimes M.D.   On: 07/10/2021 18:43   CT Maxillofacial Wo Contrast  Result Date: 07/12/2021 CLINICAL DATA:  Unwitnessed fall right neck pain. EXAM: CT HEAD WITHOUT CONTRAST CT MAXILLOFACIAL WITHOUT CONTRAST CT CERVICAL SPINE WITHOUT CONTRAST TECHNIQUE: Multidetector CT imaging of the head, cervical spine, and maxillofacial structures were performed using the standard protocol without intravenous contrast. Multiplanar CT image reconstructions of the cervical spine and maxillofacial structures were also generated. RADIATION DOSE REDUCTION: This exam was performed according to the departmental dose-optimization program which includes automated exposure control, adjustment of the mA and/or kV according to patient size and/or use of iterative reconstruction technique. COMPARISON:  Multiple exams, including 07/10/2021 FINDINGS: CT HEAD FINDINGS  Brain: The brainstem, cerebellum, cerebral peduncles, thalami, basal ganglia, basilar cisterns, and ventricular system appear within normal limits. No intracranial hemorrhage, mass lesion, or acute CVA. Vascular: There is atherosclerotic calcification of the cavernous carotid arteries bilaterally. Skull: Unremarkable Other: No supplemental non-categorized findings. CT MAXILLOFACIAL FINDINGS Osseous: No facial fracture identified. Notable periapical lucency along tooth # 20. Orbits: No intraorbital abnormality. Sinuses: Chronic right sphenoid sinusitis. Substantial cerumen in the external auditory canals. Soft tissues: Mild infraorbital soft tissue swelling, right greater than left. CT CERVICAL SPINE FINDINGS Alignment: No vertebral subluxation is observed. Skull base and vertebrae: No cervical spine fracture or acute bony abnormality is observed. Notable degenerative disc disease with loss of disc height at the C5-6 and C6-7 levels. Soft tissues and spinal canal: Unremarkable Disc levels: Stable degenerative disc disease at all cervical levels. Bilateral foraminal impingement at C5-6 due to uncinate spurring. Upper chest: Stable biapical pleuroparenchymal scarring. Other: No supplemental non-categorized findings. IMPRESSION: 1. No acute intracranial, maxillofacial, or cervical spine findings. 2. Periapical lucency along tooth 3. Likely related to tooth decay. 4. Chronic right sphenoid sinusitis. 5. Mild infraorbital soft tissue swelling along the face, right greater than left, correlate with visual inspection. 6. Cervical spondylosis and degenerative disc disease, causing bilateral foraminal impingement at C5-6. Electronically Signed   By: Van Clines M.D.   On: 07/12/2021 22:08       Leafy Kindle 07/13/21 1610    Palumbo, April, MD 07/13/21 0300

## 2021-07-13 LAB — URINALYSIS, ROUTINE W REFLEX MICROSCOPIC
Bilirubin Urine: NEGATIVE
Glucose, UA: NEGATIVE mg/dL
Hgb urine dipstick: NEGATIVE
Ketones, ur: NEGATIVE mg/dL
Leukocytes,Ua: NEGATIVE
Nitrite: NEGATIVE
Protein, ur: NEGATIVE mg/dL
Specific Gravity, Urine: 1.025 (ref 1.005–1.030)
pH: 6 (ref 5.0–8.0)

## 2021-07-13 NOTE — Discharge Instructions (Addendum)
Sarah Gutierrez was seen in the ED today after a fall.  Imaging and labs below. CXR showed old rib fractures and cardiomegaly (heart enlargement) but no new fractures.  Pelvis xray did not show fracture.  CT head/neck/face did not show a head bleed or spinal/facial fracture. There were some findings of dental disease and sinus disease on CT as well as some neck degeneration.   Please follow up with primary care within 3 days.  Return to the ED for new or worsening symptoms or any other concerns.   Results for orders placed or performed during the hospital encounter of 07/12/21  Urinalysis, Routine w reflex microscopic Urine, Clean Catch  Result Value Ref Range   Color, Urine YELLOW YELLOW   APPearance CLEAR CLEAR   Specific Gravity, Urine 1.025 1.005 - 1.030   pH 6.0 5.0 - 8.0   Glucose, UA NEGATIVE NEGATIVE mg/dL   Hgb urine dipstick NEGATIVE NEGATIVE   Bilirubin Urine NEGATIVE NEGATIVE   Ketones, ur NEGATIVE NEGATIVE mg/dL   Protein, ur NEGATIVE NEGATIVE mg/dL   Nitrite NEGATIVE NEGATIVE   Leukocytes,Ua NEGATIVE NEGATIVE  Basic metabolic panel  Result Value Ref Range   Sodium 140 135 - 145 mmol/L   Potassium 3.5 3.5 - 5.1 mmol/L   Chloride 108 98 - 111 mmol/L   CO2 26 22 - 32 mmol/L   Glucose, Bld 109 (H) 70 - 99 mg/dL   BUN 24 (H) 8 - 23 mg/dL   Creatinine, Ser 0.97 0.44 - 1.00 mg/dL   Calcium 10.1 8.9 - 10.3 mg/dL   GFR, Estimated >60 >60 mL/min   Anion gap 6 5 - 15  CBC with Differential  Result Value Ref Range   WBC 6.4 4.0 - 10.5 K/uL   RBC 4.41 3.87 - 5.11 MIL/uL   Hemoglobin 12.9 12.0 - 15.0 g/dL   HCT 41.4 36.0 - 46.0 %   MCV 93.9 80.0 - 100.0 fL   MCH 29.3 26.0 - 34.0 pg   MCHC 31.2 30.0 - 36.0 g/dL   RDW 13.4 11.5 - 15.5 %   Platelets 279 150 - 400 K/uL   nRBC 0.0 0.0 - 0.2 %   Neutrophils Relative % 75 %   Neutro Abs 4.8 1.7 - 7.7 K/uL   Lymphocytes Relative 17 %   Lymphs Abs 1.1 0.7 - 4.0 K/uL   Monocytes Relative 6 %   Monocytes Absolute 0.4 0.1 - 1.0 K/uL    Eosinophils Relative 1 %   Eosinophils Absolute 0.1 0.0 - 0.5 K/uL   Basophils Relative 1 %   Basophils Absolute 0.1 0.0 - 0.1 K/uL   Immature Granulocytes 0 %   Abs Immature Granulocytes 0.01 0.00 - 0.07 K/uL   DG Chest 1 View  Result Date: 07/10/2021 CLINICAL DATA:  Unwitnessed fall EXAM: CHEST  1 VIEW COMPARISON:  01/30/2021 FINDINGS: Artifact overlies the chest. Heart size is normal. The lungs are clear. No infiltrate, collapse or effusion. Old healed rib fractures on both sides. IMPRESSION: No active cardiopulmonary disease.  Old healed rib fractures. Electronically Signed   By: Nelson Chimes M.D.   On: 07/10/2021 18:44   DG Ribs Bilateral W/Chest  Result Date: 07/12/2021 CLINICAL DATA:  Fall. EXAM: BILATERAL RIBS AND CHEST - 4+ VIEW COMPARISON:  07/10/2021. FINDINGS: An old rib fracture is noted at T8 on the left at T7 on the right. The heart is enlarged and the mediastinal contour is otherwise within normal limits. No consolidation, effusion, or pneumothorax. Apical pleural thickening is noted  bilaterally and unchanged from the previous exam. IMPRESSION: 1. No acute rib fracture. 2. Old healed fractures at T7 on the right and T8 on the left. 3. Cardiomegaly. Electronically Signed   By: Brett Fairy M.D.   On: 07/12/2021 22:44   DG Pelvis 1-2 Views  Result Date: 07/12/2021 CLINICAL DATA:  Fall. EXAM: PELVIS - 1-2 VIEW COMPARISON:  07/10/2021. FINDINGS: There is no evidence of pelvic fracture or diastasis. No pelvic bone lesions are seen. Degenerative changes are noted in the lower lumbar spine and bilateral hips. IMPRESSION: No acute fracture. Electronically Signed   By: Brett Fairy M.D.   On: 07/12/2021 22:46   DG Pelvis 1-2 Views  Result Date: 07/10/2021 CLINICAL DATA:  Unwitnessed fall EXAM: PELVIS - 1-2 VIEW COMPARISON:  None. FINDINGS: There is no evidence of pelvic fracture or diastasis. No pelvic bone lesions are seen. Inferior cortical margins of the ischial tuberosities are not  included on the film. IMPRESSION: No acute pelvic fracture. Inferior cortical margins of the ischial tuberosities are not included on the film. Electronically Signed   By: Nelson Chimes M.D.   On: 07/10/2021 18:45   CT HEAD WO CONTRAST (5MM)  Result Date: 07/12/2021 CLINICAL DATA:  Unwitnessed fall right neck pain. EXAM: CT HEAD WITHOUT CONTRAST CT MAXILLOFACIAL WITHOUT CONTRAST CT CERVICAL SPINE WITHOUT CONTRAST TECHNIQUE: Multidetector CT imaging of the head, cervical spine, and maxillofacial structures were performed using the standard protocol without intravenous contrast. Multiplanar CT image reconstructions of the cervical spine and maxillofacial structures were also generated. RADIATION DOSE REDUCTION: This exam was performed according to the departmental dose-optimization program which includes automated exposure control, adjustment of the mA and/or kV according to patient size and/or use of iterative reconstruction technique. COMPARISON:  Multiple exams, including 07/10/2021 FINDINGS: CT HEAD FINDINGS Brain: The brainstem, cerebellum, cerebral peduncles, thalami, basal ganglia, basilar cisterns, and ventricular system appear within normal limits. No intracranial hemorrhage, mass lesion, or acute CVA. Vascular: There is atherosclerotic calcification of the cavernous carotid arteries bilaterally. Skull: Unremarkable Other: No supplemental non-categorized findings. CT MAXILLOFACIAL FINDINGS Osseous: No facial fracture identified. Notable periapical lucency along tooth # 20. Orbits: No intraorbital abnormality. Sinuses: Chronic right sphenoid sinusitis. Substantial cerumen in the external auditory canals. Soft tissues: Mild infraorbital soft tissue swelling, right greater than left. CT CERVICAL SPINE FINDINGS Alignment: No vertebral subluxation is observed. Skull base and vertebrae: No cervical spine fracture or acute bony abnormality is observed. Notable degenerative disc disease with loss of disc height at  the C5-6 and C6-7 levels. Soft tissues and spinal canal: Unremarkable Disc levels: Stable degenerative disc disease at all cervical levels. Bilateral foraminal impingement at C5-6 due to uncinate spurring. Upper chest: Stable biapical pleuroparenchymal scarring. Other: No supplemental non-categorized findings. IMPRESSION: 1. No acute intracranial, maxillofacial, or cervical spine findings. 2. Periapical lucency along tooth 3. Likely related to tooth decay. 4. Chronic right sphenoid sinusitis. 5. Mild infraorbital soft tissue swelling along the face, right greater than left, correlate with visual inspection. 6. Cervical spondylosis and degenerative disc disease, causing bilateral foraminal impingement at C5-6. Electronically Signed   By: Van Clines M.D.   On: 07/12/2021 22:08   CT Head Wo Contrast  Result Date: 07/10/2021 CLINICAL DATA:  Head trauma, moderate-severe; Neck trauma (Age >= 65y). Unwitnessed fall, facial abrasions EXAM: CT HEAD WITHOUT CONTRAST CT CERVICAL SPINE WITHOUT CONTRAST TECHNIQUE: Multidetector CT imaging of the head and cervical spine was performed following the standard protocol without intravenous contrast. Multiplanar CT image reconstructions of the  cervical spine were also generated. RADIATION DOSE REDUCTION: This exam was performed according to the departmental dose-optimization program which includes automated exposure control, adjustment of the mA and/or kV according to patient size and/or use of iterative reconstruction technique. COMPARISON:  None. FINDINGS: CT HEAD FINDINGS Brain: Normal anatomic configuration. Parenchymal volume loss is commensurate with the patient's age. Mild periventricular white matter changes are present likely reflecting the sequela of small vessel ischemia. No abnormal intra or extra-axial mass lesion or fluid collection. No abnormal mass effect or midline shift. No evidence of acute intracranial hemorrhage or infarct. Ventricular size is normal.  Cerebellum unremarkable. Vascular: No asymmetric hyperdense vasculature at the skull base. Skull: Intact Sinuses/Orbits: Paranasal sinuses are clear. Orbits are unremarkable. Other: Mastoid air cells and middle ear cavities are clear. Moderate frontal scalp soft tissue swelling. CT CERVICAL SPINE FINDINGS Alignment: Reversal of the normal cervical lordosis at C2-C6. No listhesis. Skull base and vertebrae: Craniocervical alignment is normal. The atlantodental interval is not widened. No acute fracture of the cervical spine. Vertebral body height is preserved. Soft tissues and spinal canal: No prevertebral fluid or swelling. No visible canal hematoma. Disc levels: There is intervertebral disc space narrowing and endplate remodeling throughout the cervical spine, most severe at C5-6 in keeping with changes of mild to moderate degenerative disc disease. Prevertebral soft tissues are not thickened. Spinal canal is widely patent. Review of the axial images demonstrates moderate bilateral neuroforaminal narrowing secondary to uncovertebral arthrosis at C5-6. Upper chest: Unremarkable Other: None IMPRESSION: No acute intracranial abnormality.  No calvarial fracture. No acute fracture or listhesis of the cervical spine. Electronically Signed   By: Fidela Salisbury M.D.   On: 07/10/2021 23:34   CT Head Wo Contrast  Result Date: 07/10/2021 CLINICAL DATA:  Head trauma, moderate-severe.  Unwitnessed fall. EXAM: CT HEAD WITHOUT CONTRAST TECHNIQUE: Contiguous axial images were obtained from the base of the skull through the vertex without intravenous contrast. RADIATION DOSE REDUCTION: This exam was performed according to the departmental dose-optimization program which includes automated exposure control, adjustment of the mA and/or kV according to patient size and/or use of iterative reconstruction technique. COMPARISON:  03/23/2021 FINDINGS: Brain: Generalized atrophy. No sign of old or acute focal infarction, mass lesion,  hemorrhage, hydrocephalus or extra-axial collection. Vascular: There is atherosclerotic calcification of the major vessels at the base of the brain. Skull: No skull fracture. Sinuses/Orbits: Small amount of fluid layering in the right division of the sphenoid sinus. Other sinuses clear. Other: Left forehead scalp swelling. IMPRESSION: Left forehead scalp swelling. No underlying skull fracture. No traumatic intracranial finding. Age related volume loss. Electronically Signed   By: Nelson Chimes M.D.   On: 07/10/2021 18:42   CT Cervical Spine Wo Contrast  Result Date: 07/12/2021 CLINICAL DATA:  Unwitnessed fall right neck pain. EXAM: CT HEAD WITHOUT CONTRAST CT MAXILLOFACIAL WITHOUT CONTRAST CT CERVICAL SPINE WITHOUT CONTRAST TECHNIQUE: Multidetector CT imaging of the head, cervical spine, and maxillofacial structures were performed using the standard protocol without intravenous contrast. Multiplanar CT image reconstructions of the cervical spine and maxillofacial structures were also generated. RADIATION DOSE REDUCTION: This exam was performed according to the departmental dose-optimization program which includes automated exposure control, adjustment of the mA and/or kV according to patient size and/or use of iterative reconstruction technique. COMPARISON:  Multiple exams, including 07/10/2021 FINDINGS: CT HEAD FINDINGS Brain: The brainstem, cerebellum, cerebral peduncles, thalami, basal ganglia, basilar cisterns, and ventricular system appear within normal limits. No intracranial hemorrhage, mass lesion, or acute CVA. Vascular:  There is atherosclerotic calcification of the cavernous carotid arteries bilaterally. Skull: Unremarkable Other: No supplemental non-categorized findings. CT MAXILLOFACIAL FINDINGS Osseous: No facial fracture identified. Notable periapical lucency along tooth # 20. Orbits: No intraorbital abnormality. Sinuses: Chronic right sphenoid sinusitis. Substantial cerumen in the external auditory  canals. Soft tissues: Mild infraorbital soft tissue swelling, right greater than left. CT CERVICAL SPINE FINDINGS Alignment: No vertebral subluxation is observed. Skull base and vertebrae: No cervical spine fracture or acute bony abnormality is observed. Notable degenerative disc disease with loss of disc height at the C5-6 and C6-7 levels. Soft tissues and spinal canal: Unremarkable Disc levels: Stable degenerative disc disease at all cervical levels. Bilateral foraminal impingement at C5-6 due to uncinate spurring. Upper chest: Stable biapical pleuroparenchymal scarring. Other: No supplemental non-categorized findings. IMPRESSION: 1. No acute intracranial, maxillofacial, or cervical spine findings. 2. Periapical lucency along tooth 3. Likely related to tooth decay. 4. Chronic right sphenoid sinusitis. 5. Mild infraorbital soft tissue swelling along the face, right greater than left, correlate with visual inspection. 6. Cervical spondylosis and degenerative disc disease, causing bilateral foraminal impingement at C5-6. Electronically Signed   By: Van Clines M.D.   On: 07/12/2021 22:08   CT Cervical Spine Wo Contrast  Result Date: 07/10/2021 CLINICAL DATA:  Head trauma, moderate-severe; Neck trauma (Age >= 65y). Unwitnessed fall, facial abrasions EXAM: CT HEAD WITHOUT CONTRAST CT CERVICAL SPINE WITHOUT CONTRAST TECHNIQUE: Multidetector CT imaging of the head and cervical spine was performed following the standard protocol without intravenous contrast. Multiplanar CT image reconstructions of the cervical spine were also generated. RADIATION DOSE REDUCTION: This exam was performed according to the departmental dose-optimization program which includes automated exposure control, adjustment of the mA and/or kV according to patient size and/or use of iterative reconstruction technique. COMPARISON:  None. FINDINGS: CT HEAD FINDINGS Brain: Normal anatomic configuration. Parenchymal volume loss is commensurate  with the patient's age. Mild periventricular white matter changes are present likely reflecting the sequela of small vessel ischemia. No abnormal intra or extra-axial mass lesion or fluid collection. No abnormal mass effect or midline shift. No evidence of acute intracranial hemorrhage or infarct. Ventricular size is normal. Cerebellum unremarkable. Vascular: No asymmetric hyperdense vasculature at the skull base. Skull: Intact Sinuses/Orbits: Paranasal sinuses are clear. Orbits are unremarkable. Other: Mastoid air cells and middle ear cavities are clear. Moderate frontal scalp soft tissue swelling. CT CERVICAL SPINE FINDINGS Alignment: Reversal of the normal cervical lordosis at C2-C6. No listhesis. Skull base and vertebrae: Craniocervical alignment is normal. The atlantodental interval is not widened. No acute fracture of the cervical spine. Vertebral body height is preserved. Soft tissues and spinal canal: No prevertebral fluid or swelling. No visible canal hematoma. Disc levels: There is intervertebral disc space narrowing and endplate remodeling throughout the cervical spine, most severe at C5-6 in keeping with changes of mild to moderate degenerative disc disease. Prevertebral soft tissues are not thickened. Spinal canal is widely patent. Review of the axial images demonstrates moderate bilateral neuroforaminal narrowing secondary to uncovertebral arthrosis at C5-6. Upper chest: Unremarkable Other: None IMPRESSION: No acute intracranial abnormality.  No calvarial fracture. No acute fracture or listhesis of the cervical spine. Electronically Signed   By: Fidela Salisbury M.D.   On: 07/10/2021 23:34   CT Cervical Spine Wo Contrast  Result Date: 07/10/2021 CLINICAL DATA:  Unwitnessed fall.  Trauma to the head and neck. EXAM: CT CERVICAL SPINE WITHOUT CONTRAST TECHNIQUE: Multidetector CT imaging of the cervical spine was performed without intravenous contrast. Multiplanar CT  image reconstructions were also  generated. RADIATION DOSE REDUCTION: This exam was performed according to the departmental dose-optimization program which includes automated exposure control, adjustment of the mA and/or kV according to patient size and/or use of iterative reconstruction technique. COMPARISON:  03/23/2021 FINDINGS: Alignment: No traumatic malalignment. Straightening of the normal cervical lordosis. Skull base and vertebrae: No fracture or focal lesion. Soft tissues and spinal canal: No traumatic soft tissue finding. Disc levels: Mild spondylosis C3-4, C4-5, C5-6 and C6-7. Bony foraminal narrowing bilaterally at C5-6 and to a lesser extent at C6-7. Facet osteoarthritis at C7-T1. Upper chest: Pleural and parenchymal scarring at the lung apices. Other: None IMPRESSION: No acute or traumatic finding. 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 Electronically Signed   By: Nelson Chimes M.D.   On: 07/10/2021 18:43   CT Maxillofacial Wo Contrast  Result Date: 07/12/2021 CLINICAL DATA:  Unwitnessed fall right neck pain. EXAM: CT HEAD WITHOUT CONTRAST CT MAXILLOFACIAL WITHOUT CONTRAST CT CERVICAL SPINE WITHOUT CONTRAST TECHNIQUE: Multidetector CT imaging of the head, cervical spine, and maxillofacial structures were performed using the standard protocol without intravenous contrast. Multiplanar CT image reconstructions of the cervical spine and maxillofacial structures were also generated. RADIATION DOSE REDUCTION: This exam was performed according to the departmental dose-optimization program which includes automated exposure control, adjustment of the mA and/or kV according to patient size and/or use of iterative reconstruction technique. COMPARISON:  Multiple exams, including 07/10/2021 FINDINGS: CT HEAD FINDINGS Brain: The brainstem, cerebellum, cerebral peduncles, thalami, basal ganglia, basilar cisterns, and ventricular system appear within normal limits. No intracranial hemorrhage, mass lesion, or acute CVA.  Vascular: There is atherosclerotic calcification of the cavernous carotid arteries bilaterally. Skull: Unremarkable Other: No supplemental non-categorized findings. CT MAXILLOFACIAL FINDINGS Osseous: No facial fracture identified. Notable periapical lucency along tooth # 20. Orbits: No intraorbital abnormality. Sinuses: Chronic right sphenoid sinusitis. Substantial cerumen in the external auditory canals. Soft tissues: Mild infraorbital soft tissue swelling, right greater than left. CT CERVICAL SPINE FINDINGS Alignment: No vertebral subluxation is observed. Skull base and vertebrae: No cervical spine fracture or acute bony abnormality is observed. Notable degenerative disc disease with loss of disc height at the C5-6 and C6-7 levels. Soft tissues and spinal canal: Unremarkable Disc levels: Stable degenerative disc disease at all cervical levels. Bilateral foraminal impingement at C5-6 due to uncinate spurring. Upper chest: Stable biapical pleuroparenchymal scarring. Other: No supplemental non-categorized findings. IMPRESSION: 1. No acute intracranial, maxillofacial, or cervical spine findings. 2. Periapical lucency along tooth 3. Likely related to tooth decay. 4. Chronic right sphenoid sinusitis. 5. Mild infraorbital soft tissue swelling along the face, right greater than left, correlate with visual inspection. 6. Cervical spondylosis and degenerative disc disease, causing bilateral foraminal impingement at C5-6. Electronically Signed   By: Van Clines M.D.   On: 07/12/2021 22:08

## 2021-07-14 LAB — URINE CULTURE: Culture: NO GROWTH

## 2021-08-10 ENCOUNTER — Telehealth: Payer: Self-pay | Admitting: Hematology

## 2021-08-10 NOTE — Telephone Encounter (Signed)
Scheduled appt per 03/29 referral. Pt is aware of appt date and time. Pt is aware to arrive 15 mins prior to appt time and to bring and updated insurance card. Pt is aware of appt location.   ?

## 2021-08-10 NOTE — Progress Notes (Signed)
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 ? ?Biopsies of Esophageal Mass 06/02/2021 ? ? ? ?Past/Anticipated interventions by surgeon, if any:  ? ?Past/Anticipated interventions by medical oncology, if any:  ?Dr. Margaretha Sheffield 08/03/2021 ?RECOMMENDATIONS ?- follow-up completion of staging imaging ?- Discussed case with Dr. Francesca Jewett ?- given patient's PS then likely plan for just palliative RT alone ?- chemoRT also reasonable but defer to patient's wishes ?- RTC in 2 weeks virtually or as needed and locally in [place], [place] ? ? ?Past/Anticipated interventions by Radiation oncology, if any:  ?Dr. Cranston Neighbor / Dr. 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 ?-Ms. Loth, she is not felt to be a good surgical candidate and ultimately her family is unsure if she would tolerate a course of definitive radiation +/- chemotherapy. ?-Given her poor performance status and the family's wishes for a more palliative approach in Ms. Rathke's case, I gave my recommendation to proceed with palliative radiotherapy. ?-Family would like to proceed with treatment in [place]. We made a referral to Dr. Lisbeth Renshaw in [place] for palliative radiation today. ? ? ?Brother states she is normally very talkative.  Her dose of seroquel was recently increased and since then he reports she has been more quiet.  He states she is having some difficulty with hearing. ? ? ?Weight changes, if any: Has lost a few pounds. ? ?Bowel/Bladder complaints, if any: No ? ?Nausea / Vomiting, if any: No ? ?Pain issues, if any:  No ? ?Swallowing:  Brother reports no significant problems with swallowing.  He reports more drooling.  He reports no changes to types of foods she is able to eat.  He reports occasional coughing, mostly associated with eating. ? ?Appetite: Good. ? ?SAFETY ISSUES: ?Prior radiation? No ?Pacemaker/ICD? No ?Possible  current pregnancy? Postmenopausal ?Is the patient on methotrexate? No ? ?Current Complaints/Details: ?-Non verbal ?-Lives at [place] [place] ?

## 2021-08-11 ENCOUNTER — Other Ambulatory Visit: Payer: Self-pay | Admitting: Radiation Oncology

## 2021-08-11 ENCOUNTER — Ambulatory Visit
Admission: RE | Admit: 2021-08-11 | Discharge: 2021-08-11 | Disposition: A | Discharge status: Home / Self Care | Payer: Medicare Other | Source: Ambulatory Visit | Attending: Radiation Oncology | Admitting: Radiation Oncology

## 2021-08-11 ENCOUNTER — Ambulatory Visit
Admission: RE | Admit: 2021-08-11 | Discharge: 2021-08-11 | Disposition: A | Discharge status: Home / Self Care | Payer: Self-pay | Source: Ambulatory Visit | Attending: Radiation Oncology | Admitting: Radiation Oncology

## 2021-08-11 ENCOUNTER — Other Ambulatory Visit: Payer: Self-pay

## 2021-08-11 ENCOUNTER — Encounter: Payer: Self-pay | Admitting: Radiation Oncology

## 2021-08-11 DIAGNOSIS — Z801 Family history of malignant neoplasm of trachea, bronchus and lung: Secondary | ICD-10-CM | POA: Diagnosis not present

## 2021-08-11 DIAGNOSIS — I119 Hypertensive heart disease without heart failure: Secondary | ICD-10-CM | POA: Diagnosis not present

## 2021-08-11 DIAGNOSIS — C159 Malignant neoplasm of esophagus, unspecified: Secondary | ICD-10-CM | POA: Insufficient documentation

## 2021-08-11 DIAGNOSIS — M50322 Other cervical disc degeneration at C5-C6 level: Secondary | ICD-10-CM | POA: Insufficient documentation

## 2021-08-11 DIAGNOSIS — C154 Malignant neoplasm of middle third of esophagus: Secondary | ICD-10-CM

## 2021-08-11 DIAGNOSIS — J323 Chronic sphenoidal sinusitis: Secondary | ICD-10-CM | POA: Insufficient documentation

## 2021-08-11 DIAGNOSIS — G9340 Encephalopathy, unspecified: Secondary | ICD-10-CM | POA: Insufficient documentation

## 2021-08-11 DIAGNOSIS — Z79899 Other long term (current) drug therapy: Secondary | ICD-10-CM | POA: Insufficient documentation

## 2021-08-11 DIAGNOSIS — M47812 Spondylosis without myelopathy or radiculopathy, cervical region: Secondary | ICD-10-CM | POA: Insufficient documentation

## 2021-08-11 DIAGNOSIS — M47816 Spondylosis without myelopathy or radiculopathy, lumbar region: Secondary | ICD-10-CM | POA: Diagnosis not present

## 2021-08-11 NOTE — Progress Notes (Signed)
?Radiation Oncology         [phone] ?________________________________ ? ?Name: Sarah Gutierrez        MRN: [id number]  ?Date of Service: 08/11/2021 DOB: 1945/07/14 ? ?AY:[id number], [facility]  Meda Klinefelter, MD    ? ?REFERRING PHYSICIAN: Meda Klinefelter, MD ? ? ?DIAGNOSIS: The encounter diagnosis was Malignant neoplasm of middle third of esophagus ([place]). ? ? ?HISTORY OF PRESENT ILLNESS: Sarah Gutierrez is a 76 y.o. female seen at the request of Dr. Francesca Jewett at [facility] in [place] for newly diagnosed esophageal cancer.  The patient is nonverbal has resided in a long-term care facility for many years due to [facility] encephalopathy.  Her care has recently been relocated to a different facility.  She has been experiencing longstanding dysphagia,  she had been evaluated in the [facility] health August 2022 when she underwent EGD with Dr. Michail Sermon and at that time segmental moderate mucosal changes characterized by congestion nodularity and ulceration were found in the mid esophagus biopsies were taken, and revealed high-grade dysplasia and squamous mucosal fragments but because the biopsies were official pathology could not rule out an invasive process.  It does not appear that she received any treatment for this or planned treatment.  She has had several emergency room visits in our system with several falls that prompted her evaluation.  Her most recent hospital visit was also for a fall in February 2023.  The patient had also been evaluated at [facility] in January 2023 and underwent endoscopy with biopsy on 06/01/2021 showing squamous mucosa with high-grade dysplasia at the location of nodularity at 30 cm and invasive squamous cell carcinoma moderately differentiated with a background of high-grade dysplasia between 18 to 28 cm in the esophagus.  Have CT chest abdomen and pelvis with contrast on 08/03/2020 which showed thickening circumferentially in the proximal and mid esophagus  within the enhancing focus of soft tissue extending off the anterior aspect of the esophagus measuring 2 cm just to the right of the trachea, there was a nonspecific borderline prominent lymph node in the subcarinal station with bilateral diffuse thickening of the adrenal glands nonspecific and indeterminate.  Multiple subcentimeter mesenteric lymph nodes are slightly more prominent compared to prior imaging that she had had but also felt to be indeterminate.  Otherwise there was no gross evidence of metastatic disease.  She had a small uterus also with enhancing endometrium measuring up to 6 mm.  This finding was also felt to be indeterminate. she was evaluated by the team at [facility] last week, we received a request for referral to our center due to convenience for her care.  With her comorbidities her family is trying to consider options of therapy.  She has already had assessment for them to feel that she is not a good operative candidate, and the team at [facility] talked with them about options of chemoradiation.  Rather at the conclusion of her visit, it was felt that she would be most benefited from palliative radiotherapy closer to home.  She is seen today with her brother to discuss this. ? ? ? ?PREVIOUS RADIATION THERAPY: No ? ? ?PAST MEDICAL HISTORY:  ?Past Medical History:  ?Diagnosis Date  ? Cognitive impairment   ? Encephalopathy   ? Esophagus cancer ([place]) 06/02/2021  ? Hypertension   ?   ? ? ?PAST SURGICAL HISTORY: ?Past Surgical History:  ?Procedure Laterality Date  ? ABDOMINAL HYSTERECTOMY    ? BIOPSY  12/14/2020  ? Procedure: BIOPSY;  Surgeon: Wilford Corner, MD;  Location: [facility];  Service: Endoscopy;;  ? ESOPHAGOGASTRODUODENOSCOPY (EGD) WITH PROPOFOL N/A 12/14/2020  ? Procedure: ESOPHAGOGASTRODUODENOSCOPY (EGD) WITH PROPOFOL;  Surgeon: Wilford Corner, MD;  Location: [facility];  Service: Endoscopy;  Laterality: N/A;  ? ? ? ?FAMILY HISTORY:  ?Family History  ?Problem Relation Age of Onset  ? Throat  cancer Father   ? CAD Brother 71  ?     has coronary stent   ? Mesothelioma Brother   ? ? ? ?SOCIAL HISTORY:  reports that she has never smoked. She has never used smokeless tobacco. She reports that she does not currently use alcohol. She reports that she does not use drugs.  The patient is widowed.  She currently resides at a facility in [place]. She's accompanied by her brother Dr. Orland Mustard. She has an adult daughter who lives in [place].  ? ? ?ALLERGIES: Oyster extract and [employer] allergy] ? ? ?MEDICATIONS:  ?Current Outpatient Medications  ?Medication Sig Dispense Refill  ? acetaminophen (TYLENOL) 325 MG tablet Take 650 mg by mouth every 6 (six) hours as needed for mild pain or moderate pain.    ? bisacodyl (DULCOLAX) 10 MG suppository Place 10 mg rectally as needed for moderate constipation.    ? donepezil (ARICEPT) 10 MG tablet Take 1 tablet (10 mg total) by mouth at bedtime. 90 tablet 1  ? lisinopril (ZESTRIL) 2.5 MG tablet Take 2.5 mg by mouth daily.    ? loperamide (IMODIUM) 2 MG capsule Take 2 mg by mouth 3 (three) times daily as needed for diarrhea or loose stools.    ? mineral oil liquid Take 15 mLs by mouth See admin instructions. Take 15 ml by mouth at bedtime twice weekly as needed    ? Multiple Vitamins-Minerals (MULTIVITAMIN PO) Take 1 tablet by mouth daily.    ? NON FORMULARY Take 2.5 mLs by mouth daily. Tooth and gum tonic use 1/2 teaspoon as directed    ? ondansetron (ZOFRAN) 4 MG tablet Take 4 mg by mouth every 6 (six) hours as needed for nausea or vomiting.    ? polyethylene glycol (MIRALAX / GLYCOLAX) packet Take 17 g by mouth daily. 14 each 0  ? pravastatin (PRAVACHOL) 10 MG tablet Take 10 mg by mouth every evening.    ? QUEtiapine (SEROQUEL) 25 MG tablet Take by mouth.    ? senna-docusate (SENOKOT-S) 8.6-50 MG tablet Take 1 tablet by mouth daily. (Patient taking differently: Take 2 tablets by mouth at bedtime.) 30 tablet 0  ? sertraline (ZOLOFT) 50 MG tablet Take 50 mg by mouth  daily.    ? thiamine 100 MG tablet Take 100 mg by mouth daily.    ? traZODone (DESYREL) 50 MG tablet Take 1 tablet (50 mg total) by mouth 3 (three) times daily as needed (agitation). 30 tablet 0  ? triamcinolone cream (KENALOG) 0.1 % Apply 1 application topically 3 (three) times daily.    ? ?No current facility-administered medications for this encounter.  ? ? ? ?REVIEW OF SYSTEMS: On review of systems, the verbally contributes during our discussion but review of systems is supplied also by her brother.  She has been having dysphagia for months, but is still able to eat most foods and does not have difficulty with soft or liquid foods. No specific compliants of pain are otherwise noted.  ? ?  ? ?PHYSICAL EXAM:  ?Wt Readings from Last 3 Encounters:  ?01/03/21 115 lb 1.3 oz (52.2 kg)  ?12/14/20 115 lb (52.2  kg)  ?11/16/20 130 lb (59 kg)  ? ?Temp Readings from Last 3 Encounters:  ?07/13/21 (!) 97.5 ?F (36.4 ?C) (Oral)  ?07/10/21 98.2 ?F (36.8 ?C) (Oral)  ?03/23/21 97.7 ?F (36.5 ?C) (Oral)  ? ?BP Readings from Last 3 Encounters:  ?08/11/21 (!) 113/49  ?07/13/21 (!) 169/59  ?07/11/21 (!) 107/44  ? ?Pulse Readings from Last 3 Encounters:  ?08/11/21 65  ?07/13/21 (!) 58  ?07/11/21 66  ? ?Pain Assessment ?Pain Score: 0-No pain/10 ? ?In general this is a chronically ill appearing Caucasian female in no acute distress.  She's alert and oriented to person and place. She is appropriate throughout the examination. Cardiopulmonary assessment is negative for acute distress and she exhibits normal effort.  ? ? ? ?ECOG = 3 ? ?0 - Asymptomatic (Fully active, able to carry on all predisease activities without restriction) ? ?1 - Symptomatic but completely ambulatory (Restricted in physically strenuous activity but ambulatory and able to carry out work of a light or sedentary nature. For example, light housework, office work) ? ?2 - Symptomatic, <50% in bed during the day (Ambulatory and capable of all self care but unable to carry out  any work activities. Up and about more than 50% of waking hours) ? ?3 - Symptomatic, >50% in bed, but not bedbound (Capable of only limited self-care, confined to bed or chair 50% or more of waking

## 2021-08-22 ENCOUNTER — Other Ambulatory Visit: Payer: Self-pay

## 2021-08-22 ENCOUNTER — Inpatient Hospital Stay: Payer: Medicare Other | Attending: Hematology | Admitting: Hematology

## 2021-08-22 ENCOUNTER — Ambulatory Visit
Admission: RE | Admit: 2021-08-22 | Discharge: 2021-08-22 | Disposition: A | Discharge status: Home / Self Care | Payer: Medicare Other | Source: Ambulatory Visit | Attending: Radiation Oncology | Admitting: Radiation Oncology

## 2021-08-22 ENCOUNTER — Encounter: Payer: Self-pay | Admitting: Hematology

## 2021-08-22 VITALS — BP 151/51 | HR 59 | Resp 18 | Ht 65.0 in | Wt 117.4 lb

## 2021-08-22 DIAGNOSIS — Z808 Family history of malignant neoplasm of other organs or systems: Secondary | ICD-10-CM | POA: Insufficient documentation

## 2021-08-22 DIAGNOSIS — Z87891 Personal history of nicotine dependence: Secondary | ICD-10-CM | POA: Insufficient documentation

## 2021-08-22 DIAGNOSIS — Z993 Dependence on wheelchair: Secondary | ICD-10-CM | POA: Insufficient documentation

## 2021-08-22 DIAGNOSIS — Z79899 Other long term (current) drug therapy: Secondary | ICD-10-CM | POA: Diagnosis not present

## 2021-08-22 DIAGNOSIS — C159 Malignant neoplasm of esophagus, unspecified: Secondary | ICD-10-CM | POA: Diagnosis not present

## 2021-08-22 DIAGNOSIS — F1011 Alcohol abuse, in remission: Secondary | ICD-10-CM | POA: Insufficient documentation

## 2021-08-22 DIAGNOSIS — Z809 Family history of malignant neoplasm, unspecified: Secondary | ICD-10-CM | POA: Diagnosis not present

## 2021-08-22 DIAGNOSIS — C154 Malignant neoplasm of middle third of esophagus: Secondary | ICD-10-CM | POA: Insufficient documentation

## 2021-08-22 DIAGNOSIS — F04 Amnestic disorder due to known physiological condition: Secondary | ICD-10-CM | POA: Insufficient documentation

## 2021-08-22 DIAGNOSIS — Z510 Encounter for antineoplastic radiation therapy: Secondary | ICD-10-CM | POA: Insufficient documentation

## 2021-08-22 NOTE — Progress Notes (Addendum)
?Sarah Gutierrez   ?Telephone:([phone]) [phone] [phone]   ?Clinic New Consult Note  ? ?Patient Care Team: ?[facility] as PCP - General (Geriatric Medicine) ? ?Date of Service:  08/22/2021  ? ?CHIEF COMPLAINTS/PURPOSE OF CONSULTATION:  ?Esophageal Cancer ? ?REFERRING PHYSICIAN:  ?Dr. Margaretha Sheffield ? ?ASSESSMENT & PLAN:  ?Sarah Gutierrez is a 76 y.o. female with a history of encephalopathy ? ?1. 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 ?-she developed dysphagia in 07/2020. EGD on 12/14/20 by Dr. Michail Sermon showed nodular, ulcerated mucosa in esophagus, biopsy showed no evidence of cancer. ?-repeat EGD on 06/01/21 by Dr. Eston Mould at [facility] again showed nodular, ulcerated mucosa in proximal to mid esophagus, and biopsy pathology revealed invasive squamous cell, moderately differentiated. ?-staging CT CAP on 08/03/21 at [facility] showed, aside from esophageal thickening: nonspecific subcarinal node, bilateral thickening of adrenal glands, and prominent subcentimeter mesenteric lymph nodes; no definitive evidence of metastatic disease. ?-she was seen by Dr. Lisbeth Renshaw on 08/11/21 and underwent CT simulation earlier this afternoon. She is scheduled to receive palliative radiation 09/05/21 - 09/21/21. ?-we discussed standard treatment for esophageal cancer. Given her comorbidities and poor PS, I do not recommend any additional forms of treatment, pressure chemotherapy or immunotherapy. I also do not recommend feeding tube. Her brother Sarah Gutierrez is in agreement. I will see her back once more at the end of radiation ?-I reviewed her recent labs, no anemia or other significant concerns.. ?-She has no significant dysphagia or pain.  We discussed cancer progression related symptoms, and management.  If her symptom gets worse, I recommend hospice care, her brother agrees.  ? ?2. Korsakoff syndrome, history of alcohol abuse and heavy smoking ?-She has significant  cognitive dysfunction and ataxia, spent most time in a wheelchair and bed  ?-she is in SNF now ? ?PLAN:  ?-proceed with palliative radiation as scheduled ?-lab and f/u on last day of radiation (5/10) ? ? ?Oncology History  ?Squamous cell esophageal cancer ([place])  ?08/17/2020 Imaging  ? CLINICAL DATA:  76 year old female with history of dysphagia. ?  ?EXAM: ?ESOPHOGRAM / BARIUM SWALLOW / BARIUM TABLET STUDY ? ?IMPRESSION: ?1. Mild narrowing of the proximal third of the esophagus which does not technically qualify as a stricture based on passage of the 13 mm barium tablet, however, this could be a source for symptoms if the patient does not adequately masticate prior to ingestion of solid foods. ?2. Normal esophageal motility. ?  ?12/14/2020 Procedure  ? Upper GI Endoscopy, Dr. Michail Sermon ? ?Impression: ?- Congested, nodular, ulcerated mucosa in the esophagus. Biopsied. ?- Gastritis. ?- Normal examined duodenum. ?  ?12/14/2020 Initial Biopsy  ? FINAL MICROSCOPIC DIAGNOSIS:  ? ?A. ESOPHAGUS, MID, BIOPSY:  ?- High-grade dysplasia in squamous mucosal fragments.  ?- See comment.  ? ?COMMENT:  ?The biopsies are superficial and I cannot exclude an invasive process.  ?  ?06/01/2021 Procedure  ? Upper GI Endoscopy, Dr. Eston Mould [facility]) ? ?Impression: ?- Nodular ulcerated mucosa in the esophagus. Biopsied.  ?- Normal stomach.  ?- Normal examined duodenum.  ?  ?06/01/2021 Pathology Results  ? Diagnosis    ?A: Esophagus, nodularity at 30 cm, biopsy ?- Squamous mucosa with high-grade dysplasia ?  ?B: Esophagus, 18-28 cm, biopsy ?- Invasive squamous cell carcinoma, moderately differentiated and background high-grade dysplasia  ? ?  ?08/03/2021 Imaging  ? EXAM: CT CHEST W CONTRAST  ?Impression ?1. Consistent with the reported history of esophageal cancer, this study demonstrates  circumferential thickening of the proximal esophagus from the thoracic inlet to the level of the carina. At the level of the aortic arch, there is an approximately 20 x 9 x  6 mm enhancing focus of soft tissue which extends off the anterior aspect of the esophagus, just to the right of the trachea (images 35-45, series 2, image 125 series 6). Note is also made of a nonspecific borderline prominent lymph node within the subcarinal station (image 58 series 2). Otherwise, there are no findings of neoplastic disease within the thorax.  ?2. No significant imaging evidence of aspiration or infectious pneumonia  ?3. Calcified atherosclerotic disease is evident within the coronary arterial distribution.  ? ?EXAM: CT ABDOMEN PELVIS W CONTRAST  ?Impression ?-- A tiny nodular hyperattenuating focus is also identified in the distal esophagus wall anteriorly. Correlation with endoscopy findings is recommended.  ?--Bilateral diffuse thickening of the adrenal glands with mild nodularity which is not specific and indeterminate.. Further assessment with PET/CT could be considered.  ?--Multiple prominent subcentimeter mesenteric lymph nodes which are slightly more prominent compared to prior. This finding is indeterminate and these lymph nodes may also represent reactive lymph nodes due to relative long-term stability. However correlation with PET/CT findings is recommended.  ?--Otherwise, no obvious evidence of metastatic disease in the abdomen and pelvis.  ?--Small uterus with enhancing endometrium with mild prominence measuring up to 0.6 cm. This finding is indeterminate. Attention on follow-up. ?  ?08/11/2021 Initial Diagnosis  ? Squamous cell esophageal cancer ([place]) ?  ?08/22/2021 Cancer Staging  ? Staging form: Esophagus - Squamous Cell Carcinoma, AJCC 8th Edition ?- Clinical stage from 08/22/2021: Stage Unknown (cTX, cN0, cM0, G2) - Signed by Truitt Merle, MD on 08/22/2021 ?Total positive nodes: 0 ?Histologic grading system: 3 grade system ? ?  ? ? ? ?HISTORY OF PRESENTING ILLNESS:  ?Sarah Gutierrez "Sarah Gutierrez" is a 76 y.o. female here because of esophageal cancer. The patient was referred by Dr.  Margaretha Sheffield. The patient presents to the clinic today accompanied by her brother, Sarah Gutierrez. ? ? ?She has a history of encephalopathy back in 2020. She initially developed difficulty swallowing in 07/2020, noticed by her caregivers in the nursing facility where she lives. She underwent barium swallow study on 08/17/20 showing mild narrowing. She proceeded to EGD by Dr. Michail Sermon on 12/14/20 showing congested, nodular, ulcerated mucosa in the esophagus. This was biopsied, but cytology from the procedure was non-diagnostic and showed only high grade dysplasia. ? ?She later underwent repeat EGD by Dr. Eston Mould at [facility] on 06/01/21. This again showed the same nodular, ulcerated mucosa in the esophagus, but this time pathology showed invasive squamous cell carcinoma, moderately differentiated. ? ?Staging CT CAP on 08/03/21 at [facility] showed esophageal thickening, but otherwise several nonspecific findings-- a borderline prominent subcarinal lymph node, bilateral diffuse thickening of adrenal glands with mild nodularity, multiple prominent subcentimeter mesenteric lymph nodes. No definite evidence of metastatic disease. ? ? ?Today the patient notes they felt/feeling prior/after... ?-currently not having issues swallowing but does drool some ?-within limitation of encephalopathy, she reports pain today but doesn't know where. ? ?She has a PMHx of.... ?-encephalopathy ~2020, has short-term memory, speech, and mobility issues (brother reports her speech has worsened but her anxiety issues have lessened over time) ?-brother reports possible hysterectomy, but chart review shows uterus and ovaries visible on recent CT scans. ? ?Socially... ?-she is currently living in a rehab facility. ?-she has a daughter who lives in [place]. She is the guardian of the estate, whereas  her brother is guardian of the person. ? ? ?REVIEW OF SYSTEMS:    ?Constitutional: Denies fevers, chills or abnormal night sweats ?Eyes: Denies blurriness of vision, double vision  or watery eyes ?Ears, nose, mouth, throat, and face: Denies mucositis or sore throat ?Respiratory: Denies cough, dyspnea or wheezes ?Cardiovascular: Denies palpitation, chest discomfort or lower extremity swelling ?[place]

## 2021-08-23 ENCOUNTER — Encounter: Payer: Self-pay | Admitting: Licensed Clinical Social Worker

## 2021-08-23 NOTE — Progress Notes (Signed)
[facility] Clinical Social Work  ?Initial Assessment ? ? ?Sarah Gutierrez is a 76 y.o. year old female accompanied by patient and brother. Clinical Social Work was referred by medical provider for assessment of psychosocial needs.  ? ?SDOH (Social Determinants of Health) assessments performed: Yes ?  ?Distress Screen completed: No ?   ? View : No data to display.  ?  ?  ?  ? ? ? ? ?Family/Social Information:  ?Housing Arrangement: Patient resides in a long term care facility [facility]).  Pt has required long term care for approximately 3 years due to [facility] encephalopathy. ?Family members/support persons in your life? Pt accompanied by her brother Harvel Quale) who is pt's guardian, as well as HCP.  Jenny Reichmann is also a primary care doctor and well versed medically.  Pt's daughter resides in [place], [place] and is patient's financial power of attorney. ?Transportation concerns: no  ?Employment: Retired. Income source: [place] ?Financial concerns: No ?Type of concern: None ?Food access concerns: no ?Religious or spiritual practice: not discussed ?Services Currently in place:  Pt resides in a long term care facility ? ?Coping/ Adjustment to diagnosis: ?Patient understands treatment plan and what happens next? Pt is non-verbal and wheelchair bound due to encephalopathy and did not participate in discussion. ?Concerns about diagnosis and/or treatment:  Pt's brother deciding if he will pursue any additional treatment or pursue a palliative approach. ?Patient reported stressors:  none reported ?Hopes and priorities: priority is to decide GOC based on quality of life to keep patient as comfortable as possible with the highest quality of life. ?Patient enjoys  not identified ?Current coping skills/ strengths: Supportive family/friends  ? ? ? SUMMARY: ?Current SDOH Barriers:  ?No barriers identified at this time. ? ?Clinical Social Work Clinical Goal(s):  ?To provide supportive services as  appropriate/needed. ? ?Interventions: ?Discussed common feeling and emotions when being diagnosed with cancer, and the importance of support during treatment ?Informed patient of the support team roles and support services at [facility] ?Provided CSW contact information and encouraged patient to call with any questions or concerns ?Provided education regarding hospice and palliative care. ? ? ?Follow Up Plan: Patient will contact CSW with any support or resource needs ?Patient verbalizes understanding of plan: Yes ? ? ? ?Henriette Combs, LCSW ?

## 2021-09-01 ENCOUNTER — Ambulatory Visit: Payer: Medicare Other | Admitting: Radiation Oncology

## 2021-09-02 ENCOUNTER — Ambulatory Visit: Payer: Medicare Other

## 2021-09-05 ENCOUNTER — Ambulatory Visit: Payer: Medicare Other

## 2021-09-05 ENCOUNTER — Ambulatory Visit
Admission: RE | Admit: 2021-09-05 | Discharge: 2021-09-05 | Disposition: A | Discharge status: Home / Self Care | Payer: Medicare Other | Source: Ambulatory Visit | Attending: Radiation Oncology | Admitting: Radiation Oncology

## 2021-09-05 ENCOUNTER — Other Ambulatory Visit: Payer: Self-pay

## 2021-09-05 DIAGNOSIS — Z510 Encounter for antineoplastic radiation therapy: Secondary | ICD-10-CM | POA: Diagnosis not present

## 2021-09-05 LAB — RAD ONC ARIA SESSION SUMMARY
Course Elapsed Days: 0
Plan Fractions Treated to Date: 1
Plan Prescribed Dose Per Fraction: 3 Gy
Plan Total Fractions Prescribed: 13
Plan Total Prescribed Dose: 39 Gy
Reference Point Dosage Given to Date: 3 Gy
Reference Point Session Dosage Given: 3 Gy
Session Number: 1

## 2021-09-06 ENCOUNTER — Ambulatory Visit
Admission: RE | Admit: 2021-09-06 | Discharge: 2021-09-06 | Disposition: A | Discharge status: Home / Self Care | Payer: Medicare Other | Source: Ambulatory Visit | Attending: Radiation Oncology | Admitting: Radiation Oncology

## 2021-09-06 ENCOUNTER — Other Ambulatory Visit: Payer: Self-pay

## 2021-09-06 DIAGNOSIS — Z510 Encounter for antineoplastic radiation therapy: Secondary | ICD-10-CM | POA: Diagnosis not present

## 2021-09-06 LAB — RAD ONC ARIA SESSION SUMMARY
Course Elapsed Days: 1
Plan Fractions Treated to Date: 2
Plan Prescribed Dose Per Fraction: 3 Gy
Plan Total Fractions Prescribed: 13
Plan Total Prescribed Dose: 39 Gy
Reference Point Dosage Given to Date: 6 Gy
Reference Point Session Dosage Given: 2.0111 Gy
Session Number: 2

## 2021-09-07 ENCOUNTER — Ambulatory Visit
Admission: RE | Admit: 2021-09-07 | Discharge: 2021-09-07 | Disposition: A | Discharge status: Home / Self Care | Payer: Medicare Other | Source: Ambulatory Visit | Attending: Radiation Oncology | Admitting: Radiation Oncology

## 2021-09-07 ENCOUNTER — Other Ambulatory Visit: Payer: Self-pay

## 2021-09-07 DIAGNOSIS — Z510 Encounter for antineoplastic radiation therapy: Secondary | ICD-10-CM | POA: Diagnosis not present

## 2021-09-07 LAB — RAD ONC ARIA SESSION SUMMARY
Course Elapsed Days: 2
Plan Fractions Treated to Date: 3
Plan Prescribed Dose Per Fraction: 3 Gy
Plan Total Fractions Prescribed: 13
Plan Total Prescribed Dose: 39 Gy
Reference Point Dosage Given to Date: 9 Gy
Reference Point Session Dosage Given: 3 Gy
Session Number: 3

## 2021-09-08 ENCOUNTER — Ambulatory Visit
Admission: RE | Admit: 2021-09-08 | Discharge: 2021-09-08 | Disposition: A | Discharge status: Home / Self Care | Payer: Medicare Other | Source: Ambulatory Visit | Attending: Radiation Oncology | Admitting: Radiation Oncology

## 2021-09-08 ENCOUNTER — Other Ambulatory Visit: Payer: Self-pay

## 2021-09-08 DIAGNOSIS — Z510 Encounter for antineoplastic radiation therapy: Secondary | ICD-10-CM | POA: Diagnosis not present

## 2021-09-08 LAB — RAD ONC ARIA SESSION SUMMARY
Course Elapsed Days: 3
Plan Fractions Treated to Date: 4
Plan Prescribed Dose Per Fraction: 3 Gy
Plan Total Fractions Prescribed: 13
Plan Total Prescribed Dose: 39 Gy
Reference Point Dosage Given to Date: 12 Gy
Reference Point Session Dosage Given: 3 Gy
Session Number: 4

## 2021-09-09 ENCOUNTER — Other Ambulatory Visit: Payer: Self-pay

## 2021-09-09 ENCOUNTER — Ambulatory Visit
Admission: RE | Admit: 2021-09-09 | Discharge: 2021-09-09 | Disposition: A | Discharge status: Home / Self Care | Payer: Medicare Other | Source: Ambulatory Visit | Attending: Radiation Oncology | Admitting: Radiation Oncology

## 2021-09-09 DIAGNOSIS — Z510 Encounter for antineoplastic radiation therapy: Secondary | ICD-10-CM | POA: Diagnosis not present

## 2021-09-09 LAB — RAD ONC ARIA SESSION SUMMARY
Course Elapsed Days: 4
Plan Fractions Treated to Date: 5
Plan Prescribed Dose Per Fraction: 3 Gy
Plan Total Fractions Prescribed: 13
Plan Total Prescribed Dose: 39 Gy
Reference Point Dosage Given to Date: 15 Gy
Reference Point Session Dosage Given: 3 Gy
Session Number: 5

## 2021-09-12 ENCOUNTER — Ambulatory Visit
Admission: RE | Admit: 2021-09-12 | Discharge: 2021-09-12 | Disposition: A | Discharge status: Home / Self Care | Payer: Medicare Other | Source: Ambulatory Visit | Attending: Radiation Oncology | Admitting: Radiation Oncology

## 2021-09-12 ENCOUNTER — Other Ambulatory Visit: Payer: Self-pay

## 2021-09-12 DIAGNOSIS — C154 Malignant neoplasm of middle third of esophagus: Secondary | ICD-10-CM | POA: Insufficient documentation

## 2021-09-12 DIAGNOSIS — Z510 Encounter for antineoplastic radiation therapy: Secondary | ICD-10-CM | POA: Diagnosis present

## 2021-09-12 DIAGNOSIS — F1011 Alcohol abuse, in remission: Secondary | ICD-10-CM | POA: Diagnosis not present

## 2021-09-12 LAB — RAD ONC ARIA SESSION SUMMARY
Course Elapsed Days: 7
Plan Fractions Treated to Date: 6
Plan Prescribed Dose Per Fraction: 3 Gy
Plan Total Fractions Prescribed: 13
Plan Total Prescribed Dose: 39 Gy
Reference Point Dosage Given to Date: 18 Gy
Reference Point Session Dosage Given: 3 Gy
Session Number: 6

## 2021-09-13 ENCOUNTER — Other Ambulatory Visit: Payer: Self-pay

## 2021-09-13 ENCOUNTER — Ambulatory Visit
Admission: RE | Admit: 2021-09-13 | Discharge: 2021-09-13 | Disposition: A | Discharge status: Home / Self Care | Payer: Medicare Other | Source: Ambulatory Visit | Attending: Radiation Oncology | Admitting: Radiation Oncology

## 2021-09-13 DIAGNOSIS — Z510 Encounter for antineoplastic radiation therapy: Secondary | ICD-10-CM | POA: Diagnosis not present

## 2021-09-13 LAB — RAD ONC ARIA SESSION SUMMARY
Course Elapsed Days: 8
Plan Fractions Treated to Date: 7
Plan Prescribed Dose Per Fraction: 3 Gy
Plan Total Fractions Prescribed: 13
Plan Total Prescribed Dose: 39 Gy
Reference Point Dosage Given to Date: 21 Gy
Reference Point Session Dosage Given: 3 Gy
Session Number: 7

## 2021-09-14 ENCOUNTER — Ambulatory Visit
Admission: RE | Admit: 2021-09-14 | Discharge: 2021-09-14 | Disposition: A | Discharge status: Home / Self Care | Payer: Medicare Other | Source: Ambulatory Visit | Attending: Radiation Oncology | Admitting: Radiation Oncology

## 2021-09-14 ENCOUNTER — Other Ambulatory Visit: Payer: Self-pay

## 2021-09-14 DIAGNOSIS — Z510 Encounter for antineoplastic radiation therapy: Secondary | ICD-10-CM | POA: Diagnosis not present

## 2021-09-14 LAB — RAD ONC ARIA SESSION SUMMARY
Course Elapsed Days: 9
Plan Fractions Treated to Date: 8
Plan Prescribed Dose Per Fraction: 3 Gy
Plan Total Fractions Prescribed: 13
Plan Total Prescribed Dose: 39 Gy
Reference Point Dosage Given to Date: 24 Gy
Reference Point Session Dosage Given: 3 Gy
Session Number: 8

## 2021-09-15 ENCOUNTER — Other Ambulatory Visit: Payer: Self-pay

## 2021-09-15 ENCOUNTER — Ambulatory Visit
Admission: RE | Admit: 2021-09-15 | Discharge: 2021-09-15 | Disposition: A | Discharge status: Home / Self Care | Payer: Medicare Other | Source: Ambulatory Visit | Attending: Radiation Oncology | Admitting: Radiation Oncology

## 2021-09-15 DIAGNOSIS — Z510 Encounter for antineoplastic radiation therapy: Secondary | ICD-10-CM | POA: Diagnosis not present

## 2021-09-15 LAB — RAD ONC ARIA SESSION SUMMARY
Course Elapsed Days: 10
Plan Fractions Treated to Date: 9
Plan Prescribed Dose Per Fraction: 3 Gy
Plan Total Fractions Prescribed: 13
Plan Total Prescribed Dose: 39 Gy
Reference Point Dosage Given to Date: 27 Gy
Reference Point Session Dosage Given: 3 Gy
Session Number: 9

## 2021-09-16 ENCOUNTER — Ambulatory Visit
Admission: RE | Admit: 2021-09-16 | Discharge: 2021-09-16 | Disposition: A | Discharge status: Home / Self Care | Payer: Medicare Other | Source: Ambulatory Visit | Attending: Radiation Oncology | Admitting: Radiation Oncology

## 2021-09-16 ENCOUNTER — Other Ambulatory Visit: Payer: Self-pay

## 2021-09-16 DIAGNOSIS — Z510 Encounter for antineoplastic radiation therapy: Secondary | ICD-10-CM | POA: Diagnosis not present

## 2021-09-16 LAB — RAD ONC ARIA SESSION SUMMARY
Course Elapsed Days: 11
Plan Fractions Treated to Date: 10
Plan Prescribed Dose Per Fraction: 3 Gy
Plan Total Fractions Prescribed: 13
Plan Total Prescribed Dose: 39 Gy
Reference Point Dosage Given to Date: 30 Gy
Reference Point Session Dosage Given: 3 Gy
Session Number: 10

## 2021-09-19 ENCOUNTER — Other Ambulatory Visit: Payer: Self-pay

## 2021-09-19 ENCOUNTER — Ambulatory Visit
Admission: RE | Admit: 2021-09-19 | Discharge: 2021-09-19 | Disposition: A | Discharge status: Home / Self Care | Payer: Medicare Other | Source: Ambulatory Visit | Attending: Radiation Oncology | Admitting: Radiation Oncology

## 2021-09-19 ENCOUNTER — Ambulatory Visit: Payer: Medicare Other

## 2021-09-19 DIAGNOSIS — Z510 Encounter for antineoplastic radiation therapy: Secondary | ICD-10-CM | POA: Diagnosis not present

## 2021-09-19 LAB — RAD ONC ARIA SESSION SUMMARY
Course Elapsed Days: 14
Plan Fractions Treated to Date: 11
Plan Prescribed Dose Per Fraction: 3 Gy
Plan Total Fractions Prescribed: 13
Plan Total Prescribed Dose: 39 Gy
Reference Point Dosage Given to Date: 33 Gy
Reference Point Session Dosage Given: 3 Gy
Session Number: 11

## 2021-09-20 ENCOUNTER — Other Ambulatory Visit: Payer: Self-pay

## 2021-09-20 ENCOUNTER — Ambulatory Visit
Admission: RE | Admit: 2021-09-20 | Discharge: 2021-09-20 | Disposition: A | Discharge status: Home / Self Care | Payer: Medicare Other | Source: Ambulatory Visit | Attending: Radiation Oncology | Admitting: Radiation Oncology

## 2021-09-20 DIAGNOSIS — Z510 Encounter for antineoplastic radiation therapy: Secondary | ICD-10-CM | POA: Diagnosis not present

## 2021-09-20 LAB — RAD ONC ARIA SESSION SUMMARY
Course Elapsed Days: 15
Plan Fractions Treated to Date: 12
Plan Prescribed Dose Per Fraction: 3 Gy
Plan Total Fractions Prescribed: 13
Plan Total Prescribed Dose: 39 Gy
Reference Point Dosage Given to Date: 36 Gy
Reference Point Session Dosage Given: 3 Gy
Session Number: 12

## 2021-09-21 ENCOUNTER — Other Ambulatory Visit: Payer: Self-pay

## 2021-09-21 ENCOUNTER — Inpatient Hospital Stay: Payer: Medicare Other

## 2021-09-21 ENCOUNTER — Encounter: Payer: Self-pay | Admitting: Hematology

## 2021-09-21 ENCOUNTER — Inpatient Hospital Stay (HOSPITAL_BASED_OUTPATIENT_CLINIC_OR_DEPARTMENT_OTHER): Payer: Medicare Other | Admitting: Hematology

## 2021-09-21 ENCOUNTER — Ambulatory Visit
Admission: RE | Admit: 2021-09-21 | Discharge: 2021-09-21 | Disposition: A | Discharge status: Home / Self Care | Payer: Medicare Other | Source: Ambulatory Visit | Attending: Radiation Oncology | Admitting: Radiation Oncology

## 2021-09-21 ENCOUNTER — Encounter: Payer: Self-pay | Admitting: Radiation Oncology

## 2021-09-21 VITALS — BP 151/62 | HR 76 | Temp 97.7°F | Resp 16 | Ht 65.0 in | Wt 112.4 lb

## 2021-09-21 DIAGNOSIS — C159 Malignant neoplasm of esophagus, unspecified: Secondary | ICD-10-CM

## 2021-09-21 DIAGNOSIS — F04 Amnestic disorder due to known physiological condition: Secondary | ICD-10-CM | POA: Insufficient documentation

## 2021-09-21 DIAGNOSIS — F1011 Alcohol abuse, in remission: Secondary | ICD-10-CM | POA: Insufficient documentation

## 2021-09-21 DIAGNOSIS — Z79899 Other long term (current) drug therapy: Secondary | ICD-10-CM | POA: Insufficient documentation

## 2021-09-21 DIAGNOSIS — C154 Malignant neoplasm of middle third of esophagus: Secondary | ICD-10-CM | POA: Insufficient documentation

## 2021-09-21 DIAGNOSIS — Z510 Encounter for antineoplastic radiation therapy: Secondary | ICD-10-CM | POA: Diagnosis not present

## 2021-09-21 DIAGNOSIS — Z923 Personal history of irradiation: Secondary | ICD-10-CM | POA: Insufficient documentation

## 2021-09-21 LAB — RAD ONC ARIA SESSION SUMMARY
Course Elapsed Days: 16
Plan Fractions Treated to Date: 13
Plan Prescribed Dose Per Fraction: 3 Gy
Plan Total Fractions Prescribed: 13
Plan Total Prescribed Dose: 39 Gy
Reference Point Dosage Given to Date: 39 Gy
Reference Point Session Dosage Given: 3 Gy
Session Number: 13

## 2021-09-21 LAB — CMP (CANCER CENTER ONLY)
ALT: 9 U/L (ref 0–44)
AST: 12 U/L — ABNORMAL LOW (ref 15–41)
Albumin: 4 g/dL (ref 3.5–5.0)
Alkaline Phosphatase: 73 U/L (ref 38–126)
Anion gap: 5 (ref 5–15)
BUN: 20 mg/dL (ref 8–23)
CO2: 29 mmol/L (ref 22–32)
Calcium: 9.9 mg/dL (ref 8.9–10.3)
Chloride: 110 mmol/L (ref 98–111)
Creatinine: 0.78 mg/dL (ref 0.44–1.00)
GFR, Estimated: >60 mL/min (ref >60)
Glucose, Bld: 121 mg/dL — ABNORMAL HIGH (ref 70–99)
Potassium: 3.9 mmol/L (ref 3.5–5.1)
Sodium: 144 mmol/L (ref 135–145)
Total Bilirubin: 0.3 mg/dL (ref 0.3–1.2)
Total Protein: 7.2 g/dL (ref 6.5–8.1)

## 2021-09-21 LAB — CBC WITH DIFFERENTIAL (CANCER CENTER ONLY)
Abs Immature Granulocytes: 0.01 10*3/uL (ref 0.00–0.07)
Basophils Absolute: 0.1 10*3/uL (ref 0.0–0.1)
Basophils Relative: 1 %
Eosinophils Absolute: 0.3 10*3/uL (ref 0.0–0.5)
Eosinophils Relative: 6 %
HCT: 38.9 % (ref 36.0–46.0)
Hemoglobin: 12.6 g/dL (ref 12.0–15.0)
Immature Granulocytes: 0 %
Lymphocytes Relative: 12 %
Lymphs Abs: 0.6 10*3/uL — ABNORMAL LOW (ref 0.7–4.0)
MCH: 29.6 pg (ref 26.0–34.0)
MCHC: 32.4 g/dL (ref 30.0–36.0)
MCV: 91.5 fL (ref 80.0–100.0)
Monocytes Absolute: 0.3 10*3/uL (ref 0.1–1.0)
Monocytes Relative: 6 %
Neutro Abs: 3.8 10*3/uL (ref 1.7–7.7)
Neutrophils Relative %: 75 %
Platelet Count: 255 10*3/uL (ref 150–400)
RBC: 4.25 MIL/uL (ref 3.87–5.11)
RDW: 14.1 % (ref 11.5–15.5)
WBC Count: 5.1 10*3/uL (ref 4.0–10.5)
nRBC: 0 % (ref 0.0–0.2)

## 2021-09-21 LAB — FERRITIN: Ferritin: 125 ng/mL (ref 11–307)

## 2021-09-21 MED ORDER — PANTOPRAZOLE SODIUM 40 MG PO TBEC: 40.0000 mg | DELAYED_RELEASE_TABLET | Freq: Every day | ORAL | 0 refills | Status: AC

## 2021-09-21 MED ORDER — PANTOPRAZOLE SODIUM 40 MG PO TBEC: 40.0000 mg | DELAYED_RELEASE_TABLET | Freq: Every day | ORAL | 0 refills | Status: DC

## 2021-09-21 NOTE — Progress Notes (Signed)
?[name]   ?Telephone:([phone]) [phone] [phone]   ?Clinic Follow up Note  ? ?Patient Care Team: ?[facility] as PCP - General (Geriatric Medicine) ? ?Date of Service:  09/21/2021 ? ?CHIEF COMPLAINT: f/u of esophageal cancer ? ?CURRENT THERAPY:  ?Surveillance ? ?ASSESSMENT & PLAN:  ?Sarah Gutierrez is a 76 y.o. female with  ? ?1. 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 ?-she developed dysphagia in 07/2020. EGD on 12/14/20 by Dr. Michail Sermon showed nodular, ulcerated mucosa in esophagus, biopsy showed no evidence of cancer. ?-repeat EGD on 06/01/21 by Dr. Eston Mould at [facility] again showed nodular, ulcerated mucosa in proximal to mid esophagus, and biopsy pathology revealed invasive squamous cell, moderately differentiated. ?-staging CT CAP on 08/03/21 at [facility] showed, aside from esophageal thickening: nonspecific subcarinal node, bilateral thickening of adrenal glands, and prominent subcentimeter mesenteric lymph nodes; no definitive evidence of metastatic disease. ?-she received palliative radiation 09/05/21 - 09/21/21 under Dr. Lisbeth Renshaw.  ?-she is having some stomach pain since starting radiation. She is also mildly tender to palpation on exam. I will send a written prescription for protonix with her caretaker today. ?-labs reviewed, overall no concern.  ?-She is not a candidate for chemotherapy or surgery.  Plan to watch her clinically.  We discussed signs and symptoms of cancer progression and what to watch.  Do not plan to repeat endoscopy or images unless she became more symptomatic.we also discussed palliative care and hospice if she shows signs of cancer progression.  Her brother agrees with the plan.  I will leave follow up open for her to come back as needed. ?  ?2. Korsakoff syndrome, history of alcohol abuse and heavy smoking ?-She has significant cognitive dysfunction and ataxia, spent most time in a wheelchair and bed  ?-she  is in SNF now ?  ? ?PLAN:  ?-I gave written prescription for protonix to her SNF caretaker today ?-f/u open, her brother will call us as needed.  ? ? ?No problem-specific Assessment & Plan notes found for this encounter. ? ? ?SUMMARY OF ONCOLOGIC HISTORY: ?Oncology History  ?Squamous cell esophageal cancer ([place])  ?08/17/2020 Imaging  ? CLINICAL DATA:  76 year old female with history of dysphagia. ?  ?EXAM: ?ESOPHOGRAM / BARIUM SWALLOW / BARIUM TABLET STUDY ? ?IMPRESSION: ?1. Mild narrowing of the proximal third of the esophagus which does not technically qualify as a stricture based on passage of the 13 mm barium tablet, however, this could be a source for symptoms if the patient does not adequately masticate prior to ingestion of solid foods. ?2. Normal esophageal motility. ?  ?12/14/2020 Procedure  ? Upper GI Endoscopy, Dr. Michail Sermon ? ?Impression: ?- Congested, nodular, ulcerated mucosa in the esophagus. Biopsied. ?- Gastritis. ?- Normal examined duodenum. ?  ?12/14/2020 Initial Biopsy  ? FINAL MICROSCOPIC DIAGNOSIS:  ? ?A. ESOPHAGUS, MID, BIOPSY:  ?- High-grade dysplasia in squamous mucosal fragments.  ?- See comment.  ? ?COMMENT:  ?The biopsies are superficial and I cannot exclude an invasive process.  ?  ?06/01/2021 Procedure  ? Upper GI Endoscopy, Dr. Eston Mould [facility]) ? ?Impression: ?- Nodular ulcerated mucosa in the esophagus. Biopsied.  ?- Normal stomach.  ?- Normal examined duodenum.  ?  ?06/01/2021 Pathology Results  ? Diagnosis    ?A: Esophagus, nodularity at 30 cm, biopsy ?- Squamous mucosa with high-grade dysplasia ?  ?B: Esophagus, 18-28 cm, biopsy ?- Invasive squamous cell carcinoma, moderately differentiated and background high-grade dysplasia  ? ?  ?  08/03/2021 Imaging  ? EXAM: CT CHEST W CONTRAST  ?Impression ?1. Consistent with the reported history of esophageal cancer, this study demonstrates circumferential thickening of the proximal esophagus from the thoracic inlet to the level of the carina. At the level of  the aortic arch, there is an approximately 20 x 9 x 6 mm enhancing focus of soft tissue which extends off the anterior aspect of the esophagus, just to the right of the trachea (images 35-45, series 2, image 125 series 6). Note is also made of a nonspecific borderline prominent lymph node within the subcarinal station (image 58 series 2). Otherwise, there are no findings of neoplastic disease within the thorax.  ?2. No significant imaging evidence of aspiration or infectious pneumonia  ?3. Calcified atherosclerotic disease is evident within the coronary arterial distribution.  ? ?EXAM: CT ABDOMEN PELVIS W CONTRAST  ?Impression ?-- A tiny nodular hyperattenuating focus is also identified in the distal esophagus wall anteriorly. Correlation with endoscopy findings is recommended.  ?--Bilateral diffuse thickening of the adrenal glands with mild nodularity which is not specific and indeterminate.. Further assessment with PET/CT could be considered.  ?--Multiple prominent subcentimeter mesenteric lymph nodes which are slightly more prominent compared to prior. This finding is indeterminate and these lymph nodes may also represent reactive lymph nodes due to relative long-term stability. However correlation with PET/CT findings is recommended.  ?--Otherwise, no obvious evidence of metastatic disease in the abdomen and pelvis.  ?--Small uterus with enhancing endometrium with mild prominence measuring up to 0.6 cm. This finding is indeterminate. Attention on follow-up. ?  ?08/11/2021 Initial Diagnosis  ? Squamous cell esophageal cancer ([place]) ?  ?08/22/2021 Cancer Staging  ? Staging form: Esophagus - Squamous Cell Carcinoma, AJCC 8th Edition ?- Clinical stage from 08/22/2021: Stage Unknown (cTX, cN0, cM0, G2) - Signed by Truitt Merle, MD on 08/22/2021 ?Total positive nodes: 0 ?Histologic grading system: 3 grade system ? ?  ? ? ? ?INTERVAL HISTORY:  ?Sarah Gutierrez is here for a follow up of esophageal cancer. She was last seen by  me on 08/22/21 in consultation. She presents to the clinic accompanied by her niece and a caretaker from her SNF. Pt's brother is on speaker phone due to a cold. ?She is tired today. She reports some esophageal/stomach pain. Her caretaker reports they moved her to a puree diet, which the patient does not like very much. ?  ?All other systems were reviewed with the patient and are negative. ? ?MEDICAL HISTORY:  ?Past Medical History:  ?Diagnosis Date  ? Cognitive impairment   ? Encephalopathy   ? Esophagus cancer ([place]) 06/02/2021  ? Hypertension   ? ? ?SURGICAL HISTORY: ?Past Surgical History:  ?Procedure Laterality Date  ? ABDOMINAL HYSTERECTOMY    ? BIOPSY  12/14/2020  ? Procedure: BIOPSY;  Surgeon: Wilford Corner, MD;  Location: [facility];  Service: Endoscopy;;  ? ESOPHAGOGASTRODUODENOSCOPY (EGD) WITH PROPOFOL N/A 12/14/2020  ? Procedure: ESOPHAGOGASTRODUODENOSCOPY (EGD) WITH PROPOFOL;  Surgeon: Wilford Corner, MD;  Location: [facility];  Service: Endoscopy;  Laterality: N/A;  ? ? ?I have reviewed the social history and family history with the patient and they are unchanged from previous note. ? ?ALLERGIES:  is allergic to oyster extract and oysters [shellfish allergy]. ? ?MEDICATIONS:  ?Current Outpatient Medications  ?Medication Sig Dispense Refill  ? acetaminophen (TYLENOL) 325 MG tablet Take 650 mg by mouth every 6 (six) hours as needed for mild pain or moderate pain.    ? bisacodyl (DULCOLAX) 10 MG suppository  Place 10 mg rectally as needed for moderate constipation.    ? donepezil (ARICEPT) 10 MG tablet Take 1 tablet (10 mg total) by mouth at bedtime. 90 tablet 1  ? lisinopril (ZESTRIL) 2.5 MG tablet Take 2.5 mg by mouth daily.    ? loperamide (IMODIUM) 2 MG capsule Take 2 mg by mouth 3 (three) times daily as needed for diarrhea or loose stools.    ? mineral oil liquid Take 15 mLs by mouth See admin instructions. Take 15 ml by mouth at bedtime twice weekly as needed    ? Multiple Vitamins-Minerals  (MULTIVITAMIN PO) Take 1 tablet by mouth daily.    ? NON FORMULARY Take 2.5 mLs by mouth daily. Tooth and gum tonic use 1/2 teaspoon as directed    ? ondansetron (ZOFRAN) 4 MG tablet Take 4 mg by mouth e

## 2021-09-22 ENCOUNTER — Telehealth: Payer: Self-pay

## 2021-09-22 NOTE — Telephone Encounter (Signed)
Faxed Dr. Ernestina Penna last office note to [facility] - Provider Belinda Fisher, NP attention [phone]  [phone]).  Fax confirmation received. ?

## 2021-10-05 NOTE — Progress Notes (Signed)
                                                                                                                                                             Patient Name: Sarah Gutierrez MRN: [id number] DOB: 02/17/1946 Referring Physician: Meda Klinefelter Date of Service: 09/21/2021 [facility], [place]                                                        End Of Treatment Note  Diagnoses: C15.4-Malignant neoplasm of middle third of esophagus  Cancer Staging:  cT4bN0-1M0, Squamous Cell Carcinoma of the mid esophagus  Intent: Curative  Radiation Treatment Dates: 09/05/2021 through 09/21/2021 Site Technique Total Dose (Gy) Dose per Fx (Gy) Completed Fx Beam Energies  Esophagus: Esoph IMRT 39/39 3 13/13 6X   Narrative: The patient tolerated radiation therapy relatively well. She developed fatigue, and she was eating pureed foods due to aspirating at the facility when eating regular diet.   Plan: The patient will receive a call in about one month from the radiation oncology department. She will continue follow up with Dr. Burr Medico as well.   ________________________________________________    Carola Rhine, [facility]

## 2021-10-27 NOTE — Progress Notes (Signed)
  Radiation Oncology         [phone]) [phone] ________________________________  Name: Sarah Gutierrez MRN: [id number]  Date of Service: 10/31/2021  DOB: [birth year]  Post Treatment Telephone Note  Diagnosis:   cT4bN0-1M0, Squamous Cell Carcinoma of the mid esophagus  Intent: Curative  Radiation Treatment Dates: 09/05/2021 through 09/21/2021 Site Technique Total Dose (Gy) Dose per Fx (Gy) Completed Fx Beam Energies  Esophagus: Esoph IMRT 39/39 3 13/13 6X   Narrative: The patient tolerated radiation therapy relatively well. She developed fatigue, and she was eating pureed foods due to aspirating at the facility when eating regular diet.   Impression/Plan: 1. cT4bN0-1M0, Squamous Cell Carcinoma of the mid esophagus. I was unable to reach the patient's brother and HCPOA Dr. Kateri Plummer, but left a voicemail and on the message, I discussed that we would be happy to continue to follow her as needed, but she will also follow up as needed with Dr. Mosetta Putt in medical oncology.        Osker Mason, PAC

## 2021-10-31 ENCOUNTER — Ambulatory Visit
Admission: RE | Admit: 2021-10-31 | Discharge: 2021-10-31 | Disposition: A | Discharge status: Home / Self Care | Payer: Medicare Other | Source: Ambulatory Visit | Attending: Hematology and Oncology | Admitting: Hematology and Oncology

## 2021-10-31 DIAGNOSIS — C159 Malignant neoplasm of esophagus, unspecified: Secondary | ICD-10-CM

## 2022-01-21 ENCOUNTER — Emergency Department (HOSPITAL_COMMUNITY): Payer: Medicare Other

## 2022-01-21 ENCOUNTER — Other Ambulatory Visit: Payer: Self-pay

## 2022-01-21 ENCOUNTER — Encounter (HOSPITAL_COMMUNITY): Payer: Self-pay

## 2022-01-21 ENCOUNTER — Inpatient Hospital Stay (HOSPITAL_COMMUNITY)
Admission: EM | Admit: 2022-01-21 | Discharge: 2022-01-23 | DRG: 177 | Disposition: A | Discharge status: Skilled Nursing Facility | Payer: Medicare Other | Source: Skilled Nursing Facility | Attending: Internal Medicine | Admitting: Internal Medicine

## 2022-01-21 DIAGNOSIS — Z79899 Other long term (current) drug therapy: Secondary | ICD-10-CM

## 2022-01-21 DIAGNOSIS — R0989 Other specified symptoms and signs involving the circulatory and respiratory systems: Principal | ICD-10-CM

## 2022-01-21 DIAGNOSIS — I10 Essential (primary) hypertension: Secondary | ICD-10-CM | POA: Diagnosis present

## 2022-01-21 DIAGNOSIS — Z515 Encounter for palliative care: Secondary | ICD-10-CM

## 2022-01-21 DIAGNOSIS — F04 Amnestic disorder due to known physiological condition: Secondary | ICD-10-CM | POA: Diagnosis present

## 2022-01-21 DIAGNOSIS — Z90710 Acquired absence of both cervix and uterus: Secondary | ICD-10-CM

## 2022-01-21 DIAGNOSIS — Z8249 Family history of ischemic heart disease and other diseases of the circulatory system: Secondary | ICD-10-CM

## 2022-01-21 DIAGNOSIS — K5900 Constipation, unspecified: Secondary | ICD-10-CM | POA: Diagnosis present

## 2022-01-21 DIAGNOSIS — Z808 Family history of malignant neoplasm of other organs or systems: Secondary | ICD-10-CM

## 2022-01-21 DIAGNOSIS — J690 Pneumonitis due to inhalation of food and vomit: Principal | ICD-10-CM

## 2022-01-21 DIAGNOSIS — Z923 Personal history of irradiation: Secondary | ICD-10-CM

## 2022-01-21 DIAGNOSIS — Z87891 Personal history of nicotine dependence: Secondary | ICD-10-CM

## 2022-01-21 DIAGNOSIS — C159 Malignant neoplasm of esophagus, unspecified: Secondary | ICD-10-CM | POA: Diagnosis present

## 2022-01-21 DIAGNOSIS — F0390 Unspecified dementia without behavioral disturbance: Secondary | ICD-10-CM | POA: Diagnosis present

## 2022-01-21 DIAGNOSIS — E43 Unspecified severe protein-calorie malnutrition: Secondary | ICD-10-CM | POA: Diagnosis present

## 2022-01-21 DIAGNOSIS — J9601 Acute respiratory failure with hypoxia: Secondary | ICD-10-CM | POA: Diagnosis present

## 2022-01-21 DIAGNOSIS — R0902 Hypoxemia: Secondary | ICD-10-CM | POA: Diagnosis not present

## 2022-01-21 DIAGNOSIS — Z91013 Allergy to seafood: Secondary | ICD-10-CM

## 2022-01-21 DIAGNOSIS — Z681 Body mass index (BMI) 19 or less, adult: Secondary | ICD-10-CM

## 2022-01-21 LAB — LACTIC ACID, PLASMA
Lactic Acid, Venous: 2 mmol/L (ref 0.5–1.9)
Lactic Acid, Venous: 1.9 mmol/L (ref 0.5–1.9)

## 2022-01-21 LAB — COMPREHENSIVE METABOLIC PANEL
ALT: 11 U/L (ref 0–44)
AST: 18 U/L (ref 15–41)
Albumin: 3.6 g/dL (ref 3.5–5.0)
Alkaline Phosphatase: 79 U/L (ref 38–126)
Anion gap: 10 (ref 5–15)
BUN: 19 mg/dL (ref 8–23)
CO2: 25 mmol/L (ref 22–32)
Calcium: 10.3 mg/dL (ref 8.9–10.3)
Chloride: 105 mmol/L (ref 98–111)
Creatinine, Ser: 0.92 mg/dL (ref 0.44–1.00)
GFR, Estimated: >60 mL/min (ref >60)
Glucose, Bld: 121 mg/dL — ABNORMAL HIGH (ref 70–99)
Potassium: 4.1 mmol/L (ref 3.5–5.1)
Sodium: 140 mmol/L (ref 135–145)
Total Bilirubin: 0.2 mg/dL — ABNORMAL LOW (ref 0.3–1.2)
Total Protein: 7.2 g/dL (ref 6.5–8.1)

## 2022-01-21 LAB — CBC WITH DIFFERENTIAL/PLATELET
Abs Immature Granulocytes: 0.03 10*3/uL (ref 0.00–0.07)
Basophils Absolute: 0.1 10*3/uL (ref 0.0–0.1)
Basophils Relative: 1 %
Eosinophils Absolute: 0.1 10*3/uL (ref 0.0–0.5)
Eosinophils Relative: 1 %
HCT: 41 % (ref 36.0–46.0)
Hemoglobin: 13.3 g/dL (ref 12.0–15.0)
Immature Granulocytes: 0 %
Lymphocytes Relative: 10 %
Lymphs Abs: 1 10*3/uL (ref 0.7–4.0)
MCH: 30.8 pg (ref 26.0–34.0)
MCHC: 32.4 g/dL (ref 30.0–36.0)
MCV: 94.9 fL (ref 80.0–100.0)
Monocytes Absolute: 0.3 10*3/uL (ref 0.1–1.0)
Monocytes Relative: 3 %
Neutro Abs: 9 10*3/uL — ABNORMAL HIGH (ref 1.7–7.7)
Neutrophils Relative %: 85 %
Platelets: 386 10*3/uL (ref 150–400)
RBC: 4.32 MIL/uL (ref 3.87–5.11)
RDW: 13.8 % (ref 11.5–15.5)
WBC: 10.6 10*3/uL — ABNORMAL HIGH (ref 4.0–10.5)
nRBC: 0 % (ref 0.0–0.2)

## 2022-01-21 LAB — LIPASE, BLOOD: Lipase: 31 U/L (ref 11–51)

## 2022-01-21 MED ORDER — ENOXAPARIN SODIUM 30 MG/0.3ML IJ SOSY
30.0000 mg | PREFILLED_SYRINGE | INTRAMUSCULAR | Status: DC
  Administered 2022-01-21: 30 mg via SUBCUTANEOUS
  Filled 2022-01-21: qty 0.3

## 2022-01-21 MED ORDER — BISACODYL 10 MG RE SUPP: 10.0000 mg | RECTAL | Status: DC | PRN

## 2022-01-21 MED ORDER — QUETIAPINE FUMARATE 25 MG PO TABS
25.0000 mg | ORAL_TABLET | Freq: Every day | ORAL | Status: DC
  Administered 2022-01-22: 25 mg via ORAL
  Filled 2022-01-21: qty 1

## 2022-01-21 MED ORDER — MINERAL OIL PO OIL: 15.0000 mL | TOPICAL_OIL | ORAL | Status: DC

## 2022-01-21 MED ORDER — ONDANSETRON HCL 4 MG PO TABS: 4.0000 mg | ORAL_TABLET | Freq: Four times a day (QID) | ORAL | Status: DC | PRN

## 2022-01-21 MED ORDER — DONEPEZIL HCL 10 MG PO TABS
10.0000 mg | ORAL_TABLET | Freq: Every day | ORAL | Status: DC
Start: 2022-01-21 — End: 2022-01-22

## 2022-01-21 MED ORDER — LACTATED RINGERS IV BOLUS
500.0000 mL | Freq: Once | INTRAVENOUS | Status: AC
  Administered 2022-01-21: 500 mL via INTRAVENOUS

## 2022-01-21 MED ORDER — SODIUM CHLORIDE 0.9 % IV SOLN
1.5000 g | Freq: Three times a day (TID) | INTRAVENOUS | Status: DC
  Administered 2022-01-21 – 2022-01-23 (×7): 1.5 g via INTRAVENOUS
  Filled 2022-01-21 (×9): qty 4

## 2022-01-21 MED ORDER — LOPERAMIDE HCL 2 MG PO CAPS: 2.0000 mg | ORAL_CAPSULE | Freq: Three times a day (TID) | ORAL | Status: DC | PRN

## 2022-01-21 MED ORDER — IOHEXOL 350 MG/ML SOLN
80.0000 mL | Freq: Once | INTRAVENOUS | Status: AC | PRN
  Administered 2022-01-21: 80 mL via INTRAVENOUS

## 2022-01-21 MED ORDER — ONDANSETRON HCL 4 MG/2ML IJ SOLN
4.0000 mg | Freq: Once | INTRAMUSCULAR | Status: AC
Start: 2022-01-21 — End: 2022-01-21
  Administered 2022-01-21: 4 mg via INTRAVENOUS
  Filled 2022-01-21: qty 2

## 2022-01-21 MED ORDER — TRAZODONE HCL 50 MG PO TABS
50.0000 mg | ORAL_TABLET | Freq: Three times a day (TID) | ORAL | Status: DC | PRN
  Administered 2022-01-23: 50 mg via ORAL
  Filled 2022-01-21: qty 1

## 2022-01-21 MED ORDER — HALOPERIDOL LACTATE 5 MG/ML IJ SOLN
2.0000 mg | Freq: Once | INTRAMUSCULAR | Status: AC
  Administered 2022-01-21: 2 mg via INTRAVENOUS
  Filled 2022-01-21: qty 1

## 2022-01-21 MED ORDER — GLUCAGON HCL RDNA (DIAGNOSTIC) 1 MG IJ SOLR
1.0000 mg | Freq: Once | INTRAMUSCULAR | Status: AC
  Administered 2022-01-21: 1 mg via INTRAVENOUS
  Filled 2022-01-21: qty 1

## 2022-01-21 MED ORDER — LISINOPRIL 5 MG PO TABS: 2.5000 mg | ORAL_TABLET | Freq: Every day | ORAL | Status: DC

## 2022-01-21 MED ORDER — PRAVASTATIN SODIUM 10 MG PO TABS
10.0000 mg | ORAL_TABLET | Freq: Every evening | ORAL | Status: DC
  Administered 2022-01-22: 10 mg via ORAL
  Filled 2022-01-21: qty 1

## 2022-01-21 MED ORDER — SERTRALINE HCL 50 MG PO TABS
50.0000 mg | ORAL_TABLET | Freq: Every day | ORAL | Status: DC
  Administered 2022-01-22 – 2022-01-23 (×2): 50 mg via ORAL
  Filled 2022-01-21 (×2): qty 1

## 2022-01-21 MED ORDER — OLANZAPINE 10 MG IM SOLR
2.5000 mg | Freq: Four times a day (QID) | INTRAMUSCULAR | Status: DC | PRN
  Filled 2022-01-21: qty 10

## 2022-01-21 MED ORDER — SENNOSIDES-DOCUSATE SODIUM 8.6-50 MG PO TABS: 2.0000 | ORAL_TABLET | Freq: Every day | ORAL | Status: DC

## 2022-01-21 MED ORDER — POLYETHYLENE GLYCOL 3350 17 G PO PACK
17.0000 g | PACK | Freq: Every day | ORAL | Status: DC
  Administered 2022-01-22 – 2022-01-23 (×2): 17 g via ORAL
  Filled 2022-01-21 (×2): qty 1

## 2022-01-21 MED ORDER — PANTOPRAZOLE SODIUM 40 MG PO TBEC
40.0000 mg | DELAYED_RELEASE_TABLET | Freq: Every day | ORAL | Status: DC
  Administered 2022-01-22 – 2022-01-23 (×2): 40 mg via ORAL
  Filled 2022-01-21 (×2): qty 1

## 2022-01-21 MED ORDER — ACETAMINOPHEN 325 MG PO TABS
650.0000 mg | ORAL_TABLET | Freq: Four times a day (QID) | ORAL | Status: DC | PRN
  Administered 2022-01-23: 650 mg via ORAL
  Filled 2022-01-21: qty 2

## 2022-01-21 MED ORDER — LORAZEPAM 2 MG/ML IJ SOLN
1.0000 mg | Freq: Once | INTRAMUSCULAR | Status: AC
  Administered 2022-01-21: 1 mg via INTRAVENOUS
  Filled 2022-01-21: qty 1

## 2022-01-21 MED ORDER — IPRATROPIUM-ALBUTEROL 0.5-2.5 (3) MG/3ML IN SOLN: 3.0000 mL | Freq: Four times a day (QID) | RESPIRATORY_TRACT | Status: DC | PRN

## 2022-01-21 NOTE — ED Notes (Signed)
Oxygen cut off and trialing off oxygen.

## 2022-01-21 NOTE — ED Provider Notes (Signed)
[facility] EMERGENCY DEPARTMENT Provider Note   CSN: [id number] Arrival date & time: 01/21/22  0855     History  Chief Complaint  Patient presents with   Choking    Sarah Gutierrez is a 76 y.o. female.  Level 5 caveat for cognitive impairment and difficulty breathing.  Patient arrives from her facility with choking episode, drooling, dry heaving.  She has a history of squamous cell esophageal cancer status postradiation is supposed to be on a pured diet.  For the past 2 days the patient has been getting regular food per family request.  Patient unable to give any history on arrival but indicates a foreign body sensation to her throat and is drooling and dry heaving.  Shortly after arrival patient was able to cough up a large piece of sausage.  Her drooling and distress have improved.  However she is hypoxic to 82% on room air.  Lungs are clear but diminished bilaterally.  No wheezing.  The history is provided by the patient. The history is limited by the absence of a caregiver and the condition of the patient.       Home Medications Prior to Admission medications   Medication Sig Start Date End Date Taking? Authorizing Provider  acetaminophen (TYLENOL) 325 MG tablet Take 650 mg by mouth every 6 (six) hours as needed for mild pain or moderate pain.    [provider]  bisacodyl (DULCOLAX) 10 MG suppository Place 10 mg rectally as needed for moderate constipation.    [provider]  donepezil (ARICEPT) 10 MG tablet Take 1 tablet (10 mg total) by mouth at bedtime. 05/27/21   Rondel Jumbo, PA-C  lisinopril (ZESTRIL) 2.5 MG tablet Take 2.5 mg by mouth daily. 06/05/21   [provider]  loperamide (IMODIUM) 2 MG capsule Take 2 mg by mouth 3 (three) times daily as needed for diarrhea or loose stools.    [provider]  mineral oil liquid Take 15 mLs by mouth See admin instructions. Take 15 ml by mouth at bedtime twice weekly as  needed    [provider]  Multiple Vitamins-Minerals (MULTIVITAMIN PO) Take 1 tablet by mouth daily.    [provider]  NON FORMULARY Take 2.5 mLs by mouth daily. Tooth and gum tonic use 1/2 teaspoon as directed    [provider]  ondansetron (ZOFRAN) 4 MG tablet Take 4 mg by mouth every 6 (six) hours as needed for nausea or vomiting.    [provider]  pantoprazole (PROTONIX) 40 MG tablet Take 1 tablet (40 mg total) by mouth daily. 09/21/21   Truitt Merle, MD  polyethylene glycol [facility] / Floria Raveling) packet Take 17 g by mouth daily. 06/01/18   Dessa Phi, DO  pravastatin (PRAVACHOL) 10 MG tablet Take 10 mg by mouth every evening.    [provider]  QUEtiapine (SEROQUEL) 25 MG tablet Take by mouth.    [provider]  senna-docusate (SENOKOT-S) 8.6-50 MG tablet Take 1 tablet by mouth daily. Patient taking differently: Take 2 tablets by mouth at bedtime. 06/01/18   Dessa Phi, DO  sertraline (ZOLOFT) 50 MG tablet Take 50 mg by mouth daily.    [provider]  thiamine 100 MG tablet Take 100 mg by mouth daily.    [provider]  traZODone (DESYREL) 50 MG tablet Take 1 tablet (50 mg total) by mouth 3 (three) times daily as needed (agitation). 08/04/19   Tegeler, Gwenyth Allegra, MD  triamcinolone  cream (KENALOG) 0.1 % Apply 1 application topically 3 (three) times daily.    [provider]      Allergies    Oyster extract and [employer] allergy]    Review of Systems   Review of Systems  Unable to perform ROS: Patient nonverbal    Physical Exam Updated Vital Signs BP (!) 146/56 (BP Location: Right Arm)   Pulse (!) 52   Temp 98.6 F (37 C) (Oral)   Resp 14   Ht '5\' 5"'$  (1.651 m)   Wt 50.8 kg   SpO2 92%   BMI 18.64 kg/m  Physical Exam Vitals and nursing note reviewed.  Constitutional:      General: She is in acute distress.     Appearance: She is well-developed.     Comments: Drooling and dry  heaving on arrival.  HENT:     Head: Normocephalic and atraumatic.     Mouth/Throat:     Pharynx: No oropharyngeal exudate.     Comments: Oropharynx clear without foreign body.  Uvula is midline.  Not initially controlling secretions but after coughing up she is able to. Eyes:     Conjunctiva/sclera: Conjunctivae normal.     Pupils: Pupils are equal, round, and reactive to light.  Neck:     Comments: No meningismus. Cardiovascular:     Rate and Rhythm: Normal rate and regular rhythm.     Heart sounds: Normal heart sounds. No murmur heard. Pulmonary:     Effort: Pulmonary effort is normal. No respiratory distress.     Breath sounds: Normal breath sounds.  Chest:     Chest wall: No tenderness.  Abdominal:     Palpations: Abdomen is soft.     Tenderness: There is no abdominal tenderness. There is no guarding or rebound.  Musculoskeletal:        General: No tenderness. Normal range of motion.     Cervical back: Normal range of motion and neck supple.  Skin:    General: Skin is warm.  Neurological:     Mental Status: She is alert.     Cranial Nerves: No cranial nerve deficit.     Motor: No abnormal muscle tone.     Coordination: Coordination normal.     Comments: Oriented to person only. Moves all extremities and follows commands  Psychiatric:        Behavior: Behavior normal.     ED Results / Procedures / Treatments   Labs (all labs ordered are listed, but only abnormal results are displayed) Labs Reviewed  CBC WITH DIFFERENTIAL/PLATELET - Abnormal; Notable for the following components:      Result Value   WBC 10.6 (*)    Neutro Abs 9.0 (*)    All other components within normal limits  COMPREHENSIVE METABOLIC PANEL - Abnormal; Notable for the following components:   Glucose, Bld 121 (*)    Total Bilirubin 0.2 (*)    All other components within normal limits  LACTIC ACID, PLASMA - Abnormal; Notable for the following components:   Lactic Acid, Venous 2.0 (*)    All  other components within normal limits  CULTURE, BLOOD (ROUTINE X 2)  CULTURE, BLOOD (ROUTINE X 2)  LIPASE, BLOOD  LACTIC ACID, PLASMA    EKG EKG Interpretation  Date/Time:  Saturday January 21 2022 09:16:13 EDT Ventricular Rate:  60 PR Interval:  151 QRS Duration: 70 QT Interval:  417 QTC Calculation: 417 R Axis:   77 Text Interpretation: Sinus rhythm No significant change  was found Confirmed by Ezequiel Essex [phone]) on 01/21/2022 9:21:23 AM  Radiology CT Angio Chest PE W and/or Wo Contrast  Result Date: 01/21/2022 CLINICAL DATA:  Abdominal pain nonlocalized also concern for aspiration pneumonia. History of esophageal cancer EXAM: CT ANGIOGRAPHY CHEST CT ABDOMEN AND PELVIS WITH CONTRAST TECHNIQUE: Multidetector CT imaging of the chest was performed using the standard protocol during bolus administration of intravenous contrast. Multiplanar CT image reconstructions and MIPs were obtained to evaluate the vascular anatomy. Multidetector CT imaging of the abdomen and pelvis was performed using the standard protocol during bolus administration of intravenous contrast. RADIATION DOSE REDUCTION: This exam was performed according to the departmental dose-optimization program which includes automated exposure control, adjustment of the mA and/or kV according to patient size and/or use of iterative reconstruction technique. CONTRAST:  18m OMNIPAQUE IOHEXOL 350 MG/ML SOLN COMPARISON:  Multiple priors including outside CTs of the chest abdomen pelvis dated August 03, 2021 FINDINGS: CTA CHEST FINDINGS Cardiovascular: Satisfactory opacification of the pulmonary arteries to the segmental level. No evidence of pulmonary embolism. Aortic atherosclerosis. No thoracic aortic aneurysm. Normal heart size. No pericardial effusion. Mediastinum/Nodes: There is some thickening of the proximal/mid esophagus for instance on image 26/6 which may reflect patient's known esophageal neoplasm. No supraclavicular adenopathy.  No discrete thyroid nodule. No pathologically enlarged mediastinal, hilar or axillary lymph nodes. Lungs/Pleura: Biapical pleuroparenchymal scarring. Scarring in the paramedian/paramediastinal scarring in the upper and lower lobes is new from prior and likely reflect sequela of radiation therapy. Patchy nodular ground-glass opacities in the dependent lungs for instance on image 61/7 and 104/7 in the right lower lobe on image 60/7 in the left lower lobe. As well there are new multifocal bilateral pulmonary nodules. Reference nodules are as follows: -left lower lobe measuring 5 mm on image 94/7 -Right lower lobe pulmonary nodule measures 6 mm on image 59/7. -Right upper lobe pulmonary nodule measures 3 mm pulmonary nodule on image 59/7. Diffuse bronchial wall thickening with mosaic attenuation of the lungs may reflect small airways disease. Musculoskeletal: No aggressive lytic or blastic lesion of bone. No acute osseous abnormality. Review of the MIP images confirms the above findings. CT ABDOMEN and PELVIS FINDINGS Imaging of the abdomen and pelvis is slightly degraded by motion. Hepatobiliary: Ill-defined hypodensity along the falciform ligament in segment IV is similar prior and commonly reflects fatty infiltration or differential perfusion (veins of [place]). No suspicious hepatic lesion. Gallbladder is unremarkable. No biliary ductal dilation. Pancreas: No pancreatic ductal dilation or evidence of acute inflammation. Spleen: No splenomegaly or focal splenic lesion. Adrenals/Urinary Tract: Similar bilateral adrenal thickening without discrete nodularity, favor hyperplasia. No hydronephrosis. Kidneys demonstrate symmetric enhancement and excretion of contrast material. Urinary bladder is unremarkable for degree of distension. Stomach/Bowel: No radiopaque enteric contrast material was administered. Large rectal stool ball with rectal wall thickening and perirectal stranding consistent with stercoral colitis. 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 Stomach is nondistended limiting evaluation. No pathologic dilation of small or large bowel. Colonic diverticulosis without findings of acute diverticulitis. Vascular/Lymphatic: Aortic atherosclerosis. No pathologically enlarged abdominal or pelvic lymph nodes. Reproductive: Similar lobular enhancement of the uterus consistent with uterine leiomyomas. No suspicious adnexal mass. Other: No significant abdominopelvic free fluid. No discrete peritoneal or omental nodularity. Musculoskeletal: No aggressive lytic or blastic lesion of bone. Mild multilevel degenerative changes spine. Review of the MIP images confirms the above findings. IMPRESSION: 1. No pulmonary embolus. 2. New bilateral multifocal pulmonary nodules and ground-glass opacities which may  be infectious or inflammatory, some of which are located in the dependent lungs and may reflect a component of aspiration. However, metastatic disease involvement is a pertinent differential consideration not excluded on this examination. Suggest short-term interval follow-up with dedicated chest CT. 3. Diffuse bronchial wall thickening with mosaic attenuation of the lungs suggestive of small airways disease. 4. New patchy ground-glass opacities in the paramedian/paramediastinal bilateral upper and lower lobes likely reflecting postradiation change. 5. Wall thickening of the proximal/mid esophagus may reflect patient's known esophageal neoplasm. 6. Large stool ball in the rectum with findings that can be seen with stercoral colitis. 7.  Aortic Atherosclerosis (ICD10-I70.0). Electronically Signed   By: Dahlia Bailiff M.D.   On: 01/21/2022 13:30   CT ABDOMEN PELVIS W CONTRAST  Result Date: 01/21/2022 CLINICAL DATA:  Abdominal pain nonlocalized also concern for aspiration pneumonia. History of esophageal cancer EXAM: CT ANGIOGRAPHY CHEST CT ABDOMEN AND PELVIS WITH CONTRAST TECHNIQUE:  Multidetector CT imaging of the chest was performed using the standard protocol during bolus administration of intravenous contrast. Multiplanar CT image reconstructions and MIPs were obtained to evaluate the vascular anatomy. Multidetector CT imaging of the abdomen and pelvis was performed using the standard protocol during bolus administration of intravenous contrast. RADIATION DOSE REDUCTION: This exam was performed according to the departmental dose-optimization program which includes automated exposure control, adjustment of the mA and/or kV according to patient size and/or use of iterative reconstruction technique. CONTRAST:  19m OMNIPAQUE IOHEXOL 350 MG/ML SOLN COMPARISON:  Multiple priors including outside CTs of the chest abdomen pelvis dated August 03, 2021 FINDINGS: CTA CHEST FINDINGS Cardiovascular: Satisfactory opacification of the pulmonary arteries to the segmental level. No evidence of pulmonary embolism. Aortic atherosclerosis. No thoracic aortic aneurysm. Normal heart size. No pericardial effusion. Mediastinum/Nodes: There is some thickening of the proximal/mid esophagus for instance on image 26/6 which may reflect patient's known esophageal neoplasm. No supraclavicular adenopathy. No discrete thyroid nodule. No pathologically enlarged mediastinal, hilar or axillary lymph nodes. Lungs/Pleura: Biapical pleuroparenchymal scarring. Scarring in the paramedian/paramediastinal scarring in the upper and lower lobes is new from prior and likely reflect sequela of radiation therapy. Patchy nodular ground-glass opacities in the dependent lungs for instance on image 61/7 and 104/7 in the right lower lobe on image 60/7 in the left lower lobe. As well there are new multifocal bilateral pulmonary nodules. Reference nodules are as follows: -left lower lobe measuring 5 mm on image 94/7 -Right lower lobe pulmonary nodule measures 6 mm on image 59/7. -Right upper lobe pulmonary nodule measures 3 mm pulmonary nodule  on image 59/7. Diffuse bronchial wall thickening with mosaic attenuation of the lungs may reflect small airways disease. Musculoskeletal: No aggressive lytic or blastic lesion of bone. No acute osseous abnormality. Review of the MIP images confirms the above findings. CT ABDOMEN and PELVIS FINDINGS Imaging of the abdomen and pelvis is slightly degraded by motion. Hepatobiliary: Ill-defined hypodensity along the falciform ligament in segment IV is similar prior and commonly reflects fatty infiltration or differential perfusion (veins of [place]). No suspicious hepatic lesion. Gallbladder is unremarkable. No biliary ductal dilation. Pancreas: No pancreatic ductal dilation or evidence of acute inflammation. Spleen: No splenomegaly or focal splenic lesion. Adrenals/Urinary Tract: Similar bilateral adrenal thickening without discrete nodularity, favor hyperplasia. No hydronephrosis. Kidneys demonstrate symmetric enhancement and excretion of contrast material. Urinary bladder is unremarkable for degree of distension. Stomach/Bowel: No radiopaque enteric contrast material was administered. Large rectal stool ball with rectal wall thickening and perirectal stranding consistent with stercoral colitis. 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 Stomach is nondistended limiting evaluation. No pathologic dilation of small or large bowel. Colonic diverticulosis without findings of acute diverticulitis. Vascular/Lymphatic: Aortic atherosclerosis. No pathologically enlarged abdominal or pelvic lymph nodes. Reproductive: Similar lobular enhancement of the uterus consistent with uterine leiomyomas. No suspicious adnexal mass. Other: No significant abdominopelvic free fluid. No discrete peritoneal or omental nodularity. Musculoskeletal: No aggressive lytic or blastic lesion of bone. Mild multilevel degenerative changes spine. Review of the MIP images confirms the above findings. IMPRESSION:  1. No pulmonary embolus. 2. New bilateral multifocal pulmonary nodules and ground-glass opacities which may be infectious or inflammatory, some of which are located in the dependent lungs and may reflect a component of aspiration. However, metastatic disease involvement is a pertinent differential consideration not excluded on this examination. Suggest short-term interval follow-up with dedicated chest CT. 3. Diffuse bronchial wall thickening with mosaic attenuation of the lungs suggestive of small airways disease. 4. New patchy ground-glass opacities in the paramedian/paramediastinal bilateral upper and lower lobes likely reflecting postradiation change. 5. Wall thickening of the proximal/mid esophagus may reflect patient's known esophageal neoplasm. 6. Large stool ball in the rectum with findings that can be seen with stercoral colitis. 7.  Aortic Atherosclerosis (ICD10-I70.0). Electronically Signed   By: Dahlia Bailiff M.D.   On: 01/21/2022 13:30   DG Neck Soft Tissue  Result Date: 01/21/2022 CLINICAL DATA:  Hypoxia Aspiration EXAM: NECK SOFT TISSUES - 1+ VIEW COMPARISON:  None available FINDINGS: The prevertebral soft tissues are normal in thickness. No radiopaque foreign body. Degenerative changes seen throughout the cervical spine, greatest at C5-C6. Evaluation of epiglottis limited due to overlying external artifact. It does not appear significantly thickened. IMPRESSION: No significant abnormality of the neck soft tissues. If there is continued clinical concern for neck soft tissue pathology, further evaluation with contrast enhanced soft tissue neck CT should be performed. Electronically Signed   By: Miachel Roux M.D.   On: 01/21/2022 10:26   DG Chest 2 View  Result Date: 01/21/2022 CLINICAL DATA:  Hypoxia, aspiration. EXAM: CHEST - 2 VIEW COMPARISON:  Chest x-ray dated 07/10/2021 FINDINGS: Heart size and mediastinal contours are within normal limits. Lungs appear hyperexpanded. Lungs are clear. No  pleural effusion or pneumothorax is seen. No acute-appearing osseous abnormality. Chronic appearing rib fractures bilaterally. IMPRESSION: 1. No active cardiopulmonary disease. No evidence of pneumonia or pulmonary edema. 2. Hyperexpanded lungs suggesting COPD. Electronically Signed   By: Franki Cabot M.D.   On: 01/21/2022 10:24    Procedures Procedures    Medications Ordered in ED Medications  ondansetron (ZOFRAN) injection 4 mg (has no administration in time range)  glucagon (human recombinant) (GLUCAGEN) injection 1 mg (has no administration in time range)  lactated ringers bolus 500 mL (has no administration in time range)    ED Course/ Medical Decision Making/ A&P                           Medical Decision Making Amount and/or Complexity of Data Reviewed Labs: ordered. Decision-making details documented in ED Course. Radiology: ordered and independent interpretation performed. Decision-making details documented in ED Course. ECG/medicine tests: ordered and independent interpretation performed. Decision-making details documented in ED Course.  Risk Prescription drug management. Decision regarding hospitalization.   Patient arrives with choking episode and suspected food impaction after eating regular diet.  Does have esophageal cancer status postradiation.  No current chemotherapy.  Distress and drooling  have improved after she coughed up a large piece of sausage.  However she is hypoxic to the 80s on room air with concern for aspiration.  She is given IV fluids, Zofran, glucagon.  Chest x-ray shows no infiltrate.  Her lungs are clear.  However she does continue to have an oxygen requirement and desaturates to 86% on room air.  Will obtain CT scan to evaluate for occult pneumonia versus pulmonary embolism. Empiric antibiotics begun for suspected aspiration.  Discussed with patient's power of attorney Dr. Harvel Quale by phone.  CT scan is obtained and shows no pulmonary  embolism.  Does show groundglass opacities throughout the lungs concerning for likely aspiration but metastasis cannot be ruled out.  With new oxygen requirement will require admission for aspiration pneumonia.  Continue IV antibiotics after cultures are obtained.  Discussed with Dr. Roosevelt Locks.         Final Clinical Impression(s) / ED Diagnoses Final diagnoses:  Choking episode  Acute respiratory failure with hypoxia [facility])    Rx / DC Orders ED Discharge Orders     None         Markiya Keefe, Annie Main, MD 01/21/22 1515

## 2022-01-21 NOTE — Evaluation (Signed)
Clinical/Bedside Swallow Evaluation Patient Details  Name: Sarah Gutierrez MRN: [id number] Date of Birth: 04-22-1946  Today's Date: 01/21/2022 Time: SLP Start Time (ACUTE ONLY): 1519 SLP Stop Time (ACUTE ONLY): 5397 SLP Time Calculation (min) (ACUTE ONLY): 20 min  Past Medical History:  Past Medical History:  Diagnosis Date   Cognitive impairment    Encephalopathy    Esophagus cancer ([place]) 06/02/2021   Hypertension    Past Surgical History:  Past Surgical History:  Procedure Laterality Date   ABDOMINAL HYSTERECTOMY     BIOPSY  12/14/2020   Procedure: BIOPSY;  Surgeon: Wilford Corner, MD;  Location: [facility];  Service: Endoscopy;;   ESOPHAGOGASTRODUODENOSCOPY (EGD) WITH PROPOFOL N/A 12/14/2020   Procedure: ESOPHAGOGASTRODUODENOSCOPY (EGD) WITH PROPOFOL;  Surgeon: Wilford Corner, MD;  Location: [facility];  Service: Endoscopy;  Laterality: N/A;   HPI:  76 y.o. 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 Dx'd with esophageal cancer earlier this year, underwent radiation treatment in March/April 2023.    Assessment / Plan / Recommendation  Clinical Impression  Ms. Sadiq was pleasant, smiling, able to follow some simple commands and spoke minimally.  Oral mechanism exam was normal. She fed herself a container of pudding and drank ice water from a straw with overall functional swallowing. There was one mild incident of coughing near the end of the assessment.  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 Effects of fibrosis lead to poor esophageal motility, retention/backflow of POs, and potential aspiration of this  backflow. These deficits will be more challenging in the context of her Korsakoff syndrome.  Recommend continuing dysphagia 1 diet with thin liquids; crush meds. If she and her family have not had a consult with Palliative care in the past, it may be beneficial either here or as an OP to address  progressive dysphagia and its possible adverse consequences. No further acute care SLP f/u is needed - our service will sign off. SLP Visit Diagnosis: Dysphagia, pharyngoesophageal phase (R13.14)    Aspiration Risk    unknown   Diet Recommendation   Dysphagia 1, thin liquids  Medication Administration: Crushed with puree    Other  Recommendations Oral Care Recommendations: Oral care BID    Recommendations for follow up therapy are one component of a multi-disciplinary discharge planning process, led by the attending physician.  Recommendations may be updated based on patient status, additional functional criteria and insurance authorization.  Follow up Recommendations No SLP follow up                          Prognosis Prognosis for Safe Diet Advancement: Guarded      Swallow Study   General Date of Onset: 01/21/22 HPI: 76 y.o. 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 (memory care) after choking on sausageat breakfast (In ED, pieces of sausage were extracted from throat). Dx'd with esophageal cancer earlier this year, underwent radiation treatment in March/April 2023. Type of Study: Bedside Swallow Evaluation Previous Swallow Assessment: no Diet Prior to this Study: Dysphagia 1 (puree);Thin liquids Temperature Spikes Noted: No Respiratory Status:  Room air History of Recent Intubation: No Behavior/Cognition: Alert;Confused;Requires cueing Oral Cavity Assessment: Within Functional Limits Oral Care Completed by  SLP: No Vision: Functional for self-feeding Self-Feeding Abilities: Able to feed self Patient Positioning: Upright in bed Baseline Vocal Quality: Normal Volitional Cough: Cognitively unable to elicit Volitional Swallow: Unable to elicit    Oral/Motor/Sensory Function Overall Oral Motor/Sensory Function: Within functional limits   Ice Chips Ice chips: Within functional limits   Thin Liquid Thin Liquid: Within functional limits    Nectar Thick Nectar Thick Liquid: Not tested   Honey Thick Honey Thick Liquid: Not tested   Puree Puree: Within functional limits   Solid    Morgyn Marut L. Tivis Ringer, MA CCC/SLP Clinical Specialist - Acute Care SLP Acute Rehabilitation Services Office number [phone]  Solid: Not tested      Juan Quam Laurice 01/21/2022,3:50 PM

## 2022-01-21 NOTE — ED Notes (Signed)
ED TO INPATIENT HANDOFF REPORT  ED Nurse Name and Phone #: [phone]  S Name/Age/Gender Sarah Gutierrez 76 y.o. female Room/Bed: 020C/020C  Code Status   Code Status: Partial Code  Home/SNF/Other Nursing Home Patient oriented to: self Is this baseline? Yes   Triage Complete: Triage complete  Chief Complaint Hypoxia [R09.02]  Triage Note Hx of throat cancer suppose to be on puree food.  Patient coming from [facility]  Patient was given diced sausage and now coughing and drooling.  Family had requested her be on regular food.  Patient feels there is something stuck in throat but she can't get it out.    Allergies Allergies  Allergen Reactions   Oyster Extract    Oysters [Shellfish Allergy] Nausea And Vomiting    Level of Care/Admitting Diagnosis ED Disposition     ED Disposition  Admit   Condition  --   Comment  Hospital Area: [facility] [[id number]]  Level of Care: Telemetry Medical [104]  May place patient in observation at [facility] or [facility] if equivalent level of care is available:: No  Covid Evaluation: Asymptomatic - no recent exposure (last 10 days) testing not required  Diagnosis: Hypoxia [300808]  Admitting Physician: Lequita Halt [[id number]]  Attending Physician: Lequita Halt [[id number]]          B Medical/Surgery History Past Medical History:  Diagnosis Date   Cognitive impairment    Encephalopathy    Esophagus cancer ([place]) 06/02/2021   Hypertension    Past Surgical History:  Procedure Laterality Date   ABDOMINAL HYSTERECTOMY     BIOPSY  12/14/2020   Procedure: BIOPSY;  Surgeon: Wilford Corner, MD;  [facility];  Service: Endoscopy;;   ESOPHAGOGASTRODUODENOSCOPY (EGD) WITH PROPOFOL N/A 12/14/2020   Procedure: ESOPHAGOGASTRODUODENOSCOPY (EGD) WITH PROPOFOL;  Surgeon: Wilford Corner, MD;  [facility];  Service: Endoscopy;  Laterality: N/A;     A IV Location/Drains/Wounds Patient  Lines/Drains/Airways Status     Active Line/Drains/Airways     Name Placement date Placement time Site Days   Peripheral IV 01/21/22 18 G Anterior;Left Forearm 01/21/22  1600  Forearm  less than 1   External Urinary Catheter 01/21/22  0936  --  less than 1            Intake/Output Last 24 hours  Intake/Output Summary (Last 24 hours) at 01/21/2022 1720 Last data filed at 01/21/2022 1227 Gross per 24 hour  Intake 600 ml  Output --  Net 600 ml    Labs/Imaging Results for orders placed or performed during the hospital encounter of 01/21/22 (from the past 48 hour(s))  CBC with Differential     Status: Abnormal   Collection Time: 01/21/22  9:27 AM  Result Value Ref Range   WBC 10.6 (H) 4.0 - 10.5 K/uL   RBC 4.32 3.87 - 5.11 MIL/uL   Hemoglobin 13.3 12.0 - 15.0 g/dL   HCT 41.0 36.0 - 46.0 %   MCV 94.9 80.0 - 100.0 fL   MCH 30.8 26.0 - 34.0 pg   MCHC 32.4 30.0 - 36.0 g/dL   RDW 13.8 11.5 - 15.5 %   Platelets 386 150 - 400 K/uL   nRBC 0.0 0.0 - 0.2 %   Neutrophils Relative % 85 %   Neutro Abs 9.0 (H) 1.7 - 7.7 K/uL   Lymphocytes Relative 10 %   Lymphs Abs 1.0 0.7 - 4.0 K/uL   Monocytes Relative 3 %   Monocytes Absolute 0.3 0.1 -  1.0 K/uL   Eosinophils Relative 1 %   Eosinophils Absolute 0.1 0.0 - 0.5 K/uL   Basophils Relative 1 %   Basophils Absolute 0.1 0.0 - 0.1 K/uL   Immature Granulocytes 0 %   Abs Immature Granulocytes 0.03 0.00 - 0.07 K/uL    Comment: Performed at [facility] [address], [place], [place] [address]  Comprehensive metabolic panel     Status: Abnormal   Collection Time: 01/21/22  9:27 AM  Result Value Ref Range   Sodium 140 135 - 145 mmol/L   Potassium 4.1 3.5 - 5.1 mmol/L   Chloride 105 98 - 111 mmol/L   CO2 25 22 - 32 mmol/L   Glucose, Bld 121 (H) 70 - 99 mg/dL    Comment: Glucose reference range applies only to samples taken after fasting for at least 8 hours.   BUN 19 8 - 23 mg/dL   Creatinine, Ser 0.92 0.44 - 1.00 mg/dL   Calcium  10.3 8.9 - 10.3 mg/dL   Total Protein 7.2 6.5 - 8.1 g/dL   Albumin 3.6 3.5 - 5.0 g/dL   AST 18 15 - 41 U/L   ALT 11 0 - 44 U/L   Alkaline Phosphatase 79 38 - 126 U/L   Total Bilirubin 0.2 (L) 0.3 - 1.2 mg/dL   GFR, Estimated >60 >60 mL/min    Comment: (NOTE) Calculated using the CKD-EPI Creatinine Equation (2021)    Anion gap 10 5 - 15    Comment: Performed at [facility] [address], [place], [place] [address]  Lipase, blood     Status: None   Collection Time: 01/21/22  9:27 AM  Result Value Ref Range   Lipase 31 11 - 51 U/L    Comment: Performed at [facility] [address] [place], [place] [address]  Lactic acid, plasma     Status: Abnormal   Collection Time: 01/21/22  9:27 AM  Result Value Ref Range   Lactic Acid, Venous 2.0 (HH) 0.5 - 1.9 mmol/L    Comment: CRITICAL RESULT CALLED TO, READ BACK BY AND VERIFIED WITH M. ROBERTS RN 01/21/22 '@1032'$  BY J. WHITE Performed at [facility] [address] [place], [place] [address]   Lactic acid, plasma     Status: None   Collection Time: 01/21/22 10:59 AM  Result Value Ref Range   Lactic Acid, Venous 1.9 0.5 - 1.9 mmol/L    Comment: Performed at [facility] [address] [place], [place] [address]   CT Angio Chest PE W and/or Wo Contrast  Result Date: 01/21/2022 CLINICAL DATA:  Abdominal pain nonlocalized also concern for aspiration pneumonia. History of esophageal cancer EXAM: CT ANGIOGRAPHY CHEST CT ABDOMEN AND PELVIS WITH CONTRAST TECHNIQUE: Multidetector CT imaging of the chest was performed using the standard protocol during bolus administration of intravenous contrast. Multiplanar CT image reconstructions and MIPs were obtained to evaluate the vascular anatomy. Multidetector CT imaging of the abdomen and pelvis was performed using the standard protocol during bolus administration of intravenous contrast. RADIATION DOSE REDUCTION: This exam was performed according to the departmental  dose-optimization program which includes automated exposure control, adjustment of the mA and/or kV according to patient size and/or use of iterative reconstruction technique. CONTRAST:  27m OMNIPAQUE IOHEXOL 350 MG/ML SOLN COMPARISON:  Multiple priors including outside CTs of the chest abdomen pelvis dated August 03, 2021 FINDINGS: CTA CHEST FINDINGS Cardiovascular: Satisfactory opacification of the pulmonary arteries to  the segmental level. No evidence of pulmonary embolism. Aortic atherosclerosis. No thoracic aortic aneurysm. Normal heart size. No pericardial effusion. Mediastinum/Nodes: There is some thickening of the proximal/mid esophagus for instance on image 26/6 which may reflect patient's known esophageal neoplasm. No supraclavicular adenopathy. No discrete thyroid nodule. No pathologically enlarged mediastinal, hilar or axillary lymph nodes. Lungs/Pleura: Biapical pleuroparenchymal scarring. Scarring in the paramedian/paramediastinal scarring in the upper and lower lobes is new from prior and likely reflect sequela of radiation therapy. Patchy nodular ground-glass opacities in the dependent lungs for instance on image 61/7 and 104/7 in the right lower lobe on image 60/7 in the left lower lobe. As well there are new multifocal bilateral pulmonary nodules. Reference nodules are as follows: -left lower lobe measuring 5 mm on image 94/7 -Right lower lobe pulmonary nodule measures 6 mm on image 59/7. -Right upper lobe pulmonary nodule measures 3 mm pulmonary nodule on image 59/7. Diffuse bronchial wall thickening with mosaic attenuation of the lungs may reflect small airways disease. Musculoskeletal: No aggressive lytic or blastic lesion of bone. No acute osseous abnormality. Review of the MIP images confirms the above findings. CT ABDOMEN and PELVIS FINDINGS Imaging of the abdomen and pelvis is slightly degraded by motion. Hepatobiliary: Ill-defined hypodensity along the falciform ligament in segment IV is  similar prior and commonly reflects fatty infiltration or differential perfusion (veins of [place]). No suspicious hepatic lesion. Gallbladder is unremarkable. No biliary ductal dilation. Pancreas: No pancreatic ductal dilation or evidence of acute inflammation. Spleen: No splenomegaly or focal splenic lesion. Adrenals/Urinary Tract: Similar bilateral adrenal thickening without discrete nodularity, favor hyperplasia. No hydronephrosis. Kidneys demonstrate symmetric enhancement and excretion of contrast material. Urinary bladder is unremarkable for degree of distension. Stomach/Bowel: No radiopaque enteric contrast material was administered. Large rectal stool ball with rectal wall thickening and perirectal stranding consistent with stercoral colitis. 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 Stomach is nondistended limiting evaluation. No pathologic dilation of small or large bowel. Colonic diverticulosis without findings of acute diverticulitis. Vascular/Lymphatic: Aortic atherosclerosis. No pathologically enlarged abdominal or pelvic lymph nodes. Reproductive: Similar lobular enhancement of the uterus consistent with uterine leiomyomas. No suspicious adnexal mass. Other: No significant abdominopelvic free fluid. No discrete peritoneal or omental nodularity. Musculoskeletal: No aggressive lytic or blastic lesion of bone. Mild multilevel degenerative changes spine. Review of the MIP images confirms the above findings. IMPRESSION: 1. No pulmonary embolus. 2. New bilateral multifocal pulmonary nodules and ground-glass opacities which may be infectious or inflammatory, some of which are located in the dependent lungs and may reflect a component of aspiration. However, metastatic disease involvement is a pertinent differential consideration not excluded on this examination. Suggest short-term interval follow-up with dedicated chest CT. 3. Diffuse bronchial wall thickening with  mosaic attenuation of the lungs suggestive of small airways disease. 4. New patchy ground-glass opacities in the paramedian/paramediastinal bilateral upper and lower lobes likely reflecting postradiation change. 5. Wall thickening of the proximal/mid esophagus may reflect patient's known esophageal neoplasm. 6. Large stool ball in the rectum with findings that can be seen with stercoral colitis. 7.  Aortic Atherosclerosis (ICD10-I70.0). Electronically Signed   By: Dahlia Bailiff M.D.   On: 01/21/2022 13:30   CT ABDOMEN PELVIS W CONTRAST  Result Date: 01/21/2022 CLINICAL DATA:  Abdominal pain nonlocalized also concern for aspiration pneumonia. History of esophageal cancer EXAM: CT ANGIOGRAPHY CHEST CT ABDOMEN AND PELVIS WITH CONTRAST TECHNIQUE: Multidetector CT imaging of the chest was performed using the standard protocol  during bolus administration of intravenous contrast. Multiplanar CT image reconstructions and MIPs were obtained to evaluate the vascular anatomy. Multidetector CT imaging of the abdomen and pelvis was performed using the standard protocol during bolus administration of intravenous contrast. RADIATION DOSE REDUCTION: This exam was performed according to the departmental dose-optimization program which includes automated exposure control, adjustment of the mA and/or kV according to patient size and/or use of iterative reconstruction technique. CONTRAST:  66m OMNIPAQUE IOHEXOL 350 MG/ML SOLN COMPARISON:  Multiple priors including outside CTs of the chest abdomen pelvis dated August 03, 2021 FINDINGS: CTA CHEST FINDINGS Cardiovascular: Satisfactory opacification of the pulmonary arteries to the segmental level. No evidence of pulmonary embolism. Aortic atherosclerosis. No thoracic aortic aneurysm. Normal heart size. No pericardial effusion. Mediastinum/Nodes: There is some thickening of the proximal/mid esophagus for instance on image 26/6 which may reflect patient's known esophageal neoplasm. No  supraclavicular adenopathy. No discrete thyroid nodule. No pathologically enlarged mediastinal, hilar or axillary lymph nodes. Lungs/Pleura: Biapical pleuroparenchymal scarring. Scarring in the paramedian/paramediastinal scarring in the upper and lower lobes is new from prior and likely reflect sequela of radiation therapy. Patchy nodular ground-glass opacities in the dependent lungs for instance on image 61/7 and 104/7 in the right lower lobe on image 60/7 in the left lower lobe. As well there are new multifocal bilateral pulmonary nodules. Reference nodules are as follows: -left lower lobe measuring 5 mm on image 94/7 -Right lower lobe pulmonary nodule measures 6 mm on image 59/7. -Right upper lobe pulmonary nodule measures 3 mm pulmonary nodule on image 59/7. Diffuse bronchial wall thickening with mosaic attenuation of the lungs may reflect small airways disease. Musculoskeletal: No aggressive lytic or blastic lesion of bone. No acute osseous abnormality. Review of the MIP images confirms the above findings. CT ABDOMEN and PELVIS FINDINGS Imaging of the abdomen and pelvis is slightly degraded by motion. Hepatobiliary: Ill-defined hypodensity along the falciform ligament in segment IV is similar prior and commonly reflects fatty infiltration or differential perfusion (veins of [place]appey). No suspicious hepatic lesion. Gallbladder is unremarkable. No biliary ductal dilation. Pancreas: No pancreatic ductal dilation or evidence of acute inflammation. Spleen: No splenomegaly or focal splenic lesion. Adrenals/Urinary Tract: Similar bilateral adrenal thickening without discrete nodularity, favor hyperplasia. No hydronephrosis. Kidneys demonstrate symmetric enhancement and excretion of contrast material. Urinary bladder is unremarkable for degree of distension. Stomach/Bowel: No radiopaque enteric contrast material was administered. Large rectal stool ball with rectal wall thickening and perirectal stranding consistent with  stercoral colitis. 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 Stomach is nondistended limiting evaluation. No pathologic dilation of small or large bowel. Colonic diverticulosis without findings of acute diverticulitis. Vascular/Lymphatic: Aortic atherosclerosis. No pathologically enlarged abdominal or pelvic lymph nodes. Reproductive: Similar lobular enhancement of the uterus consistent with uterine leiomyomas. No suspicious adnexal mass. Other: No significant abdominopelvic free fluid. No discrete peritoneal or omental nodularity. Musculoskeletal: No aggressive lytic or blastic lesion of bone. Mild multilevel degenerative changes spine. Review of the MIP images confirms the above findings. IMPRESSION: 1. No pulmonary embolus. 2. New bilateral multifocal pulmonary nodules and ground-glass opacities which may be infectious or inflammatory, some of which are located in the dependent lungs and may reflect a component of aspiration. However, metastatic disease involvement is a pertinent differential consideration not excluded on this examination. Suggest short-term interval follow-up with dedicated chest CT. 3. Diffuse bronchial wall thickening with mosaic attenuation of the lungs suggestive of small airways disease. 4. New patchy ground-glass opacities  in the paramedian/paramediastinal bilateral upper and lower lobes likely reflecting postradiation change. 5. Wall thickening of the proximal/mid esophagus may reflect patient's known esophageal neoplasm. 6. Large stool ball in the rectum with findings that can be seen with stercoral colitis. 7.  Aortic Atherosclerosis (ICD10-I70.0). Electronically Signed   By: Dahlia Bailiff M.D.   On: 01/21/2022 13:30   DG Neck Soft Tissue  Result Date: 01/21/2022 CLINICAL DATA:  Hypoxia Aspiration EXAM: NECK SOFT TISSUES - 1+ VIEW COMPARISON:  None available FINDINGS: The prevertebral soft tissues are normal in thickness. No radiopaque  foreign body. Degenerative changes seen throughout the cervical spine, greatest at C5-C6. Evaluation of epiglottis limited due to overlying external artifact. It does not appear significantly thickened. IMPRESSION: No significant abnormality of the neck soft tissues. If there is continued clinical concern for neck soft tissue pathology, further evaluation with contrast enhanced soft tissue neck CT should be performed. Electronically Signed   By: Miachel Roux M.D.   On: 01/21/2022 10:26   DG Chest 2 View  Result Date: 01/21/2022 CLINICAL DATA:  Hypoxia, aspiration. EXAM: CHEST - 2 VIEW COMPARISON:  Chest x-ray dated 07/10/2021 FINDINGS: Heart size and mediastinal contours are within normal limits. Lungs appear hyperexpanded. Lungs are clear. No pleural effusion or pneumothorax is seen. No acute-appearing osseous abnormality. Chronic appearing rib fractures bilaterally. IMPRESSION: 1. No active cardiopulmonary disease. No evidence of pneumonia or pulmonary edema. 2. Hyperexpanded lungs suggesting COPD. Electronically Signed   By: Franki Cabot M.D.   On: 01/21/2022 10:24    Pending Labs Unresulted Labs (From admission, onward)     Start     Ordered   01/22/22 0500  CBC  Tomorrow morning,   R        01/21/22 1352   01/21/22 0912  Blood culture (routine x 2)  BLOOD CULTURE X 2,   R      01/21/22 0911            Vitals/Pain Today's Vitals   01/21/22 1230 01/21/22 1315 01/21/22 1317 01/21/22 1623  BP: (!) 138/53   135/72  Pulse: (!) 55 62  (!) 112  Resp: '19 20  18  '$ Temp:   98.5 F (36.9 C)   TempSrc:   Oral   SpO2: 96% 97%  94%  Weight:      Height:      PainSc:        Isolation Precautions No active isolations  Medications Medications  ampicillin-sulbactam (UNASYN) 1.5 g in sodium chloride 0.9 % 100 mL IVPB (0 g Intravenous Stopped 01/21/22 1227)  OLANZapine (ZYPREXA) injection 2.5 mg (has no administration in time range)  enoxaparin (LOVENOX) injection 30 mg (has no administration  in time range)  ipratropium-albuterol (DUONEB) 0.5-2.5 (3) MG/3ML nebulizer solution 3 mL (has no administration in time range)  acetaminophen (TYLENOL) tablet 650 mg (has no administration in time range)  lisinopril (ZESTRIL) tablet 2.5 mg (has no administration in time range)  pravastatin (PRAVACHOL) tablet 10 mg (has no administration in time range)  donepezil (ARICEPT) tablet 10 mg (has no administration in time range)  sertraline (ZOLOFT) tablet 50 mg (has no administration in time range)  QUEtiapine (SEROQUEL) tablet 25 mg (has no administration in time range)  traZODone (DESYREL) tablet 50 mg (has no administration in time range)  bisacodyl (DULCOLAX) suppository 10 mg (has no administration in time range)  loperamide (IMODIUM) capsule 2 mg (has no administration in time range)  ondansetron (ZOFRAN) tablet 4 mg (has no administration in  time range)  pantoprazole (PROTONIX) EC tablet 40 mg (has no administration in time range)  polyethylene glycol (MIRALAX / GLYCOLAX) packet 17 g (has no administration in time range)  senna-docusate (Senokot-S) tablet 2 tablet (has no administration in time range)  ondansetron (ZOFRAN) injection 4 mg (4 mg Intravenous Given 01/21/22 0921)  glucagon (human recombinant) (GLUCAGEN) injection 1 mg (1 mg Intravenous Given 01/21/22 0923)  lactated ringers bolus 500 mL (0 mLs Intravenous Stopped 01/21/22 1125)  iohexol (OMNIPAQUE) 350 MG/ML injection 80 mL (80 mLs Intravenous Contrast Given 01/21/22 1302)  LORazepam (ATIVAN) injection 1 mg (1 mg Intravenous Given 01/21/22 1553)  haloperidol lactate (HALDOL) injection 2 mg (2 mg Intravenous Given 01/21/22 1611)    Mobility  High fall risk   Focused Assessments     R Recommendations: See Admitting Provider Note  Report given to:   Additional Notes:

## 2022-01-21 NOTE — ED Notes (Signed)
Patient upon arrival was dry heaving and gagging and end up coughing up a medium size piece of sausage. Patient now appears in no distress and is not drooling anymore.

## 2022-01-21 NOTE — ED Notes (Signed)
Patient heard screaming from her room. I found the patient screaming and sitting on the edge of the bed trying to get out. Safety Sitter was attempting to prevent the patient from getting out of bed and falling. Patient was scooted back into bed. Patient became increasingly agitated and violent towards staff. EDP gave verbal order for '1mg'$  of ativan. Ativan administered. Patient continued to be violent towards staff, so admitting doctor placed a order for soft restraints and '2mg'$  haldol.   Soft restraints placed without incident and PMS intact after application.

## 2022-01-21 NOTE — ED Notes (Signed)
Family updated as to patient's status.

## 2022-01-21 NOTE — ED Notes (Signed)
Trialing off oxygen patient desat to 86% Placed back on 3L [place]

## 2022-01-21 NOTE — ED Triage Notes (Signed)
Hx of throat cancer suppose to be on puree food.  Patient coming from [facility]  Patient was given diced sausage and now coughing and drooling.  Family had requested her be on regular food.  Patient feels there is something stuck in throat but she can't get it out.

## 2022-01-21 NOTE — H&P (Signed)
History and Physical    Sarah Gutierrez JTT:[id number] DOB: 04-23-1946 DOA: 01/21/2022  PCP: [facility] (Confirm with patient/family/NH records and if not entered, this has to be entered at [facility] point of entry) Patient coming from: SNF  I have personally briefly reviewed patient's old medical records in [place]  Chief Complaint: SOB  HPI: Sarah Gutierrez is a 76 y.o. 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  Patient developed dysphagia earlier this year and went to see GI, and EGD showed cancer-like changes in esophageal.  Subsequently, she was was diagnosed with esophageal cancer and underwent palliative radiation in March-April of this year, and has been following with oncology for that.  Patient has been on pured diet, and there is no plan for PEG tube as per family's request.  This morning however patient was fed with a meal including some sausages and patient clearly choked and became hypoxic after eating few pieces of sausage and EMS was called immediately, and patient was found O2 saturation 82% on room air.  ED Course: ED was able to pull out pieces of sausages from patient's throat and oxygen level improved on 2 L oxygen.  CT angiogram negative for PE, multifocal nodular changes likely pneumonia versus metastasis.  Patient was given some IV fluids and Unasyn started in the ED.  Review of Systems: Unable to perform, baseline demented.  Past Medical History:  Diagnosis Date   Cognitive impairment    Encephalopathy    Esophagus cancer ([place]) 06/02/2021   Hypertension     Past Surgical History:  Procedure Laterality Date   ABDOMINAL HYSTERECTOMY     BIOPSY  12/14/2020   Procedure: BIOPSY;  Surgeon: Wilford Corner, MD;  Location: [facility]  ENDOSCOPY;  Service: Endoscopy;;   ESOPHAGOGASTRODUODENOSCOPY (EGD) WITH PROPOFOL N/A 12/14/2020   Procedure: ESOPHAGOGASTRODUODENOSCOPY (EGD) WITH PROPOFOL;  Surgeon: Wilford Corner, MD;  Location: [facility] ENDOSCOPY;  Service: Endoscopy;  Laterality: N/A;     reports that she quit smoking about 3 years ago. Her smoking use included cigarettes. She has a 7.50 pack-year smoking history. She has never used smokeless tobacco. She reports that she does not currently use alcohol. She reports that she does not use drugs.  Allergies  Allergen Reactions   Oyster Extract    Oysters [Shellfish Allergy] Nausea And Vomiting    Family History  Problem Relation Age of Onset   Throat cancer Father    Cancer Father    CAD Brother 7       has coronary stent    Mesothelioma Brother    Cancer Brother    Hypertension Sister      Prior to Admission medications   Medication Sig Start Date End Date Taking? Authorizing Provider  acetaminophen (TYLENOL) 325 MG tablet Take 650 mg by mouth every 6 (six) hours as needed for mild pain or moderate pain.    [provider]  bisacodyl (DULCOLAX) 10 MG suppository Place 10 mg rectally as needed for moderate constipation.    [provider]  donepezil (ARICEPT) 10 MG tablet Take 1 tablet (10 mg total) by mouth at bedtime. 05/27/21   Rondel Jumbo, PA-C  lisinopril (ZESTRIL) 2.5 MG tablet Take 2.5 mg by mouth daily. 06/05/21   [provider]  loperamide (IMODIUM) 2 MG capsule Take 2 mg by  mouth 3 (three) times daily as needed for diarrhea or loose stools.    [provider]  mineral oil liquid Take 15 mLs by mouth See admin instructions. Take 15 ml by mouth at bedtime twice weekly as needed    [provider]  Multiple Vitamins-Minerals (MULTIVITAMIN PO) Take 1 tablet by mouth daily.    [provider]  NON FORMULARY Take 2.5 mLs by mouth daily. Tooth and gum tonic use 1/2 teaspoon as directed    [provider]  ondansetron (ZOFRAN) 4 MG tablet Take 4 mg by mouth every 6 (six) hours as needed for nausea or vomiting.    [provider]  pantoprazole (PROTONIX) 40 MG tablet Take 1 tablet (40 mg total) by mouth daily. 09/21/21   Truitt Merle, MD  polyethylene glycol [facility]) packet Take 17 g by mouth daily. 06/01/18   Dessa Phi, DO  pravastatin (PRAVACHOL) 10 MG tablet Take 10 mg by mouth every evening.    [provider]  QUEtiapine (SEROQUEL) 25 MG tablet Take by mouth.    [provider]  senna-docusate (SENOKOT-S) 8.6-50 MG tablet Take 1 tablet by mouth daily. Patient taking differently: Take 2 tablets by mouth at bedtime. 06/01/18   Dessa Phi, DO  sertraline (ZOLOFT) 50 MG tablet Take 50 mg by mouth daily.    [provider]  thiamine 100 MG tablet Take 100 mg by mouth daily.    [provider]  traZODone (DESYREL) 50 MG tablet Take 1 tablet (50 mg total) by mouth 3 (three) times daily as needed (agitation). 08/04/19   Tegeler, Gwenyth Allegra, MD  triamcinolone cream (KENALOG) 0.1 % Apply 1 application topically 3 (three) times daily.    [provider]    Physical Exam: Vitals:   01/21/22 1200 01/21/22 1230 01/21/22 1315 01/21/22 1317  BP: 111/84 (!) 138/53    Pulse: 79 (!) 55 62   Resp: '18 19 20   '$ Temp:    98.5 F (36.9 C)  TempSrc:    Oral  SpO2: 100% 96% 97%   Weight:      Height:        Constitutional: NAD, calm, comfortable Vitals:   01/21/22 1200 01/21/22 1230 01/21/22 1315 01/21/22 1317  BP: 111/84 (!) 138/53    Pulse: 79 (!) 55 62   Resp: '18 19 20   '$ Temp:    98.5 F (36.9 C)  TempSrc:    Oral  SpO2: 100% 96% 97%   Weight:      Height:       Eyes: PERRL, lids and conjunctivae normal ENMT: Mucous membranes are moist. Posterior pharynx clear of any exudate or lesions.Normal dentition.  Neck: normal, supple, no masses, no thyromegaly Respiratory: clear to auscultation bilaterally, no  wheezing, scattered crackles bilaterally, increasing breathing effort. No accessory muscle use.  Cardiovascular: Regular rate and rhythm, no murmurs / rubs / gallops. No extremity edema. 2+ pedal pulses. No carotid bruits.  Abdomen: no tenderness, no masses palpated. No hepatosplenomegaly. Bowel sounds positive.  Musculoskeletal: no clubbing / cyanosis. No joint deformity upper and lower extremities. Good ROM, no contractures. Normal muscle tone.  Skin: no rashes, lesions, ulcers. No induration Neurologic: CN 2-12 grossly intact. Sensation intact, DTR normal. Strength 5/5 in all 4.  Psychiatric: Oriented to herself, confused about time and place.    Labs on Admission: I have personally reviewed following labs and imaging studies  CBC: Recent Labs  Lab 01/21/22 0927  WBC 10.6*  NEUTROABS 9.0*  HGB 13.3  HCT 41.0  MCV 94.9  PLT 720   Basic Metabolic Panel: Recent Labs  Lab 01/21/22 0927  NA 140  K 4.1  CL 105  CO2 25  GLUCOSE 121*  BUN 19  CREATININE 0.92  CALCIUM 10.3   GFR: Estimated Creatinine Clearance: 42.4 mL/min (by C-G formula based on SCr of 0.92 mg/dL). Liver Function Tests: Recent Labs  Lab 01/21/22 0927  AST 18  ALT 11  ALKPHOS 79  BILITOT 0.2*  PROT 7.2  ALBUMIN 3.6   Recent Labs  Lab 01/21/22 0927  LIPASE 31   No results for input(s): "AMMONIA" in the last 168 hours. Coagulation Profile: No results for input(s): "INR", "PROTIME" in the last 168 hours. Cardiac Enzymes: No results for input(s): "CKTOTAL", "CKMB", "CKMBINDEX", "TROPONINI" in the last 168 hours. BNP (last 3 results) No results for input(s): "PROBNP" in the last 8760 hours. HbA1C: No results for input(s): "HGBA1C" in the last 72 hours. CBG: No results for input(s): "GLUCAP" in the last 168 hours. Lipid Profile: No results for input(s): "CHOL", "HDL", "LDLCALC", "TRIG", "CHOLHDL", "LDLDIRECT" in the last 72 hours. Thyroid Function Tests: No results for input(s): "TSH",  "T4TOTAL", "FREET4", "T3FREE", "THYROIDAB" in the last 72 hours. Anemia Panel: No results for input(s): "VITAMINB12", "FOLATE", "FERRITIN", "TIBC", "IRON", "RETICCTPCT" in the last 72 hours. Urine analysis:    Component Value Date/Time   COLORURINE YELLOW 07/12/2021 2349   APPEARANCEUR CLEAR 07/12/2021 2349   LABSPEC 1.025 07/12/2021 2349   PHURINE 6.0 07/12/2021 2349   GLUCOSEU NEGATIVE 07/12/2021 2349   HGBUR NEGATIVE 07/12/2021 2349   BILIRUBINUR NEGATIVE 07/12/2021 2349   KETONESUR NEGATIVE 07/12/2021 2349   PROTEINUR NEGATIVE 07/12/2021 2349   NITRITE NEGATIVE 07/12/2021 2349   LEUKOCYTESUR NEGATIVE 07/12/2021 2349    Radiological Exams on Admission: CT Angio Chest PE W and/or Wo Contrast  Result Date: 01/21/2022 CLINICAL DATA:  Abdominal pain nonlocalized also concern for aspiration pneumonia. History of esophageal cancer EXAM: CT ANGIOGRAPHY CHEST CT ABDOMEN AND PELVIS WITH CONTRAST TECHNIQUE: Multidetector CT imaging of the chest was performed using the standard protocol during bolus administration of intravenous contrast. Multiplanar CT image reconstructions and MIPs were obtained to evaluate the vascular anatomy. Multidetector CT imaging of the abdomen and pelvis was performed using the standard protocol during bolus administration of intravenous contrast. RADIATION DOSE REDUCTION: This exam was performed according to the departmental dose-optimization program which includes automated exposure control, adjustment of the mA and/or kV according to patient size and/or use of iterative reconstruction technique. CONTRAST:  30m OMNIPAQUE IOHEXOL 350 MG/ML SOLN COMPARISON:  Multiple priors including outside CTs of the chest abdomen pelvis dated August 03, 2021 FINDINGS: CTA CHEST FINDINGS Cardiovascular: Satisfactory opacification of the pulmonary arteries to the segmental level. No evidence of pulmonary embolism. Aortic atherosclerosis. No thoracic aortic aneurysm. Normal heart size. No  pericardial effusion. Mediastinum/Nodes: There is some thickening of the proximal/mid esophagus for instance on image 26/6 which may reflect patient's known esophageal neoplasm. No supraclavicular adenopathy. No discrete thyroid nodule. No pathologically enlarged mediastinal, hilar or axillary lymph nodes. Lungs/Pleura: Biapical pleuroparenchymal scarring. Scarring in the paramedian/paramediastinal scarring in the upper and lower lobes is new from prior and likely reflect sequela of radiation therapy. Patchy nodular ground-glass opacities in the dependent lungs for instance on image 61/7 and 104/7 in the right lower lobe on image 60/7 in the left lower lobe. As well there are new multifocal bilateral pulmonary nodules. Reference nodules are as follows: -left lower  lobe measuring 5 mm on image 94/7 -Right lower lobe pulmonary nodule measures 6 mm on image 59/7. -Right upper lobe pulmonary nodule measures 3 mm pulmonary nodule on image 59/7. Diffuse bronchial wall thickening with mosaic attenuation of the lungs may reflect small airways disease. Musculoskeletal: No aggressive lytic or blastic lesion of bone. No acute osseous abnormality. Review of the MIP images confirms the above findings. CT ABDOMEN and PELVIS FINDINGS Imaging of the abdomen and pelvis is slightly degraded by motion. Hepatobiliary: Ill-defined hypodensity along the falciform ligament in segment IV is similar prior and commonly reflects fatty infiltration or differential perfusion (veins of [place]). No suspicious hepatic lesion. Gallbladder is unremarkable. No biliary ductal dilation. Pancreas: No pancreatic ductal dilation or evidence of acute inflammation. Spleen: No splenomegaly or focal splenic lesion. Adrenals/Urinary Tract: Similar bilateral adrenal thickening without discrete nodularity, favor hyperplasia. No hydronephrosis. Kidneys demonstrate symmetric enhancement and excretion of contrast material. Urinary bladder is unremarkable for degree  of distension. Stomach/Bowel: No radiopaque enteric contrast material was administered. Large rectal stool ball with rectal wall thickening and perirectal stranding consistent with stercoral colitis. 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 Stomach is nondistended limiting evaluation. No pathologic dilation of small or large bowel. Colonic diverticulosis without findings of acute diverticulitis. Vascular/Lymphatic: Aortic atherosclerosis. No pathologically enlarged abdominal or pelvic lymph nodes. Reproductive: Similar lobular enhancement of the uterus consistent with uterine leiomyomas. No suspicious adnexal mass. Other: No significant abdominopelvic free fluid. No discrete peritoneal or omental nodularity. Musculoskeletal: No aggressive lytic or blastic lesion of bone. Mild multilevel degenerative changes spine. Review of the MIP images confirms the above findings. IMPRESSION: 1. No pulmonary embolus. 2. New bilateral multifocal pulmonary nodules and ground-glass opacities which may be infectious or inflammatory, some of which are located in the dependent lungs and may reflect a component of aspiration. However, metastatic disease involvement is a pertinent differential consideration not excluded on this examination. Suggest short-term interval follow-up with dedicated chest CT. 3. Diffuse bronchial wall thickening with mosaic attenuation of the lungs suggestive of small airways disease. 4. New patchy ground-glass opacities in the paramedian/paramediastinal bilateral upper and lower lobes likely reflecting postradiation change. 5. Wall thickening of the proximal/mid esophagus may reflect patient's known esophageal neoplasm. 6. Large stool ball in the rectum with findings that can be seen with stercoral colitis. 7.  Aortic Atherosclerosis (ICD10-I70.0). Electronically Signed   By: Dahlia Bailiff M.D.   On: 01/21/2022 13:30   CT ABDOMEN PELVIS W CONTRAST  Result Date:  01/21/2022 CLINICAL DATA:  Abdominal pain nonlocalized also concern for aspiration pneumonia. History of esophageal cancer EXAM: CT ANGIOGRAPHY CHEST CT ABDOMEN AND PELVIS WITH CONTRAST TECHNIQUE: Multidetector CT imaging of the chest was performed using the standard protocol during bolus administration of intravenous contrast. Multiplanar CT image reconstructions and MIPs were obtained to evaluate the vascular anatomy. Multidetector CT imaging of the abdomen and pelvis was performed using the standard protocol during bolus administration of intravenous contrast. RADIATION DOSE REDUCTION: This exam was performed according to the departmental dose-optimization program which includes automated exposure control, adjustment of the mA and/or kV according to patient size and/or use of iterative reconstruction technique. CONTRAST:  50m OMNIPAQUE IOHEXOL 350 MG/ML SOLN COMPARISON:  Multiple priors including outside CTs of the chest abdomen pelvis dated August 03, 2021 FINDINGS: CTA CHEST FINDINGS Cardiovascular: Satisfactory opacification of the pulmonary arteries to the segmental level. No evidence of pulmonary embolism. Aortic atherosclerosis. No thoracic aortic aneurysm. Normal heart size. No  pericardial effusion. Mediastinum/Nodes: There is some thickening of the proximal/mid esophagus for instance on image 26/6 which may reflect patient's known esophageal neoplasm. No supraclavicular adenopathy. No discrete thyroid nodule. No pathologically enlarged mediastinal, hilar or axillary lymph nodes. Lungs/Pleura: Biapical pleuroparenchymal scarring. Scarring in the paramedian/paramediastinal scarring in the upper and lower lobes is new from prior and likely reflect sequela of radiation therapy. Patchy nodular ground-glass opacities in the dependent lungs for instance on image 61/7 and 104/7 in the right lower lobe on image 60/7 in the left lower lobe. As well there are new multifocal bilateral pulmonary nodules. Reference  nodules are as follows: -left lower lobe measuring 5 mm on image 94/7 -Right lower lobe pulmonary nodule measures 6 mm on image 59/7. -Right upper lobe pulmonary nodule measures 3 mm pulmonary nodule on image 59/7. Diffuse bronchial wall thickening with mosaic attenuation of the lungs may reflect small airways disease. Musculoskeletal: No aggressive lytic or blastic lesion of bone. No acute osseous abnormality. Review of the MIP images confirms the above findings. CT ABDOMEN and PELVIS FINDINGS Imaging of the abdomen and pelvis is slightly degraded by motion. Hepatobiliary: Ill-defined hypodensity along the falciform ligament in segment IV is similar prior and commonly reflects fatty infiltration or differential perfusion (veins of [place]). No suspicious hepatic lesion. Gallbladder is unremarkable. No biliary ductal dilation. Pancreas: No pancreatic ductal dilation or evidence of acute inflammation. Spleen: No splenomegaly or focal splenic lesion. Adrenals/Urinary Tract: Similar bilateral adrenal thickening without discrete nodularity, favor hyperplasia. No hydronephrosis. Kidneys demonstrate symmetric enhancement and excretion of contrast material. Urinary bladder is unremarkable for degree of distension. Stomach/Bowel: No radiopaque enteric contrast material was administered. Large rectal stool ball with rectal wall thickening and perirectal stranding consistent with stercoral colitis. 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 Stomach is nondistended limiting evaluation. No pathologic dilation of small or large bowel. Colonic diverticulosis without findings of acute diverticulitis. Vascular/Lymphatic: Aortic atherosclerosis. No pathologically enlarged abdominal or pelvic lymph nodes. Reproductive: Similar lobular enhancement of the uterus consistent with uterine leiomyomas. No suspicious adnexal mass. Other: No significant abdominopelvic free fluid. No discrete  peritoneal or omental nodularity. Musculoskeletal: No aggressive lytic or blastic lesion of bone. Mild multilevel degenerative changes spine. Review of the MIP images confirms the above findings. IMPRESSION: 1. No pulmonary embolus. 2. New bilateral multifocal pulmonary nodules and ground-glass opacities which may be infectious or inflammatory, some of which are located in the dependent lungs and may reflect a component of aspiration. However, metastatic disease involvement is a pertinent differential consideration not excluded on this examination. Suggest short-term interval follow-up with dedicated chest CT. 3. Diffuse bronchial wall thickening with mosaic attenuation of the lungs suggestive of small airways disease. 4. New patchy ground-glass opacities in the paramedian/paramediastinal bilateral upper and lower lobes likely reflecting postradiation change. 5. Wall thickening of the proximal/mid esophagus may reflect patient's known esophageal neoplasm. 6. Large stool ball in the rectum with findings that can be seen with stercoral colitis. 7.  Aortic Atherosclerosis (ICD10-I70.0). Electronically Signed   By: Dahlia Bailiff M.D.   On: 01/21/2022 13:30   DG Neck Soft Tissue  Result Date: 01/21/2022 CLINICAL DATA:  Hypoxia Aspiration EXAM: NECK SOFT TISSUES - 1+ VIEW COMPARISON:  None available FINDINGS: The prevertebral soft tissues are normal in thickness. No radiopaque foreign body. Degenerative changes seen throughout the cervical spine, greatest at C5-C6. Evaluation of epiglottis limited due to overlying external artifact. It does not appear significantly thickened. IMPRESSION: No significant  abnormality of the neck soft tissues. If there is continued clinical concern for neck soft tissue pathology, further evaluation with contrast enhanced soft tissue neck CT should be performed. Electronically Signed   By: Miachel Roux M.D.   On: 01/21/2022 10:26   DG Chest 2 View  Result Date: 01/21/2022 CLINICAL DATA:   Hypoxia, aspiration. EXAM: CHEST - 2 VIEW COMPARISON:  Chest x-ray dated 07/10/2021 FINDINGS: Heart size and mediastinal contours are within normal limits. Lungs appear hyperexpanded. Lungs are clear. No pleural effusion or pneumothorax is seen. No acute-appearing osseous abnormality. Chronic appearing rib fractures bilaterally. IMPRESSION: 1. No active cardiopulmonary disease. No evidence of pneumonia or pulmonary edema. 2. Hyperexpanded lungs suggesting COPD. Electronically Signed   By: Franki Cabot M.D.   On: 01/21/2022 10:24    EKG: Independently reviewed.  Sinus rhythm, no acute ST changes.  Assessment/Plan Principal Problem:   Hypoxia Active Problems:   Aspiration pneumonia ([place])  (please populate well all problems here in Problem List. (For example, if patient is on BP meds at home and you resume or decide to hold them, it is a problem that needs to be her. Same for CAD, COPD, HLD and so on)  Acute hypoxic respiratory failure -Likely secondary to foreign body/solid food aspiration into the laryngeal/pharyngeal area, which much improved after foreign body removal in ED -CT angiogram does show bilateral infiltrates implying aspiration pneumonia probably early stage.  Continue Unasyn for now -Resume pured diet only -Breathing treatment, incentive spirometry -Likely can switch to p.o. antibiotics tomorrow if oxygen level stable and sent back to nursing home.  Discussed with patient's son over the phone who agreed the plan.  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  She does have palliative care follows her at nursing home.  Esophageal cancer -Status post palliative radiation, follows oncology outpatient.  DVT prophylaxis: Lovenox Code Status: DNI Family Communication: Son over the phone Disposition Plan:  Expect less than 2 midnight hospital stay Consults called: None Admission status: Telemetry observation   Lequita Halt MD [facility] Pager [phone]  01/21/2022, 1:53 PM

## 2022-01-21 NOTE — ED Notes (Signed)
Placed on 3L  [place] due to low oxygen sats.

## 2022-01-21 NOTE — ED Notes (Signed)
Patient difficult stick 2nd culture only able to obtain blood top.

## 2022-01-21 NOTE — Progress Notes (Signed)
Pharmacy Antibiotic Note  Sarah Gutierrez is a 76 y.o. female admitted on 01/21/2022 presents from facility after choking episode now with concern for aspiration pneumonia. WBC 10.6 and afebrile. Pharmacy has been consulted for unasyn dosing.  Plan: Unasyn 1.5g q8h  F/u clinical status, microbiology data and length of therapy  Height: '5\' 5"'$  (165.1 cm) Weight: 50.8 kg (112 lb) IBW/kg (Calculated) : 57  Temp (24hrs), Avg:98.6 F (37 C), Min:98.6 F (37 C), Max:98.6 F (37 C)  Recent Labs  Lab 01/21/22 0927  WBC 10.6*  CREATININE 0.92  LATICACIDVEN 2.0*    Estimated Creatinine Clearance: 42.4 mL/min (by C-G formula based on SCr of 0.92 mg/dL).    Allergies  Allergen Reactions   Oyster Extract    Oysters [Shellfish Allergy] Nausea And Vomiting    Antimicrobials this admission: Unasyn 9/9 >  Dose adjustments this admission:  Microbiology results: 9/9 BCx: pending  Thank you for allowing pharmacy to be a part of this patient's care.  Levonne Spiller 01/21/2022 10:49 AM

## 2022-01-21 NOTE — Social Work (Signed)
CSW notified pt is from [facility]. CSW spoke with pt's nurse at the facility who confirmed pt was brought to ED this morning by EMS. Pt is from memory care and needs a lot of assistance at baseline. Per facility, if pt is not admitted and discharged back from the ED, they do not need any additional documentation other than discharge orders. Pt will need to return via EMS. If pt is admitted, facility will need to be updated. TOC will continue to follow for DC needs.

## 2022-01-22 DIAGNOSIS — R0902 Hypoxemia: Secondary | ICD-10-CM | POA: Diagnosis present

## 2022-01-22 DIAGNOSIS — C159 Malignant neoplasm of esophagus, unspecified: Secondary | ICD-10-CM | POA: Diagnosis present

## 2022-01-22 DIAGNOSIS — Z515 Encounter for palliative care: Secondary | ICD-10-CM | POA: Diagnosis not present

## 2022-01-22 DIAGNOSIS — Z681 Body mass index (BMI) 19 or less, adult: Secondary | ICD-10-CM | POA: Diagnosis not present

## 2022-01-22 DIAGNOSIS — Z808 Family history of malignant neoplasm of other organs or systems: Secondary | ICD-10-CM | POA: Diagnosis not present

## 2022-01-22 DIAGNOSIS — Z923 Personal history of irradiation: Secondary | ICD-10-CM | POA: Diagnosis not present

## 2022-01-22 DIAGNOSIS — J9601 Acute respiratory failure with hypoxia: Secondary | ICD-10-CM | POA: Diagnosis present

## 2022-01-22 DIAGNOSIS — F0390 Unspecified dementia without behavioral disturbance: Secondary | ICD-10-CM | POA: Diagnosis present

## 2022-01-22 DIAGNOSIS — Z87891 Personal history of nicotine dependence: Secondary | ICD-10-CM | POA: Diagnosis not present

## 2022-01-22 DIAGNOSIS — J690 Pneumonitis due to inhalation of food and vomit: Secondary | ICD-10-CM | POA: Diagnosis present

## 2022-01-22 DIAGNOSIS — F04 Amnestic disorder due to known physiological condition: Secondary | ICD-10-CM | POA: Diagnosis present

## 2022-01-22 DIAGNOSIS — K5900 Constipation, unspecified: Secondary | ICD-10-CM | POA: Diagnosis present

## 2022-01-22 DIAGNOSIS — E43 Unspecified severe protein-calorie malnutrition: Secondary | ICD-10-CM | POA: Diagnosis present

## 2022-01-22 DIAGNOSIS — I10 Essential (primary) hypertension: Secondary | ICD-10-CM | POA: Diagnosis present

## 2022-01-22 DIAGNOSIS — Z8249 Family history of ischemic heart disease and other diseases of the circulatory system: Secondary | ICD-10-CM | POA: Diagnosis not present

## 2022-01-22 DIAGNOSIS — Z90710 Acquired absence of both cervix and uterus: Secondary | ICD-10-CM | POA: Diagnosis not present

## 2022-01-22 DIAGNOSIS — Z79899 Other long term (current) drug therapy: Secondary | ICD-10-CM | POA: Diagnosis not present

## 2022-01-22 DIAGNOSIS — Z91013 Allergy to seafood: Secondary | ICD-10-CM | POA: Diagnosis not present

## 2022-01-22 LAB — CBC
HCT: 37.6 % (ref 36.0–46.0)
Hemoglobin: 12.4 g/dL (ref 12.0–15.0)
MCH: 30.6 pg (ref 26.0–34.0)
MCHC: 33 g/dL (ref 30.0–36.0)
MCV: 92.8 fL (ref 80.0–100.0)
Platelets: 255 10*3/uL (ref 150–400)
RBC: 4.05 MIL/uL (ref 3.87–5.11)
RDW: 13.9 % (ref 11.5–15.5)
WBC: 7.5 10*3/uL (ref 4.0–10.5)
nRBC: 0 % (ref 0.0–0.2)

## 2022-01-22 LAB — BASIC METABOLIC PANEL
Anion gap: 11 (ref 5–15)
BUN: 17 mg/dL (ref 8–23)
CO2: 25 mmol/L (ref 22–32)
Calcium: 10.2 mg/dL (ref 8.9–10.3)
Chloride: 105 mmol/L (ref 98–111)
Creatinine, Ser: 0.71 mg/dL (ref 0.44–1.00)
GFR, Estimated: >60 mL/min (ref >60)
Glucose, Bld: 76 mg/dL (ref 70–99)
Potassium: 3.9 mmol/L (ref 3.5–5.1)
Sodium: 141 mmol/L (ref 135–145)

## 2022-01-22 LAB — VITAMIN B12: Vitamin B-12: 1071 pg/mL — ABNORMAL HIGH (ref 180–914)

## 2022-01-22 LAB — MAGNESIUM: Magnesium: 2 mg/dL (ref 1.7–2.4)

## 2022-01-22 MED ORDER — ENSURE ENLIVE PO LIQD
237.0000 mL | Freq: Two times a day (BID) | ORAL | Status: DC
Start: 2022-01-22 — End: 2022-01-23
  Administered 2022-01-22 – 2022-01-23 (×3): 237 mL via ORAL

## 2022-01-22 MED ORDER — FLEET ENEMA 7-19 GM/118ML RE ENEM
1.0000 | ENEMA | Freq: Once | RECTAL | Status: AC
Start: 2022-01-22 — End: 2022-01-22
  Administered 2022-01-22: 1 via RECTAL
  Filled 2022-01-22: qty 1

## 2022-01-22 MED ORDER — SENNOSIDES-DOCUSATE SODIUM 8.6-50 MG PO TABS: 2.0000 | ORAL_TABLET | Freq: Every day | ORAL | Status: DC

## 2022-01-22 MED ORDER — LACTULOSE 10 GM/15ML PO SOLN
30.0000 g | Freq: Every day | ORAL | Status: DC
  Administered 2022-01-22: 30 g via ORAL
  Filled 2022-01-22: qty 45

## 2022-01-22 MED ORDER — THIAMINE MONONITRATE 100 MG PO TABS
100.0000 mg | ORAL_TABLET | Freq: Every day | ORAL | Status: DC
  Administered 2022-01-22 – 2022-01-23 (×2): 100 mg via ORAL
  Filled 2022-01-22 (×2): qty 1

## 2022-01-22 MED ORDER — ENOXAPARIN SODIUM 40 MG/0.4ML IJ SOSY
40.0000 mg | PREFILLED_SYRINGE | INTRAMUSCULAR | Status: DC
  Administered 2022-01-22: 40 mg via SUBCUTANEOUS
  Filled 2022-01-22: qty 0.4

## 2022-01-22 NOTE — Progress Notes (Signed)
Routine EKG done per order. Shows SB-45 BPM, otherwise normal. Placed on chart.  BP 128/50 . Awakens to voice. Only answers "yes," or "no," to questions. She is calm and cooperative. Eyes close quickly after any verbal stimuli and she falls back to sleep. Bed in low position and bed alarm engaged. Call light in reach.

## 2022-01-22 NOTE — Progress Notes (Signed)
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  Patient recently diagnosed this year with esophageal cancer and underwent palliative radiation in March and April of this year.  She has been on pured diet, there is no plan for PEG tube.  The morning of admission patient was eating cyanosis she shock coded and became hypoxic.  Oxygen saturation was 82 on room air.  She was transferred to emergency department by EMS.  ED physician was able to retrieve multiple pieces of sausage from the throat oxygen and improved to 2 L.  CT negative for PE, multifocal nodular changes likely pneumonia versus metastasis.     Assessment & Plan:   Principal Problem:   Hypoxia Active Problems:   Aspiration pneumonia (HCC)  1-Acute hypoxic respiratory failure In the setting of foreign body obstruction, aspiration pneumonia On room air today.  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 Start thiamine supplement.   Severe protein caloric malnutrition: Start ensure  Esophageal cancer: S/P palliative radiation.   Bradycardia:  Hold Aricept.  Check Bmet, Mg level.  Asymptomatic while she sleep. When she is alert her HR in the 60  Constipation, stool ball rectum seen on CT;  Enema ordered.  [Er nurse report she has had now multiples BM after enema.   Estimated body mass index is 16.4 kg/m as calculated from the following:   Height as of this encounter: '5\' 5"'$  (1.651 m).   Weight as of this encounter: 44.7 kg.   DVT prophylaxis: Lovenox Code Status:  partial  Family Communication: Disposition Plan:  Status is: Observation The patient remains OBS appropriate and will d/c before 2 midnights.    Consultants:  None  Procedures:  None  Antimicrobials:    Subjective: She was sleepy when I arrive to to room HR 45, I wake her up and her HR increase to 55--60 She only said Ok.   Objective: Vitals:   01/22/22 0100 01/22/22 0200 01/22/22 0400 01/22/22 0500  BP: (!) 114/45 (!) 112/40 (!) 117/47 (!) 128/50  Pulse:   (!) 44 (!) 48  Resp: '16 17 17 16  '$ Temp:   97.6 F (36.4 C)   TempSrc:   Oral   SpO2:   95% 100%  Weight:      Height:        Intake/Output Summary (Last 24 hours) at 01/22/2022 0711 Last data filed at 01/22/2022 0500 Gross per 24 hour  Intake 900 ml  Output 150 ml  Net 750 ml   Filed Weights   01/21/22 0858 01/22/22 0025  Weight: 50.8 kg 44.7 kg    Examination:  General exam: Appears calm and comfortable  Respiratory system: CTA Cardiovascular system: S1 & S2 heard, RRR.  Gastrointestinal system: Abdomen is nondistended, soft and nontender. No organomegaly or masses felt. Normal bowel sounds heard. Central nervous system: Alert and oriented.  Extremities: Symmetric 5 x 5 power.   Data Reviewed: I have personally reviewed following labs and imaging studies  CBC: Recent Labs  Lab 01/21/22 0927 01/22/22 0555  WBC 10.6* 7.5  NEUTROABS 9.0*  --   HGB 13.3 12.4  HCT 41.0  37.6  MCV 94.9 92.8  PLT 386 696   Basic Metabolic Panel: Recent Labs  Lab 01/21/22 0927  NA 140  K 4.1  CL 105  CO2 25  GLUCOSE 121*  BUN 19  CREATININE 0.92  CALCIUM 10.3   GFR: Estimated Creatinine Clearance: 37.3 mL/min (by C-G formula based on SCr of 0.92 mg/dL). Liver Function Tests: Recent Labs  Lab 01/21/22 0927  AST 18  ALT 11  ALKPHOS 79  BILITOT 0.2*  PROT 7.2  ALBUMIN 3.6   Recent Labs  Lab 01/21/22 0927  LIPASE 31   No results for input(s): "AMMONIA" in the last 168 hours. Coagulation  Profile: No results for input(s): "INR", "PROTIME" in the last 168 hours. Cardiac Enzymes: No results for input(s): "CKTOTAL", "CKMB", "CKMBINDEX", "TROPONINI" in the last 168 hours. BNP (last 3 results) No results for input(s): "PROBNP" in the last 8760 hours. HbA1C: No results for input(s): "HGBA1C" in the last 72 hours. CBG: No results for input(s): "GLUCAP" in the last 168 hours. Lipid Profile: No results for input(s): "CHOL", "HDL", "LDLCALC", "TRIG", "CHOLHDL", "LDLDIRECT" in the last 72 hours. Thyroid Function Tests: No results for input(s): "TSH", "T4TOTAL", "FREET4", "T3FREE", "THYROIDAB" in the last 72 hours. Anemia Panel: No results for input(s): "VITAMINB12", "FOLATE", "FERRITIN", "TIBC", "IRON", "RETICCTPCT" in the last 72 hours. Sepsis Labs: Recent Labs  Lab 01/21/22 0927 01/21/22 1059  LATICACIDVEN 2.0* 1.9    Recent Results (from the past 240 hour(s))  Blood culture (routine x 2)     Status: None (Preliminary result)   Collection Time: 01/21/22  9:40 AM   Specimen: BLOOD  Result Value Ref Range Status   Specimen Description BLOOD LEFT ANTECUBITAL  Final   Special Requests   Final    BOTTLES DRAWN AEROBIC AND ANAEROBIC Blood Culture adequate volume   Culture   Final    NO GROWTH < 24 HOURS Performed at [facility], [address], [place], [place] [address]    Report Status PENDING  Incomplete  Blood culture (routine x 2)     Status: None (Preliminary result)   Collection Time: 01/21/22  9:48 AM   Specimen: BLOOD LEFT FOREARM  Result Value Ref Range Status   Specimen Description BLOOD LEFT FOREARM  Final   Special Requests   Final    BOTTLES DRAWN AEROBIC ONLY Blood Culture results may not be optimal due to an inadequate volume of blood received in culture bottles   Culture   Final    NO GROWTH < 24 HOURS Performed at [facility], [place] [address] [place], [place] [address]    Report Status PENDING  Incomplete         Radiology  Studies: CT Angio Chest PE W and/or Wo Contrast  Result Date: 01/21/2022 CLINICAL DATA:  Abdominal pain nonlocalized also concern for aspiration pneumonia. History of esophageal cancer EXAM: CT ANGIOGRAPHY CHEST CT ABDOMEN AND PELVIS WITH CONTRAST TECHNIQUE: Multidetector CT imaging of the chest was performed using the standard protocol during bolus administration of intravenous contrast. Multiplanar CT image reconstructions and MIPs were obtained to evaluate the vascular anatomy. Multidetector CT imaging of the abdomen and pelvis was performed using the standard protocol during bolus administration of intravenous contrast. RADIATION DOSE REDUCTION: This exam was performed according to the departmental dose-optimization program which includes automated exposure control, adjustment of the mA and/or kV according to patient size and/or use of iterative reconstruction technique. CONTRAST:  69m OMNIPAQUE IOHEXOL 350 MG/ML SOLN COMPARISON:  Multiple priors including outside CTs of the chest abdomen pelvis dated August 03, 2021 FINDINGS: CTA CHEST FINDINGS Cardiovascular: Satisfactory opacification of the pulmonary arteries to the segmental level. No evidence of pulmonary embolism. Aortic atherosclerosis. No thoracic aortic aneurysm. Normal heart size. No pericardial effusion. Mediastinum/Nodes: There is some thickening of the proximal/mid esophagus for instance on image 26/6 which may reflect patient's known esophageal neoplasm. No supraclavicular adenopathy. No discrete thyroid nodule. No pathologically enlarged mediastinal, hilar or axillary lymph nodes. Lungs/Pleura: Biapical pleuroparenchymal scarring. Scarring in the paramedian/paramediastinal scarring in the upper and lower lobes is new from prior and likely reflect sequela of radiation therapy. Patchy nodular ground-glass opacities in the dependent lungs for instance on image 61/7 and 104/7 in the right lower lobe on image 60/7 in the left lower lobe. As well  there are new multifocal bilateral pulmonary nodules. Reference nodules are as follows: -left lower lobe measuring 5 mm on image 94/7 -Right lower lobe pulmonary nodule measures 6 mm on image 59/7. -Right upper lobe pulmonary nodule measures 3 mm pulmonary nodule on image 59/7. Diffuse bronchial wall thickening with mosaic attenuation of the lungs may reflect small airways disease. Musculoskeletal: No aggressive lytic or blastic lesion of bone. No acute osseous abnormality. Review of the MIP images confirms the above findings. CT ABDOMEN and PELVIS FINDINGS Imaging of the abdomen and pelvis is slightly degraded by motion. Hepatobiliary: Ill-defined hypodensity along the falciform ligament in segment IV is similar prior and commonly reflects fatty infiltration or differential perfusion (veins of [place]). No suspicious hepatic lesion. Gallbladder is unremarkable. No biliary ductal dilation. Pancreas: No pancreatic ductal dilation or evidence of acute inflammation. Spleen: No splenomegaly or focal splenic lesion. Adrenals/Urinary Tract: Similar bilateral adrenal thickening without discrete nodularity, favor hyperplasia. No hydronephrosis. Kidneys demonstrate symmetric enhancement and excretion of contrast material. Urinary bladder is unremarkable for degree of distension. Stomach/Bowel: No radiopaque enteric contrast material was administered. Large rectal stool ball with rectal wall thickening and perirectal stranding consistent with stercoral colitis. 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 Stomach is nondistended limiting evaluation. No pathologic dilation of small or large bowel. Colonic diverticulosis without findings of acute diverticulitis. Vascular/Lymphatic: Aortic atherosclerosis. No pathologically enlarged abdominal or pelvic lymph nodes. Reproductive: Similar lobular enhancement of the uterus consistent with uterine leiomyomas. No suspicious adnexal mass. Other:  No significant abdominopelvic free fluid. No discrete peritoneal or omental nodularity. Musculoskeletal: No aggressive lytic or blastic lesion of bone. Mild multilevel degenerative changes spine. Review of the MIP images confirms the above findings. IMPRESSION: 1. No pulmonary embolus. 2. New bilateral multifocal pulmonary nodules and ground-glass opacities which may be infectious or inflammatory, some of which are located in the dependent lungs and may reflect a component of aspiration. However, metastatic disease involvement is a pertinent differential consideration not excluded on this examination. Suggest short-term interval follow-up with dedicated chest CT. 3. Diffuse bronchial wall thickening with mosaic attenuation of the lungs suggestive of small airways disease. 4. New patchy ground-glass opacities in the paramedian/paramediastinal bilateral upper and lower lobes likely reflecting postradiation change. 5. Wall thickening of the proximal/mid esophagus may reflect patient's known esophageal neoplasm. 6. Large stool ball in the rectum with findings that can be seen with stercoral colitis. 7.  Aortic Atherosclerosis (ICD10-I70.0). Electronically Signed   By: Dahlia Bailiff M.D.   On: 01/21/2022 13:30   CT ABDOMEN PELVIS W CONTRAST  Result Date: 01/21/2022 CLINICAL DATA:  Abdominal pain nonlocalized also concern for aspiration pneumonia. History  of esophageal cancer EXAM: CT ANGIOGRAPHY CHEST CT ABDOMEN AND PELVIS WITH CONTRAST TECHNIQUE: Multidetector CT imaging of the chest was performed using the standard protocol during bolus administration of intravenous contrast. Multiplanar CT image reconstructions and MIPs were obtained to evaluate the vascular anatomy. Multidetector CT imaging of the abdomen and pelvis was performed using the standard protocol during bolus administration of intravenous contrast. RADIATION DOSE REDUCTION: This exam was performed according to the departmental dose-optimization program  which includes automated exposure control, adjustment of the mA and/or kV according to patient size and/or use of iterative reconstruction technique. CONTRAST:  18m OMNIPAQUE IOHEXOL 350 MG/ML SOLN COMPARISON:  Multiple priors including outside CTs of the chest abdomen pelvis dated August 03, 2021 FINDINGS: CTA CHEST FINDINGS Cardiovascular: Satisfactory opacification of the pulmonary arteries to the segmental level. No evidence of pulmonary embolism. Aortic atherosclerosis. No thoracic aortic aneurysm. Normal heart size. No pericardial effusion. Mediastinum/Nodes: There is some thickening of the proximal/mid esophagus for instance on image 26/6 which may reflect patient's known esophageal neoplasm. No supraclavicular adenopathy. No discrete thyroid nodule. No pathologically enlarged mediastinal, hilar or axillary lymph nodes. Lungs/Pleura: Biapical pleuroparenchymal scarring. Scarring in the paramedian/paramediastinal scarring in the upper and lower lobes is new from prior and likely reflect sequela of radiation therapy. Patchy nodular ground-glass opacities in the dependent lungs for instance on image 61/7 and 104/7 in the right lower lobe on image 60/7 in the left lower lobe. As well there are new multifocal bilateral pulmonary nodules. Reference nodules are as follows: -left lower lobe measuring 5 mm on image 94/7 -Right lower lobe pulmonary nodule measures 6 mm on image 59/7. -Right upper lobe pulmonary nodule measures 3 mm pulmonary nodule on image 59/7. Diffuse bronchial wall thickening with mosaic attenuation of the lungs may reflect small airways disease. Musculoskeletal: No aggressive lytic or blastic lesion of bone. No acute osseous abnormality. Review of the MIP images confirms the above findings. CT ABDOMEN and PELVIS FINDINGS Imaging of the abdomen and pelvis is slightly degraded by motion. Hepatobiliary: Ill-defined hypodensity along the falciform ligament in segment IV is similar prior and commonly  reflects fatty infiltration or differential perfusion (veins of [place]appey). No suspicious hepatic lesion. Gallbladder is unremarkable. No biliary ductal dilation. Pancreas: No pancreatic ductal dilation or evidence of acute inflammation. Spleen: No splenomegaly or focal splenic lesion. Adrenals/Urinary Tract: Similar bilateral adrenal thickening without discrete nodularity, favor hyperplasia. No hydronephrosis. Kidneys demonstrate symmetric enhancement and excretion of contrast material. Urinary bladder is unremarkable for degree of distension. Stomach/Bowel: No radiopaque enteric contrast material was administered. Large rectal stool ball with rectal wall thickening and perirectal stranding consistent with stercoral colitis. 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 Stomach is nondistended limiting evaluation. No pathologic dilation of small or large bowel. Colonic diverticulosis without findings of acute diverticulitis. Vascular/Lymphatic: Aortic atherosclerosis. No pathologically enlarged abdominal or pelvic lymph nodes. Reproductive: Similar lobular enhancement of the uterus consistent with uterine leiomyomas. No suspicious adnexal mass. Other: No significant abdominopelvic free fluid. No discrete peritoneal or omental nodularity. Musculoskeletal: No aggressive lytic or blastic lesion of bone. Mild multilevel degenerative changes spine. Review of the MIP images confirms the above findings. IMPRESSION: 1. No pulmonary embolus. 2. New bilateral multifocal pulmonary nodules and ground-glass opacities which may be infectious or inflammatory, some of which are located in the dependent lungs and may reflect a component of aspiration. However, metastatic disease involvement is a pertinent differential consideration not excluded on this examination. Suggest short-term interval  follow-up with dedicated chest CT. 3. Diffuse bronchial wall thickening with mosaic attenuation of the  lungs suggestive of small airways disease. 4. New patchy ground-glass opacities in the paramedian/paramediastinal bilateral upper and lower lobes likely reflecting postradiation change. 5. Wall thickening of the proximal/mid esophagus may reflect patient's known esophageal neoplasm. 6. Large stool ball in the rectum with findings that can be seen with stercoral colitis. 7.  Aortic Atherosclerosis (ICD10-I70.0). Electronically Signed   By: Dahlia Bailiff M.D.   On: 01/21/2022 13:30   DG Neck Soft Tissue  Result Date: 01/21/2022 CLINICAL DATA:  Hypoxia Aspiration EXAM: NECK SOFT TISSUES - 1+ VIEW COMPARISON:  None available FINDINGS: The prevertebral soft tissues are normal in thickness. No radiopaque foreign body. Degenerative changes seen throughout the cervical spine, greatest at C5-C6. Evaluation of epiglottis limited due to overlying external artifact. It does not appear significantly thickened. IMPRESSION: No significant abnormality of the neck soft tissues. If there is continued clinical concern for neck soft tissue pathology, further evaluation with contrast enhanced soft tissue neck CT should be performed. Electronically Signed   By: Miachel Roux M.D.   On: 01/21/2022 10:26   DG Chest 2 View  Result Date: 01/21/2022 CLINICAL DATA:  Hypoxia, aspiration. EXAM: CHEST - 2 VIEW COMPARISON:  Chest x-ray dated 07/10/2021 FINDINGS: Heart size and mediastinal contours are within normal limits. Lungs appear hyperexpanded. Lungs are clear. No pleural effusion or pneumothorax is seen. No acute-appearing osseous abnormality. Chronic appearing rib fractures bilaterally. IMPRESSION: 1. No active cardiopulmonary disease. No evidence of pneumonia or pulmonary edema. 2. Hyperexpanded lungs suggesting COPD. Electronically Signed   By: Franki Cabot M.D.   On: 01/21/2022 10:24        Scheduled Meds:  donepezil  10 mg Oral QHS   enoxaparin (LOVENOX) injection  30 mg Subcutaneous Q24H   lisinopril  2.5 mg Oral Daily    pantoprazole  40 mg Oral Daily   polyethylene glycol  17 g Oral Daily   pravastatin  10 mg Oral QPM   QUEtiapine  25 mg Oral QHS   senna-docusate  2 tablet Oral QHS   sertraline  50 mg Oral Daily   thiamine  100 mg Oral Daily   Continuous Infusions:  ampicillin-sulbactam (UNASYN) IV 1.5 g (01/22/22 0257)     LOS: 0 days    Time spent: 35 minutes    Geraldine Tesar A Artina Minella, MD [facility]   If 7PM-7AM, please contact night-coverage [email or web address]  01/22/2022, 7:11 AM

## 2022-01-23 DIAGNOSIS — R0902 Hypoxemia: Secondary | ICD-10-CM | POA: Diagnosis not present

## 2022-01-23 MED ORDER — AMOXICILLIN-POT CLAVULANATE 600-42.9 MG/5ML PO SUSR: 600.0000 mg | Freq: Two times a day (BID) | ORAL | 0 refills | Status: AC

## 2022-01-23 MED ORDER — SODIUM CHLORIDE 0.9 % IV SOLN
INTRAVENOUS | Status: DC
  Administered 2022-01-23: 100 mL/h via INTRAVENOUS

## 2022-01-23 MED ORDER — QUETIAPINE FUMARATE 25 MG PO TABS: 25.0000 mg | ORAL_TABLET | Freq: Every day | ORAL | 0 refills | Status: AC

## 2022-01-23 NOTE — Discharge Summary (Addendum)
Physician Discharge Summary   Patient: Sarah Gutierrez MRN: [id number] DOB: 08-01-1945  Admit date:     01/21/2022  Discharge date: 01/23/22  Discharge Physician: Elmarie Shiley   PCP: [facility]   Recommendations at discharge:    Aricept on hold due to Bradycardia.  Dysphagia 1 diet  Continue Hospice care at ALF  Discharge Diagnoses: Principal Problem:   Hypoxia Active Problems:   Aspiration pneumonia ([place])  Resolved Problems:   * No resolved hospital problems. 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  Patient recently diagnosed this year with esophageal cancer and underwent palliative radiation in March and April of this year.  She has been on pured diet, there is no plan for PEG tube.  The morning of admission patient was eating cyanosis she shock coded and became hypoxic.  Oxygen saturation was 82 on room air.  She was transferred to emergency department by EMS.  ED physician was able to retrieve multiple pieces of sausage from the throat oxygen and improved to 2 L.  CT negative for PE, multifocal nodular changes likely pneumonia versus metastasis.    Assessment and Plan: 1-Acute hypoxic respiratory failure In the setting of foreign body obstruction, aspiration pneumonia On room air today.  Treated  with IV Unasyn.  Discharge on Augmentin   Aspiration pneumonia: Treated  with Unasyn Dysphagia 1 diet/  Discharge on Augmentin for 5 days.  CT chest: . New bilateral multifocal pulmonary nodules and ground-glass opacities which may be infectious or inflammatory, some of which are located in the dependent lungs and may reflect a component of aspiration. However, metastatic disease involvement is a  pertinent differential consideration not excluded on this examination. Suggest short-term interval follow-up with dedicated chest CT. Patient under Hospice care.   Korsakoff dementia: B12 elevated. Continue with  thiamine supplement.    Severe protein caloric malnutrition: Continue with ensure   Esophageal cancer: S/P palliative radiation.    Bradycardia:  Hold Aricept.  Potassium, Mg normal.  Asymptomatic while she sleep. When she is alert her HR in the 60   Constipation, stool ball rectum seen on CT;  Enema ordered.   nurse report she has had now multiples BM after enema.   Continue with miralax.        Consultants: None Procedures performed: None Disposition: Assisted living Diet recommendation: Dysphagia diet  Discharge Diet Orders (From admission, onward)     Start     Ordered   01/23/22 0000  Diet - low sodium heart healthy        01/23/22 0910           Regular diet DISCHARGE MEDICATION: Allergies as of 01/23/2022       Reactions   Oyster Extract    Oysters [shellfish Allergy] Nausea And Vomiting        Medication List     STOP taking these medications    donepezil 10 MG tablet Commonly known as: ARICEPT   lisinopril 2.5 MG tablet Commonly known as: ZESTRIL   loperamide 2 MG capsule Commonly known as: IMODIUM       TAKE these medications    acetaminophen 325 MG tablet Commonly known as: TYLENOL Take 650 mg by mouth every 6 (six) hours as needed for mild pain or moderate pain.  amoxicillin-clavulanate 600-42.9 MG/5ML suspension Commonly known as: Augmentin ES-600 Take 5 mLs (600 mg total) by mouth 2 (two) times daily for 5 days.   bisacodyl 10 MG suppository Commonly known as: DULCOLAX Place 10 mg rectally daily as needed for moderate constipation.   LORazepam 0.5 MG tablet Commonly known as: ATIVAN Take 0.5 mg by mouth 2 (two) times daily as needed (agitation).   mineral oil liquid Take 15 mLs by mouth at bedtime as  needed for mild constipation.   NON FORMULARY Take 2.5 mLs by mouth daily. Tooth and gum tonic use 1/2 teaspoon as directed   omeprazole 40 MG capsule Commonly known as: PRILOSEC Take 40 mg by mouth daily.   ondansetron 4 MG tablet Commonly known as: ZOFRAN Take 4 mg by mouth every 6 (six) hours as needed for nausea or vomiting.   One-A-Day Womens 50+ Tabs Take 1 tablet by mouth daily.   pantoprazole 40 MG tablet Commonly known as: Protonix Take 1 tablet (40 mg total) by mouth daily.   polyethylene glycol 17 g packet Commonly known as: MIRALAX / GLYCOLAX Take 17 g by mouth daily.   pravastatin 10 MG tablet Commonly known as: PRAVACHOL Take 10 mg by mouth every evening.   QUEtiapine 25 MG tablet Commonly known as: SEROQUEL Take 1 tablet (25 mg total) by mouth at bedtime. What changed: when to take this   senna-docusate 8.6-50 MG tablet Commonly known as: Senokot-S Take 1 tablet by mouth daily. What changed:  how much to take when to take this   sertraline 50 MG tablet Commonly known as: ZOLOFT Take 50 mg by mouth daily.   thiamine 100 MG tablet Commonly known as: VITAMIN B1 Take 100 mg by mouth daily.   traZODone 50 MG tablet Commonly known as: DESYREL Take 1 tablet (50 mg total) by mouth 3 (three) times daily as needed (agitation). What changed:  when to take this reasons to take this   triamcinolone cream 0.1 % Commonly known as: KENALOG Apply 1 application topically 3 (three) times daily.        Discharge Exam: Filed Weights   01/21/22 0858 01/22/22 0025  Weight: 50.8 kg 44.7 kg   General; sleepy, wake up to voice, says yes to questions. Denies pain.   Condition at discharge: stable  The results of significant diagnostics from this hospitalization (including imaging, microbiology, ancillary and laboratory) are listed below for reference.   Imaging Studies: CT Angio Chest PE W and/or Wo Contrast  Result Date: 01/21/2022 CLINICAL DATA:   Abdominal pain nonlocalized also concern for aspiration pneumonia. History of esophageal cancer EXAM: CT ANGIOGRAPHY CHEST CT ABDOMEN AND PELVIS WITH CONTRAST TECHNIQUE: Multidetector CT imaging of the chest was performed using the standard protocol during bolus administration of intravenous contrast. Multiplanar CT image reconstructions and MIPs were obtained to evaluate the vascular anatomy. Multidetector CT imaging of the abdomen and pelvis was performed using the standard protocol during bolus administration of intravenous contrast. RADIATION DOSE REDUCTION: This exam was performed according to the departmental dose-optimization program which includes automated exposure control, adjustment of the mA and/or kV according to patient size and/or use of iterative reconstruction technique. CONTRAST:  40m OMNIPAQUE IOHEXOL 350 MG/ML SOLN COMPARISON:  Multiple priors including outside CTs of the chest abdomen pelvis dated August 03, 2021 FINDINGS: CTA CHEST FINDINGS Cardiovascular: Satisfactory opacification of the pulmonary arteries to the segmental level. No evidence of pulmonary embolism. Aortic atherosclerosis. No thoracic aortic aneurysm. Normal heart size. No pericardial effusion. Mediastinum/Nodes: There  is some thickening of the proximal/mid esophagus for instance on image 26/6 which may reflect patient's known esophageal neoplasm. No supraclavicular adenopathy. No discrete thyroid nodule. No pathologically enlarged mediastinal, hilar or axillary lymph nodes. Lungs/Pleura: Biapical pleuroparenchymal scarring. Scarring in the paramedian/paramediastinal scarring in the upper and lower lobes is new from prior and likely reflect sequela of radiation therapy. Patchy nodular ground-glass opacities in the dependent lungs for instance on image 61/7 and 104/7 in the right lower lobe on image 60/7 in the left lower lobe. As well there are new multifocal bilateral pulmonary nodules. Reference nodules are as follows: -left  lower lobe measuring 5 mm on image 94/7 -Right lower lobe pulmonary nodule measures 6 mm on image 59/7. -Right upper lobe pulmonary nodule measures 3 mm pulmonary nodule on image 59/7. Diffuse bronchial wall thickening with mosaic attenuation of the lungs may reflect small airways disease. Musculoskeletal: No aggressive lytic or blastic lesion of bone. No acute osseous abnormality. Review of the MIP images confirms the above findings. CT ABDOMEN and PELVIS FINDINGS Imaging of the abdomen and pelvis is slightly degraded by motion. Hepatobiliary: Ill-defined hypodensity along the falciform ligament in segment IV is similar prior and commonly reflects fatty infiltration or differential perfusion (veins of [place]). No suspicious hepatic lesion. Gallbladder is unremarkable. No biliary ductal dilation. Pancreas: No pancreatic ductal dilation or evidence of acute inflammation. Spleen: No splenomegaly or focal splenic lesion. Adrenals/Urinary Tract: Similar bilateral adrenal thickening without discrete nodularity, favor hyperplasia. No hydronephrosis. Kidneys demonstrate symmetric enhancement and excretion of contrast material. Urinary bladder is unremarkable for degree of distension. Stomach/Bowel: No radiopaque enteric contrast material was administered. Large rectal stool ball with rectal wall thickening and perirectal stranding consistent with stercoral colitis. 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 Stomach is nondistended limiting evaluation. No pathologic dilation of small or large bowel. Colonic diverticulosis without findings of acute diverticulitis. Vascular/Lymphatic: Aortic atherosclerosis. No pathologically enlarged abdominal or pelvic lymph nodes. Reproductive: Similar lobular enhancement of the uterus consistent with uterine leiomyomas. No suspicious adnexal mass. Other: No significant abdominopelvic free fluid. No discrete peritoneal or omental nodularity.  Musculoskeletal: No aggressive lytic or blastic lesion of bone. Mild multilevel degenerative changes spine. Review of the MIP images confirms the above findings. IMPRESSION: 1. No pulmonary embolus. 2. New bilateral multifocal pulmonary nodules and ground-glass opacities which may be infectious or inflammatory, some of which are located in the dependent lungs and may reflect a component of aspiration. However, metastatic disease involvement is a pertinent differential consideration not excluded on this examination. Suggest short-term interval follow-up with dedicated chest CT. 3. Diffuse bronchial wall thickening with mosaic attenuation of the lungs suggestive of small airways disease. 4. New patchy ground-glass opacities in the paramedian/paramediastinal bilateral upper and lower lobes likely reflecting postradiation change. 5. Wall thickening of the proximal/mid esophagus may reflect patient's known esophageal neoplasm. 6. Large stool ball in the rectum with findings that can be seen with stercoral colitis. 7.  Aortic Atherosclerosis (ICD10-I70.0). Electronically Signed   By: Dahlia Bailiff M.D.   On: 01/21/2022 13:30   CT ABDOMEN PELVIS W CONTRAST  Result Date: 01/21/2022 CLINICAL DATA:  Abdominal pain nonlocalized also concern for aspiration pneumonia. History of esophageal cancer EXAM: CT ANGIOGRAPHY CHEST CT ABDOMEN AND PELVIS WITH CONTRAST TECHNIQUE: Multidetector CT imaging of the chest was performed using the standard protocol during bolus administration of intravenous contrast. Multiplanar CT image reconstructions and MIPs were obtained to evaluate the vascular anatomy. Multidetector CT imaging  of the abdomen and pelvis was performed using the standard protocol during bolus administration of intravenous contrast. RADIATION DOSE REDUCTION: This exam was performed according to the departmental dose-optimization program which includes automated exposure control, adjustment of the mA and/or kV according to  patient size and/or use of iterative reconstruction technique. CONTRAST:  32m OMNIPAQUE IOHEXOL 350 MG/ML SOLN COMPARISON:  Multiple priors including outside CTs of the chest abdomen pelvis dated August 03, 2021 FINDINGS: CTA CHEST FINDINGS Cardiovascular: Satisfactory opacification of the pulmonary arteries to the segmental level. No evidence of pulmonary embolism. Aortic atherosclerosis. No thoracic aortic aneurysm. Normal heart size. No pericardial effusion. Mediastinum/Nodes: There is some thickening of the proximal/mid esophagus for instance on image 26/6 which may reflect patient's known esophageal neoplasm. No supraclavicular adenopathy. No discrete thyroid nodule. No pathologically enlarged mediastinal, hilar or axillary lymph nodes. Lungs/Pleura: Biapical pleuroparenchymal scarring. Scarring in the paramedian/paramediastinal scarring in the upper and lower lobes is new from prior and likely reflect sequela of radiation therapy. Patchy nodular ground-glass opacities in the dependent lungs for instance on image 61/7 and 104/7 in the right lower lobe on image 60/7 in the left lower lobe. As well there are new multifocal bilateral pulmonary nodules. Reference nodules are as follows: -left lower lobe measuring 5 mm on image 94/7 -Right lower lobe pulmonary nodule measures 6 mm on image 59/7. -Right upper lobe pulmonary nodule measures 3 mm pulmonary nodule on image 59/7. Diffuse bronchial wall thickening with mosaic attenuation of the lungs may reflect small airways disease. Musculoskeletal: No aggressive lytic or blastic lesion of bone. No acute osseous abnormality. Review of the MIP images confirms the above findings. CT ABDOMEN and PELVIS FINDINGS Imaging of the abdomen and pelvis is slightly degraded by motion. Hepatobiliary: Ill-defined hypodensity along the falciform ligament in segment IV is similar prior and commonly reflects fatty infiltration or differential perfusion (veins of [place]). No suspicious  hepatic lesion. Gallbladder is unremarkable. No biliary ductal dilation. Pancreas: No pancreatic ductal dilation or evidence of acute inflammation. Spleen: No splenomegaly or focal splenic lesion. Adrenals/Urinary Tract: Similar bilateral adrenal thickening without discrete nodularity, favor hyperplasia. No hydronephrosis. Kidneys demonstrate symmetric enhancement and excretion of contrast material. Urinary bladder is unremarkable for degree of distension. Stomach/Bowel: No radiopaque enteric contrast material was administered. Large rectal stool ball with rectal wall thickening and perirectal stranding consistent with stercoral colitis. 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 Stomach is nondistended limiting evaluation. No pathologic dilation of small or large bowel. Colonic diverticulosis without findings of acute diverticulitis. Vascular/Lymphatic: Aortic atherosclerosis. No pathologically enlarged abdominal or pelvic lymph nodes. Reproductive: Similar lobular enhancement of the uterus consistent with uterine leiomyomas. No suspicious adnexal mass. Other: No significant abdominopelvic free fluid. No discrete peritoneal or omental nodularity. Musculoskeletal: No aggressive lytic or blastic lesion of bone. Mild multilevel degenerative changes spine. Review of the MIP images confirms the above findings. IMPRESSION: 1. No pulmonary embolus. 2. New bilateral multifocal pulmonary nodules and ground-glass opacities which may be infectious or inflammatory, some of which are located in the dependent lungs and may reflect a component of aspiration. However, metastatic disease involvement is a pertinent differential consideration not excluded on this examination. Suggest short-term interval follow-up with dedicated chest CT. 3. Diffuse bronchial wall thickening with mosaic attenuation of the lungs suggestive of small airways disease. 4. New patchy ground-glass opacities in the  paramedian/paramediastinal bilateral upper and lower lobes likely reflecting postradiation change. 5. Wall thickening of the proximal/mid esophagus may reflect  patient's known esophageal neoplasm. 6. Large stool ball in the rectum with findings that can be seen with stercoral colitis. 7.  Aortic Atherosclerosis (ICD10-I70.0). Electronically Signed   By: Dahlia Bailiff M.D.   On: 01/21/2022 13:30   DG Neck Soft Tissue  Result Date: 01/21/2022 CLINICAL DATA:  Hypoxia Aspiration EXAM: NECK SOFT TISSUES - 1+ VIEW COMPARISON:  None available FINDINGS: The prevertebral soft tissues are normal in thickness. No radiopaque foreign body. Degenerative changes seen throughout the cervical spine, greatest at C5-C6. Evaluation of epiglottis limited due to overlying external artifact. It does not appear significantly thickened. IMPRESSION: No significant abnormality of the neck soft tissues. If there is continued clinical concern for neck soft tissue pathology, further evaluation with contrast enhanced soft tissue neck CT should be performed. Electronically Signed   By: Miachel Roux M.D.   On: 01/21/2022 10:26   DG Chest 2 View  Result Date: 01/21/2022 CLINICAL DATA:  Hypoxia, aspiration. EXAM: CHEST - 2 VIEW COMPARISON:  Chest x-ray dated 07/10/2021 FINDINGS: Heart size and mediastinal contours are within normal limits. Lungs appear hyperexpanded. Lungs are clear. No pleural effusion or pneumothorax is seen. No acute-appearing osseous abnormality. Chronic appearing rib fractures bilaterally. IMPRESSION: 1. No active cardiopulmonary disease. No evidence of pneumonia or pulmonary edema. 2. Hyperexpanded lungs suggesting COPD. Electronically Signed   By: Franki Cabot M.D.   On: 01/21/2022 10:24    Microbiology: Results for orders placed or performed during the hospital encounter of 01/21/22  Blood culture (routine x 2)     Status: None (Preliminary result)   Collection Time: 01/21/22  9:40 AM   Specimen: BLOOD  Result  Value Ref Range Status   Specimen Description BLOOD LEFT ANTECUBITAL  Final   Special Requests   Final    BOTTLES DRAWN AEROBIC AND ANAEROBIC Blood Culture adequate volume   Culture   Final    NO GROWTH < 24 HOURS Performed at [facility], [place] [address] [place], [place] [address]    Report Status PENDING  Incomplete  Blood culture (routine x 2)     Status: None (Preliminary result)   Collection Time: 01/21/22  9:48 AM   Specimen: BLOOD LEFT FOREARM  Result Value Ref Range Status   Specimen Description BLOOD LEFT FOREARM  Final   Special Requests   Final    BOTTLES DRAWN AEROBIC ONLY Blood Culture results may not be optimal due to an inadequate volume of blood received in culture bottles   Culture   Final    NO GROWTH < 24 HOURS Performed at [facility], [place] [address] [place],  [address]    Report Status PENDING  Incomplete    Labs: CBC: Recent Labs  Lab 01/21/22 0927 01/22/22 0555  WBC 10.6* 7.5  NEUTROABS 9.0*  --   HGB 13.3 12.4  HCT 41.0 37.6  MCV 94.9 92.8  PLT 386 654   Basic Metabolic Panel: Recent Labs  Lab 01/21/22 0927 01/22/22 0749  NA 140 141  K 4.1 3.9  CL 105 105  CO2 25 25  GLUCOSE 121* 76  BUN 19 17  CREATININE 0.92 0.71  CALCIUM 10.3 10.2  MG  --  2.0   Liver Function Tests: Recent Labs  Lab 01/21/22 0927  AST 18  ALT 11  ALKPHOS 79  BILITOT 0.2*  PROT 7.2  ALBUMIN 3.6   CBG: No results for input(s): "GLUCAP" in the last 168 hours.  Discharge time spent: greater than 30  minutes.  Signed: Elmarie Shiley, MD [facility] 01/23/2022

## 2022-01-23 NOTE — Plan of Care (Signed)

## 2022-01-23 NOTE — Progress Notes (Signed)
[facility] services transported patient back to SNF, discharge packet sent [facility] staff.   Tenecia Ignasiak, Tivis Ringer, RN

## 2022-01-23 NOTE — TOC Transition Note (Signed)
Transition of Care [facility]) - CM/SW Discharge Note   Patient Details  Name: Sarah Gutierrez MRN: [id number] Date of Birth: [birth year]  [facility] of Care [facility]) CM/SW Contact:  Bethann Berkshire, [place] Phone Number: 01/23/2022, 1:04 PM   Clinical Narrative:     [place] and confirmed pt can return today. They notified CSW that pt is active with OP Hospice with [facility]. RNCM notified [facility] [place], FL2, and SLP eval faxed to [phone]  Patient will DC to: [facility] Anticipated DC date: 01/23/22 Family notified:[place],Dr. Jenny Reichmann (Brother)  Transport by: Corey Harold   Per MD patient ready for DC to [facility]. RN, patient, patient's family, and facility notified of DC. Discharge Summary and FL2 sent to facility. RN to call report prior to discharge ( [phone]). DC packet on chart. Ambulance transport requested for patient.   CSW will sign off for now as social work intervention is no longer needed. Please consult [place] again if new needs arise.   Final next level of care: Memory Care Barriers to Discharge: No Barriers Identified   Patient Goals and CMS Choice        Discharge Placement              Patient chooses bed at:  [facility]) Patient to be transferred to facility by: [place] Name of family member notified: [place],Dr. Jenny Reichmann (Brother)   [phone] (Mobile) Patient and family notified of of transfer: 01/23/22  Discharge Plan and Services                                     Social Determinants of Health (SDOH) Interventions Housing Interventions: Intervention Not Indicated   Readmission Risk Interventions     No data to display

## 2022-01-23 NOTE — NC FL2 (Addendum)
[place] LEVEL OF CARE SCREENING TOOL     IDENTIFICATION  Patient Name: Sarah Gutierrez Birthdate: Feb 05, 1946 Sex: female Admission Date (Current Location): 01/21/2022  [facility] and [place] Number:  Herbalist and Address:  [facility] [place], [place] [address], [place], [place] [address]      Provider Number: [id number]  Attending Physician Name and Address:  Elmarie Shiley, MD  Relative Name and Phone Number:  Morrow,Dr. 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   Protein-calorie malnutrition, severe 05/29/2018   Pressure injury of skin 05/29/2018   AKI (acute kidney injury) ([place])    Acute metabolic encephalopathy 12/87/8676   Dehydration 05/20/2018   Hypercalcemia 05/20/2018   Hypernatremia 05/20/2018   Lactic acid acidosis 05/20/2018   Elevated troponin 05/20/2018   Hypertension    Closed displaced fracture of middle phalanx of left ring finger 12/01/2015    Orientation RESPIRATION BLADDER Height & Weight     Self  Normal Incontinent Weight: 98 lb 8.7 oz (44.7 kg) Height:  '5\' 5"'$  (165.1 cm)  BEHAVIORAL SYMPTOMS/MOOD NEUROLOGICAL BOWEL NUTRITION STATUS      Incontinent Diet (pureed food, thin liquids, no salt added)  AMBULATORY STATUS COMMUNICATION OF NEEDS Skin   Extensive Assist Verbally Normal                       Personal Care Assistance Level of Assistance  Bathing, Feeding, Dressing Bathing  Assistance: Maximum assistance Feeding assistance: Limited assistance Dressing Assistance: Maximum assistance Total Care Assistance: Maximum assistance   Functional Limitations Info  Sight, Hearing, Speech Sight Info: Adequate Hearing Info: Adequate Speech Info: Adequate    SPECIAL CARE FACTORS FREQUENCY                       Contractures Contractures Info: Not present    Additional Factors Info  Code Status, Allergies Code Status Info: Partial Allergies Info: Oyster Extract, Oysters (shellfish Allergy)           Current Medications (01/23/2022):  This is the current hospital active medication list Current Facility-Administered Medications  Medication Dose Route Frequency Provider Last Rate Last Admin   0.9 %  sodium chloride infusion   Intravenous Continuous Regalado, Belkys A, MD 100 mL/hr at 01/23/22 0927 100 mL/hr at 01/23/22 0927   acetaminophen (TYLENOL) tablet 650 mg  650 mg Oral Q6H PRN Wynetta Fines T, MD   650 mg at 01/23/22 0033   ampicillin-sulbactam (UNASYN) 1.5 g in sodium chloride 0.9 % 100 mL IVPB  1.5 g Intravenous Q8H Levonne Spiller, RPH 200 mL/hr at 01/23/22 0438 1.5 g at 01/23/22 0438   bisacodyl (DULCOLAX) suppository 10 mg  10 mg Rectal PRN Lequita Halt, MD       enoxaparin (LOVENOX) injection 40 mg  40 mg Subcutaneous Q24H Regalado, Belkys A, MD   40 mg at 01/22/22 2044   feeding supplement (ENSURE ENLIVE / ENSURE  PLUS) liquid 237 mL  237 mL Oral BID BM Regalado, Belkys A, MD   237 mL at 01/23/22 0926   ipratropium-albuterol (DUONEB) 0.5-2.5 (3) MG/3ML nebulizer solution 3 mL  3 mL Nebulization Q6H PRN Wynetta Fines T, MD       OLANZapine [facility]) injection 2.5 mg  2.5 mg Intramuscular Q6H PRN Wynetta Fines T, MD       ondansetron [facility]) tablet 4 mg  4 mg Oral Q6H PRN Wynetta Fines T, MD       pantoprazole (PROTONIX) EC tablet 40 mg  40 mg Oral Daily Wynetta Fines T, MD   40 mg at 01/23/22 0925   polyethylene glycol (MIRALAX / GLYCOLAX) packet 17 g  17 g  Oral Daily Wynetta Fines T, MD   17 g at 01/23/22 0926   pravastatin (PRAVACHOL) tablet 10 mg  10 mg Oral QPM Wynetta Fines T, MD   10 mg at 01/22/22 1710   QUEtiapine (SEROQUEL) tablet 25 mg  25 mg Oral QHS Wynetta Fines T, MD   25 mg at 01/22/22 2045   senna-docusate (Senokot-S) tablet 2 tablet  2 tablet Oral QHS Regalado, Belkys A, MD       sertraline (ZOLOFT) tablet 50 mg  50 mg Oral Daily Wynetta Fines T, MD   50 mg at 01/23/22 6967   thiamine (VITAMIN B1) tablet 100 mg  100 mg Oral Daily Regalado, Belkys A, MD   100 mg at 01/23/22 0925   traZODone (DESYREL) tablet 50 mg  50 mg Oral TID PRN Lequita Halt, MD   50 mg at 01/23/22 0033     Discharge Medications: Please see discharge summary for a list of discharge medications.  Relevant Imaging Results:  Relevant Lab Results:   Additional Information [id number]  Bethann Berkshire, LCSW

## 2022-01-23 NOTE — Progress Notes (Deleted)
Manufacturing engineer [facility]) Hospital Liaison Note  This is a current [facility] hospice patient. Please call with any questions or concerns. Thank you  Roselee Nova, [facility] Liaison [phone]

## 2022-01-23 NOTE — Progress Notes (Signed)
Attempted to call report x 2 [phone] to [facility] no answer. Discharge packet will be sent via [facility] services.  Ercell Perlman, Tivis Ringer, RN

## 2022-01-26 LAB — CULTURE, BLOOD (ROUTINE X 2)
Culture: NO GROWTH
Culture: NO GROWTH
Special Requests: ADEQUATE

## 2022-06-23 ENCOUNTER — Encounter (HOSPITAL_COMMUNITY): Payer: Self-pay

## 2022-06-23 ENCOUNTER — Emergency Department (HOSPITAL_COMMUNITY)
Admission: EM | Admit: 2022-06-23 | Discharge: 2022-06-24 | Disposition: A | Discharge status: Skilled Nursing Facility | Payer: Medicare Other | Attending: Emergency Medicine | Admitting: Emergency Medicine

## 2022-06-23 DIAGNOSIS — W19XXXA Unspecified fall, initial encounter: Secondary | ICD-10-CM | POA: Insufficient documentation

## 2022-06-23 DIAGNOSIS — F0390 Unspecified dementia without behavioral disturbance: Secondary | ICD-10-CM | POA: Diagnosis not present

## 2022-06-23 DIAGNOSIS — S0990XA Unspecified injury of head, initial encounter: Secondary | ICD-10-CM | POA: Diagnosis present

## 2022-06-23 DIAGNOSIS — S0101XA Laceration without foreign body of scalp, initial encounter: Secondary | ICD-10-CM

## 2022-06-23 MED ORDER — LIDOCAINE-EPINEPHRINE 2 %-1:100000 IJ SOLN
20.0000 mL | Freq: Once | INTRAMUSCULAR | Status: AC
  Administered 2022-06-23: 20 mL
  Filled 2022-06-23: qty 1

## 2022-06-23 NOTE — ED Provider Notes (Signed)
[place] EMERGENCY DEPARTMENT AT [facility] Provider Note   CSN: [id number] Arrival date & time: 06/23/22  2301     History  Chief Complaint  Patient presents with   Sarah Gutierrez is a 77 y.o. female.  Patient sent to the emergency department for evaluation of head injury.  Patient has a history of severe dementia.  She is brought to to the emergency department by EMS after nursing home staff found her in bed with a laceration on the back of her head.  She cannot provide any more information.       Home Medications Prior to Admission medications   Medication Sig Start Date End Date Taking? Authorizing Provider  acetaminophen (TYLENOL) 325 MG tablet Take 650 mg by mouth every 6 (six) hours as needed for mild pain or moderate pain.    [provider]  bisacodyl (DULCOLAX) 10 MG suppository Place 10 mg rectally daily as needed for moderate constipation.    [provider]  LORazepam (ATIVAN) 0.5 MG tablet Take 0.5 mg by mouth 2 (two) times daily as needed (agitation).    [provider]  mineral oil liquid Take 15 mLs by mouth at bedtime as needed for mild constipation.    [provider]  Multiple Vitamins-Minerals (ONE-A-DAY WOMENS 50+) TABS Take 1 tablet by mouth daily.    [provider]  NON FORMULARY Take 2.5 mLs by mouth daily. Tooth and gum tonic use 1/2 teaspoon as directed    [provider]  omeprazole (PRILOSEC) 40 MG capsule Take 40 mg by mouth daily.    [provider]  ondansetron (ZOFRAN) 4 MG tablet Take 4 mg by mouth every 6 (six) hours as needed for nausea or vomiting.    [provider]  pantoprazole (PROTONIX) 40 MG tablet Take 1 tablet (40 mg total) by mouth daily. 09/21/21   Truitt Merle, MD  polyethylene glycol [facility] Raveling) packet Take 17 g by mouth daily. 06/01/18   Dessa Phi, DO  pravastatin (PRAVACHOL) 10 MG tablet Take 10 mg by mouth every evening.     [provider]  QUEtiapine (SEROQUEL) 25 MG tablet Take 1 tablet (25 mg total) by mouth at bedtime. 01/23/22   Regalado, Belkys A, MD  senna-docusate (SENOKOT-S) 8.6-50 MG tablet Take 1 tablet by mouth daily. Patient taking differently: Take 2 tablets by mouth at bedtime. 06/01/18   Dessa Phi, DO  sertraline (ZOLOFT) 50 MG tablet Take 50 mg by mouth daily.    [provider]  thiamine 100 MG tablet Take 100 mg by mouth daily.    [provider]  traZODone (DESYREL) 50 MG tablet Take 1 tablet (50 mg total) by mouth 3 (three) times daily as needed (agitation). Patient taking differently: Take 50 mg by mouth at bedtime as needed for sleep (agitation). 08/04/19   Tegeler, Gwenyth Allegra, MD  triamcinolone cream (KENALOG) 0.1 % Apply 1 application topically 3 (three) times daily.    [provider]      Allergies    Oyster extract and Jeanie Cooks allergy]    Review of Systems   Review of Systems  Physical Exam Updated Vital Signs BP 138/63 (BP Location: Right Arm)   Pulse 69   Temp 98.4 F (36.9 C) (Oral)   Resp 17   Ht 5' 5"$  (1.651 m)   Wt 44.7 kg   SpO2 97%   BMI 16.40 kg/m  Physical Exam Vitals and nursing  note reviewed.  Constitutional:      General: She is not in acute distress.    Appearance: She is well-developed.  HENT:     Head: Normocephalic. Laceration (Left occipital scalp) present.     Mouth/Throat:     Mouth: Mucous membranes are moist.  Eyes:     General: Vision grossly intact. Gaze aligned appropriately.     Extraocular Movements: Extraocular movements intact.     Conjunctiva/sclera: Conjunctivae normal.  Cardiovascular:     Rate and Rhythm: Normal rate and regular rhythm.     Pulses: Normal pulses.     Heart sounds: Normal heart sounds, S1 normal and S2 normal. No murmur heard.    No friction rub. No gallop.  Pulmonary:     Effort: Pulmonary effort is normal. No respiratory distress.     Breath sounds: Normal  breath sounds.  Abdominal:     General: Bowel sounds are normal.     Palpations: Abdomen is soft.     Tenderness: There is no abdominal tenderness. There is no guarding or rebound.     Hernia: No hernia is present.  Musculoskeletal:        General: No swelling.     Cervical back: Full passive range of motion without pain, normal range of motion and neck supple. No spinous process tenderness or muscular tenderness. Normal range of motion.     Right lower leg: No edema.     Left lower leg: No edema.  Skin:    General: Skin is warm and dry.     Capillary Refill: Capillary refill takes less than 2 seconds.     Findings: No ecchymosis, erythema, rash or wound.  Neurological:     General: No focal deficit present.     Mental Status: She is alert. Mental status is at baseline.     GCS: GCS eye subscore is 4. GCS verbal subscore is 5. GCS motor subscore is 6.     Cranial Nerves: Cranial nerves 2-12 are intact.     Sensory: Sensation is intact.     Motor: Motor function is intact.  Psychiatric:        Attention and Perception: Attention normal.        Mood and Affect: Mood normal.        Speech: Speech normal.        Behavior: Behavior normal.     ED Results / Procedures / Treatments   Labs (all labs ordered are listed, but only abnormal results are displayed) Labs Reviewed - No data to display  EKG None  Radiology No results found.  Procedures .Marland KitchenLaceration Repair  Date/Time: 06/23/2022 11:40 PM  Performed by: Orpah Greek, MD Authorized by: Orpah Greek, MD   Consent:    Consent obtained:  Emergent situation Universal protocol:    Site/side marked: yes     Immediately prior to procedure, a time out was called: yes     Patient identity confirmed:  Hospital-assigned identification number Anesthesia:    Anesthesia method:  Local infiltration   Local anesthetic:  Lidocaine 2% WITH epi Laceration details:    Location:  Scalp   Scalp location:   Occipital   Length (cm):  3 Pre-procedure details:    Preparation:  Patient was prepped and draped in usual sterile fashion Exploration:    Hemostasis achieved with:  Epinephrine Treatment:    Area cleansed with:  Chlorhexidine   Amount of cleaning:  Standard   Irrigation solution:  Sterile saline  Irrigation method:  Syringe   Debridement:  None Skin repair:    Repair method:  Staples   Number of staples:  6 Approximation:    Approximation:  Close Repair type:    Repair type:  Simple Post-procedure details:    Dressing:  Open (no dressing)   Procedure completion:  Tolerated well, no immediate complications     Medications Ordered in ED Medications  lidocaine-EPINEPHrine (XYLOCAINE W/EPI) 2 %-1:100000 (with pres) injection 20 mL (20 mLs Infiltration Given 06/23/22 2321)    ED Course/ Medical Decision Making/ A&P                             Medical Decision Making Risk Prescription drug management.   Patient is awake and alert at arrival.  She is moving all 4 extremities without difficulty.  No sign of extremity injury.  Patient does have a laceration on her posterior occipital region.  Hospice has contacted the emergency department and informed us that the family did not wish to have any invasive care or scans.  Patient was sent to the ED for laceration repair only.  As patient is awake and alert and does not appear to have any significant injuries, laceration repaired and she will be returned to nursing home per family wishes.        Final Clinical Impression(s) / ED Diagnoses Final diagnoses:  Laceration of scalp, initial encounter    Rx / DC Orders ED Discharge Orders     None         Orpah Greek, MD 06/23/22 2341

## 2022-06-23 NOTE — ED Triage Notes (Signed)
Pt c/o laceration to back of head. Pt is from [facility] facility on [address]. Staff were making rounds tonight & found blood on patients pillow.

## 2022-06-23 NOTE — ED Notes (Signed)
[facility] called for transport.  

## 2022-06-23 NOTE — ED Notes (Addendum)
[facility] nurse called and states that family and patient do not wish for invasive medical treatment or scans. They would only like the laceration closed if needed. MD made aware. Hospice nurse contact number is [phone].

## 2023-05-13 IMAGING — MR MR HEAD W/O CM
13 series · 48 of 48 positions shown · non-contrast
Comparison: Prior head CT examinations 05/28/2020 and earlier.
Brain MRI 08/01/2019.

CLINICAL DATA: Provided history: Dysarthria. Additional history
provided by scanning technologist: Recent speech change, difficulty
walking, symptoms for 4 months.

EXAM:
MRI HEAD WITHOUT CONTRAST
TECHNIQUE: Multiplanar, multiecho pulse sequences of the brain and surrounding
structures were obtained without intravenous contrast.

[Series 5: T1 · sagittal · 4.0mm · 0.75mm/px · 1 of 31 slices shown (1 of 2)]
[im 1/31]
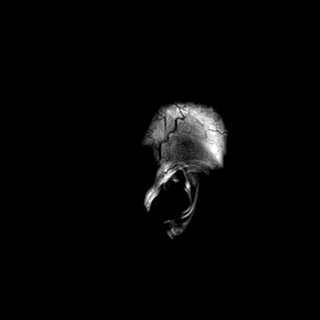

[Series 6: DWI · axial · 3.0mm · 0.94mm/px · z∈[-81,+77]mm · 9 of 179 slices shown (1 of 5)]
[im 1/179]
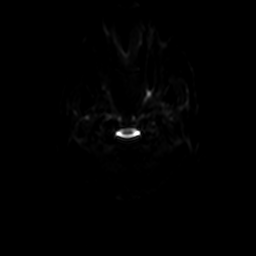
[im 23/179]
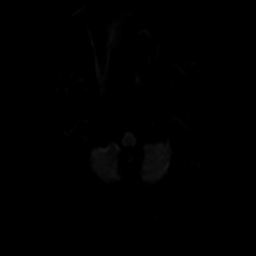
[im 45/179]
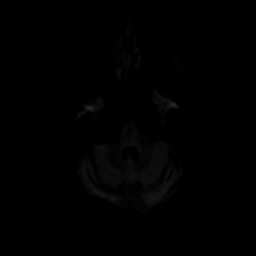
[im 67/179]
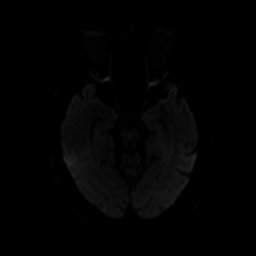
[im 90/179]
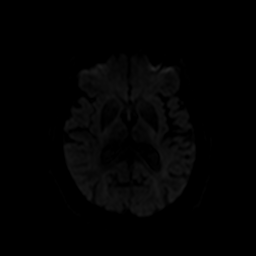
[im 112/179]
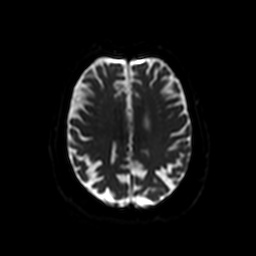
[im 134/179]
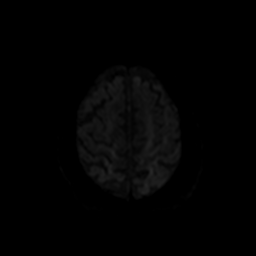
[im 156/179]
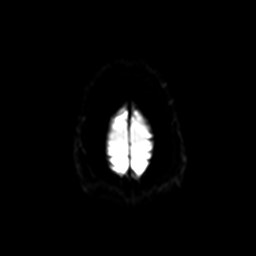
[im 179/179]
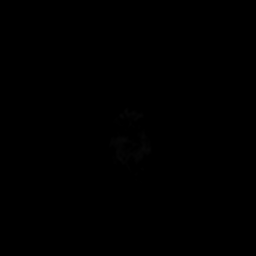

[Series 7: ax dwi_tracew · axial · 3.0mm · 0.94mm/px · z∈[-81,+77]mm · 5 of 89 slices shown]
[im 1/89]
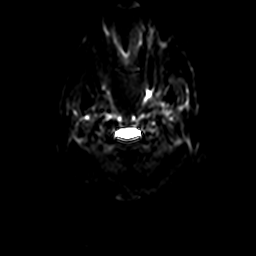
[im 23/89]
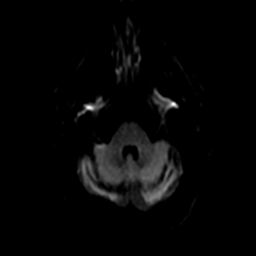
[im 45/89]
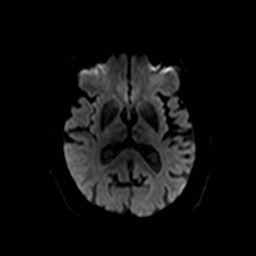
[im 67/89]
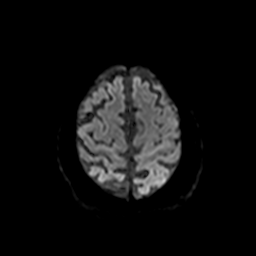
[im 89/89]
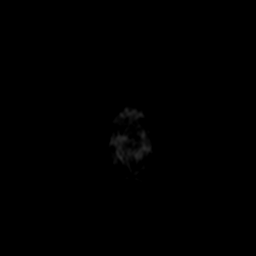

[Series 8: ax dwi_adc · axial · 3.0mm · 0.94mm/px · z∈[-81,+77]mm · 2 of 45 slices shown]
[im 1/45]
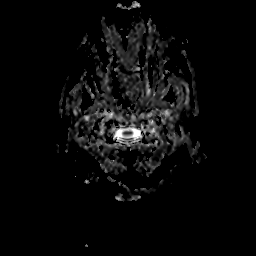
[im 45/45]
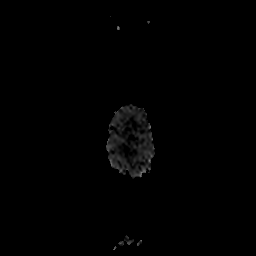

[Series 9: DWI · coronal · 5.0mm · 1.44mm/px · 4 of 68 slices shown (2 of 5)]
[im 1/68]
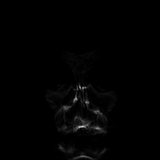
[im 23/68]
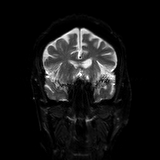
[im 45/68]
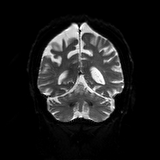
[im 68/68]
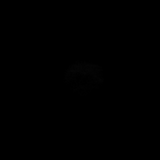

[Series 10: DWI · coronal · 5.0mm · 1.44mm/px · 2 of 34 slices shown (3 of 5)]
[im 1/34]
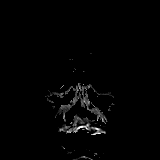
[im 34/34]
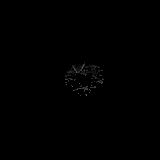

[Series 11: T2 · axial · 4.0mm · 0.36mm/px · z∈[-72,+84]mm · 2 of 31 slices shown (1 of 2)]
[im 1/31]
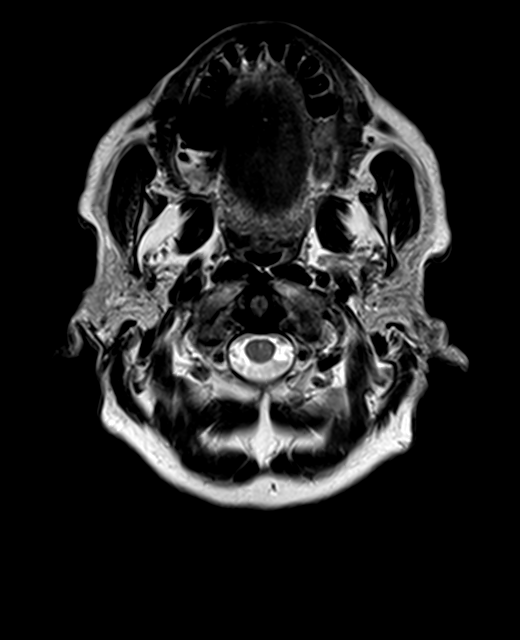
[im 31/31]
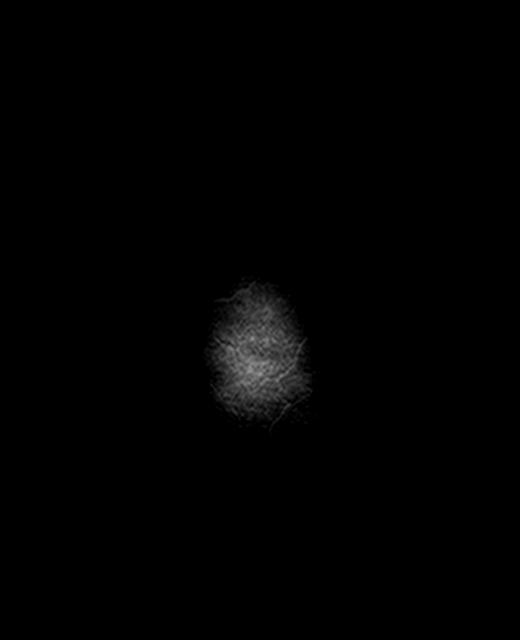

[Series 12: FLAIR · axial · 3.0mm · 0.72mm/px · 1 of 26 slices shown]
[im 1/26]
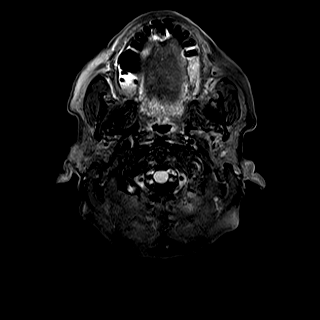

[Series 13: swi_images · axial · 1.5mm · 0.90mm/px · z∈[-65,+77]mm · 5 of 96 slices shown]
[im 1/96]
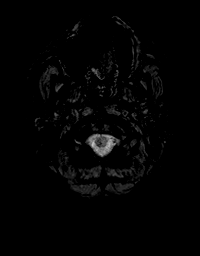
[im 24/96]
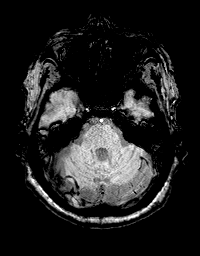
[im 48/96]
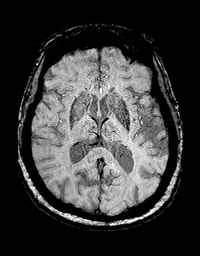
[im 72/96]
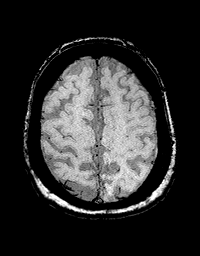
[im 96/96]
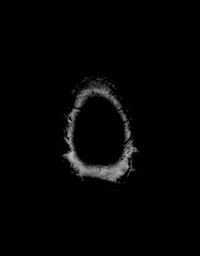

[Series 15: T1 · axial · 1.0mm · 0.94mm/px · z∈[-83,+75]mm · 8 of 160 slices shown (2 of 2)]
[im 1/160]
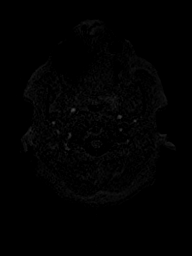
[im 23/160]
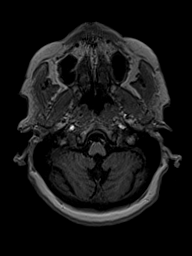
[im 46/160]
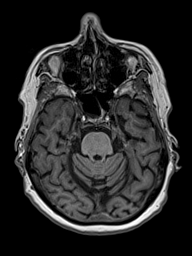
[im 69/160]
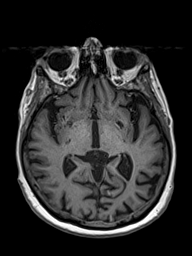
[im 91/160]
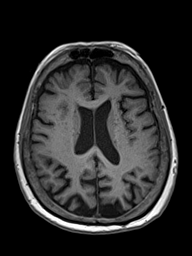
[im 114/160]
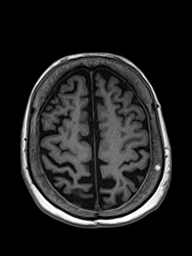
[im 137/160]
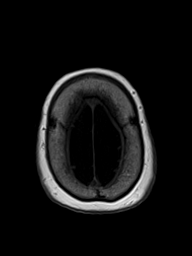
[im 160/160]
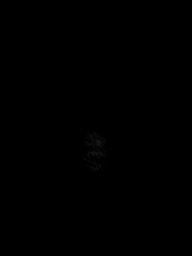

[Series 16: T2 · coronal · 4.5mm · 0.36mm/px · 2 of 32 slices shown (2 of 2)]
[im 1/32]
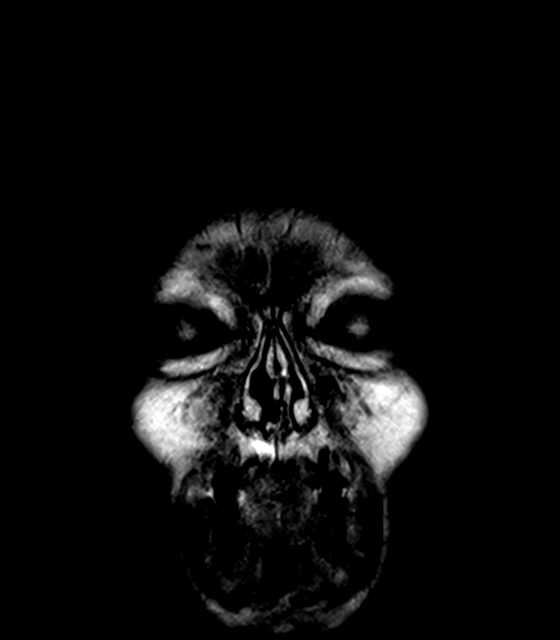
[im 32/32]
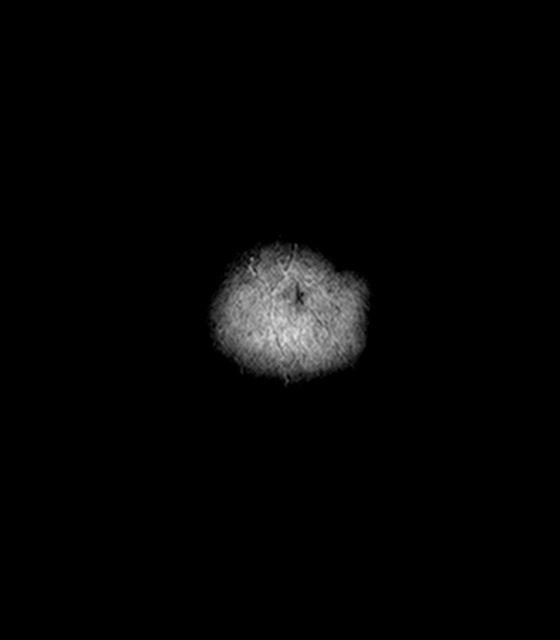

[Series 19: DWI · axial · 3.0mm · 1.44mm/px · z∈[-67,+81]mm · 5 of 92 slices shown (4 of 5)]
[im 1/92]
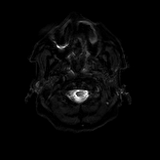
[im 23/92]
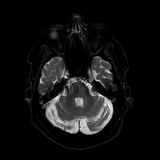
[im 46/92]
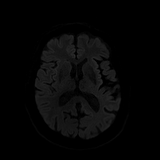
[im 69/92]
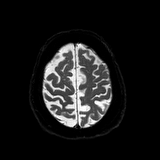
[im 92/92]
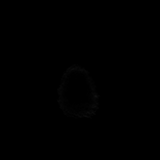

[Series 20: DWI · axial · 3.0mm · 1.44mm/px · z∈[-67,+81]mm · 2 of 46 slices shown (5 of 5)]
[im 1/46]
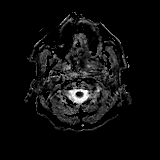
[im 46/46]
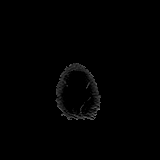

[48 of 48 positions shown; findings below may reference images not displayed]

FINDINGS: Brain:

Moderate cerebral and cerebellar atrophy. Cerebral atrophy has no
appreciable lobar predominance.

Minimal multifocal T2/FLAIR hyperintensity within the cerebral white
matter, nonspecific but likely reflecting chronic small vessel
ischemic disease.

There is no acute infarct.

No evidence of intracranial mass.

No chronic intracranial blood products.

No extra-axial fluid collection.

No midline shift.

Vascular: Expected proximal arterial flow voids.

Skull and upper cervical spine: No focal suspicious marrow lesion.
Incompletely assessed cervical spondylosis.

Sinuses/Orbits: Visualized orbits show no acute finding. Trace
bilateral ethmoid sinus mucosal thickening. Mild mucosal thickening
and possible small fluid level within the right sphenoid sinus.

Other: Small right mastoid effusion.
IMPRESSION: No evidence of acute intracranial abnormality.

Stable non-contrast MRI appearance of the brain as compared to
08/01/2019.

Moderate generalized cerebral and cerebellar atrophy.

Minimal chronic small vessel ischemic changes within the cerebral
white matter.

Right sphenoid sinusitis, as described.

Small right mastoid effusion.

## 2023-07-24 ENCOUNTER — Other Ambulatory Visit: Payer: Self-pay

## 2023-07-24 ENCOUNTER — Emergency Department (HOSPITAL_COMMUNITY)

## 2023-07-24 ENCOUNTER — Emergency Department (HOSPITAL_COMMUNITY)
Admission: EM | Admit: 2023-07-24 | Discharge: 2023-07-24 | Disposition: A | Discharge status: Skilled Nursing Facility | Attending: Emergency Medicine | Admitting: Emergency Medicine

## 2023-07-24 DIAGNOSIS — S0101XA Laceration without foreign body of scalp, initial encounter: Secondary | ICD-10-CM | POA: Insufficient documentation

## 2023-07-24 DIAGNOSIS — F0390 Unspecified dementia without behavioral disturbance: Secondary | ICD-10-CM | POA: Diagnosis not present

## 2023-07-24 DIAGNOSIS — Z23 Encounter for immunization: Secondary | ICD-10-CM | POA: Insufficient documentation

## 2023-07-24 DIAGNOSIS — S0990XA Unspecified injury of head, initial encounter: Secondary | ICD-10-CM | POA: Diagnosis present

## 2023-07-24 DIAGNOSIS — Z8501 Personal history of malignant neoplasm of esophagus: Secondary | ICD-10-CM | POA: Diagnosis not present

## 2023-07-24 DIAGNOSIS — W19XXXA Unspecified fall, initial encounter: Secondary | ICD-10-CM | POA: Insufficient documentation

## 2023-07-24 DIAGNOSIS — I10 Essential (primary) hypertension: Secondary | ICD-10-CM | POA: Insufficient documentation

## 2023-07-24 MED ORDER — TETANUS-DIPHTH-ACELL PERTUSSIS 5-2.5-18.5 LF-MCG/0.5 IM SUSY
0.5000 mL | PREFILLED_SYRINGE | Freq: Once | INTRAMUSCULAR | Status: AC
Start: 2023-07-24 — End: 2023-07-24
  Administered 2023-07-24: 0.5 mL via INTRAMUSCULAR
  Filled 2023-07-24: qty 0.5

## 2023-07-24 MED ORDER — ACETAMINOPHEN 325 MG PO TABS
650.0000 mg | ORAL_TABLET | Freq: Once | ORAL | Status: AC
  Administered 2023-07-24: 650 mg via ORAL
  Filled 2023-07-24: qty 2

## 2023-07-24 MED ORDER — LIDOCAINE-EPINEPHRINE-TETRACAINE (LET) TOPICAL GEL
3.0000 mL | Freq: Once | TOPICAL | Status: AC
  Administered 2023-07-24: 3 mL via TOPICAL
  Filled 2023-07-24: qty 3

## 2023-07-24 MED ORDER — LIDOCAINE-EPINEPHRINE-TETRACAINE (LET) TOPICAL GEL
3.0000 mL | Freq: Once | TOPICAL | Status: DC
  Filled 2023-07-24: qty 3

## 2023-07-24 NOTE — ED Notes (Signed)
[facility] here to transport pt back to facility 

## 2023-07-24 NOTE — ED Triage Notes (Signed)
 Pt was found on the floor by facility staff. Pt has a laceration to the back of the head. EMS was able to control bleeding prior to arrival. Unknown if pt lost consciousness. Pt has history of severe dementia. Pt pleasantly confused.

## 2023-07-24 NOTE — Discharge Instructions (Signed)
 Imaging studies did not show any major injuries.  You received 8 staples in the cut on your scalp.  These will need to be removed in 1 week.  Return to the emergency department for any new or worsening symptoms of concern.

## 2023-07-24 NOTE — ED Provider Notes (Signed)
 [place] EMERGENCY DEPARTMENT AT [facility] Provider Note   CSN: [id number] Arrival date & time: 07/24/23  7846     History  Chief Complaint  Patient presents with   Fall   Laceration    Back of head    Sarah Gutierrez is a 78 y.o. female.  HPI Patient presents after fall.  Medical history includes HTN, cognitive impairment, esophageal cancer.  She arrives from [facility] living.  She is unable to provide any history but denies any areas of pain.  She reportedly had a fall.    Home Medications Prior to Admission medications   Medication Sig Start Date End Date Taking? Authorizing Provider  acetaminophen (TYLENOL) 325 MG tablet Take 650 mg by mouth every 6 (six) hours as needed for mild pain or moderate pain.    [provider]  bisacodyl (DULCOLAX) 10 MG suppository Place 10 mg rectally daily as needed for moderate constipation.    [provider]  LORazepam (ATIVAN) 0.5 MG tablet Take 0.5 mg by mouth 2 (two) times daily as needed (agitation).    [provider]  mineral oil liquid Take 15 mLs by mouth at bedtime as needed for mild constipation.    [provider]  Multiple Vitamins-Minerals (ONE-A-DAY WOMENS 50+) TABS Take 1 tablet by mouth daily.    [provider]  NON FORMULARY Take 2.5 mLs by mouth daily. Tooth and gum tonic use 1/2 teaspoon as directed    [provider]  omeprazole (PRILOSEC) 40 MG capsule Take 40 mg by mouth daily.    [provider]  ondansetron (ZOFRAN) 4 MG tablet Take 4 mg by mouth every 6 (six) hours as needed for nausea or vomiting.    [provider]  pantoprazole (PROTONIX) 40 MG tablet Take 1 tablet (40 mg total) by mouth daily. 09/21/21   Malachy Mood, MD  polyethylene glycol [facility] Ethelene Hal) packet Take 17 g by mouth daily. 06/01/18   Noralee Stain, DO  pravastatin (PRAVACHOL) 10 MG tablet Take 10 mg by mouth every evening.    [provider]   QUEtiapine (SEROQUEL) 25 MG tablet Take 1 tablet (25 mg total) by mouth at bedtime. 01/23/22   Regalado, Belkys A, MD  senna-docusate (SENOKOT-S) 8.6-50 MG tablet Take 1 tablet by mouth daily. Patient taking differently: Take 2 tablets by mouth at bedtime. 06/01/18   Noralee Stain, DO  sertraline (ZOLOFT) 50 MG tablet Take 50 mg by mouth daily.    [provider]  thiamine 100 MG tablet Take 100 mg by mouth daily.    [provider]  traZODone (DESYREL) 50 MG tablet Take 1 tablet (50 mg total) by mouth 3 (three) times daily as needed (agitation). Patient taking differently: Take 50 mg by mouth at bedtime as needed for sleep (agitation). 08/04/19   Tegeler, Canary Brim, MD  triamcinolone cream (KENALOG) 0.1 % Apply 1 application topically 3 (three) times daily.    [provider]      Allergies    Oyster extract and Laqueta Jean allergy]    Review of Systems   Review of Systems  Unable to perform ROS: Dementia    Physical Exam Updated Vital Signs BP (!) 174/59 (BP Location: Left Arm)   Pulse 74   Temp 98.4 F (36.9 C) (Oral)   Resp (!) 22   Ht 5\' 2"  (1.575 m)   Wt 44.8 kg   SpO2 96%   BMI 18.07 kg/m  Physical Exam  Vitals and nursing note reviewed.  Constitutional:      General: She is not in acute distress.    Appearance: Normal appearance. She is well-developed. She is not ill-appearing, toxic-appearing or diaphoretic.  HENT:     Head: Normocephalic.     Comments: Hematoma with hemostatic 3 cm laceration to posterior scalp.    Right Ear: External ear normal.     Left Ear: External ear normal.     Nose: Nose normal.     Mouth/Throat:     Mouth: Mucous membranes are moist.  Eyes:     Extraocular Movements: Extraocular movements intact.     Conjunctiva/sclera: Conjunctivae normal.  Cardiovascular:     Rate and Rhythm: Normal rate and regular rhythm.  Pulmonary:     Effort: Pulmonary effort is normal. No respiratory distress.  Chest:      Chest wall: No tenderness.  Abdominal:     General: There is no distension.     Palpations: Abdomen is soft.     Tenderness: There is no abdominal tenderness.  Musculoskeletal:        General: No swelling, tenderness or deformity.     Cervical back: Normal range of motion and neck supple.  Skin:    General: Skin is warm and dry.     Coloration: Skin is not jaundiced or pale.  Neurological:     General: No focal deficit present.     Mental Status: She is alert. She is disoriented.  Psychiatric:        Mood and Affect: Mood normal.        Behavior: Behavior normal.    ED Results / Procedures / Treatments   Labs (all labs ordered are listed, but only abnormal results are displayed) Labs Reviewed - No data to display  EKG EKG Interpretation Date/Time:  Tuesday July 24 2023 07:33:52 EDT Ventricular Rate:  74 PR Interval:  141 QRS Duration:  70 QT Interval:  372 QTC Calculation: 413 R Axis:   72  Text Interpretation: Sinus rhythm Confirmed by Gloris Manchester ([id number]) on 07/24/2023 8:57:05 AM  Radiology CT HEAD WO CONTRAST Result Date: 07/24/2023 CLINICAL DATA:  Fall with posterior head laceration EXAM: CT HEAD WITHOUT CONTRAST CT CERVICAL SPINE WITHOUT CONTRAST TECHNIQUE: Multidetector CT imaging of the head and cervical spine was performed following the standard protocol without intravenous contrast. Multiplanar CT image reconstructions of the cervical spine were also generated. RADIATION DOSE REDUCTION: This exam was performed according to the departmental dose-optimization program which includes automated exposure control, adjustment of the mA and/or kV according to patient size and/or use of iterative reconstruction technique. COMPARISON:  07/12/2021 FINDINGS: CT HEAD FINDINGS Brain: No evidence of acute infarction, hemorrhage, hydrocephalus, extra-axial collection or mass lesion/mass effect. Diffuse brain atrophy especially affecting the cerebellum Vascular: No hyperdense vessel or  unexpected calcification. Skull: No acute fracture Sinuses/Orbits: Generalized opacification of paranasal sinuses with fluid levels. No evidence of orbit injury. CT CERVICAL SPINE FINDINGS Alignment: No traumatic malalignment. Degenerative straightening of cervical lordosis. Skull base and vertebrae: No acute fracture. No primary bone lesion or focal pathologic process. Soft tissues and spinal canal: No prevertebral fluid or swelling. No visible canal hematoma. Disc levels: Generalized degenerative disc narrowing and endplate spurring. Upper chest: No evidence of injury IMPRESSION: No evidence of acute intracranial or cervical spine injury. Electronically Signed   By: Tiburcio Pea M.D.   On: 07/24/2023 08:57   CT CERVICAL SPINE WO CONTRAST Result Date: 07/24/2023 CLINICAL DATA:  Fall with posterior  head laceration EXAM: CT HEAD WITHOUT CONTRAST CT CERVICAL SPINE WITHOUT CONTRAST TECHNIQUE: Multidetector CT imaging of the head and cervical spine was performed following the standard protocol without intravenous contrast. Multiplanar CT image reconstructions of the cervical spine were also generated. RADIATION DOSE REDUCTION: This exam was performed according to the departmental dose-optimization program which includes automated exposure control, adjustment of the mA and/or kV according to patient size and/or use of iterative reconstruction technique. COMPARISON:  07/12/2021 FINDINGS: CT HEAD FINDINGS Brain: No evidence of acute infarction, hemorrhage, hydrocephalus, extra-axial collection or mass lesion/mass effect. Diffuse brain atrophy especially affecting the cerebellum Vascular: No hyperdense vessel or unexpected calcification. Skull: No acute fracture Sinuses/Orbits: Generalized opacification of paranasal sinuses with fluid levels. No evidence of orbit injury. CT CERVICAL SPINE FINDINGS Alignment: No traumatic malalignment. Degenerative straightening of cervical lordosis. Skull base and vertebrae: No acute  fracture. No primary bone lesion or focal pathologic process. Soft tissues and spinal canal: No prevertebral fluid or swelling. No visible canal hematoma. Disc levels: Generalized degenerative disc narrowing and endplate spurring. Upper chest: No evidence of injury IMPRESSION: No evidence of acute intracranial or cervical spine injury. Electronically Signed   By: Tiburcio Pea M.D.   On: 07/24/2023 08:57    Procedures .Laceration Repair  Date/Time: 07/24/2023 9:34 AM  Performed by: Gloris Manchester, MD Authorized by: Gloris Manchester, MD   Laceration details:    Length (cm):  3   Depth (mm):  3 Pre-procedure details:    Preparation:  Imaging obtained to evaluate for foreign bodies Treatment:    Area cleansed with:  Saline   Amount of cleaning:  Standard   Irrigation solution:  Sterile saline Skin repair:    Repair method:  Staples   Number of staples:  8 Approximation:    Approximation:  Close Repair type:    Repair type:  Simple Post-procedure details:    Dressing:  Open (no dressing)   Procedure completion:  Tolerated     Medications Ordered in ED Medications  lidocaine-EPINEPHrine-tetracaine (LET) topical gel (has no administration in time range)  Tdap (BOOSTRIX) injection 0.5 mL (0.5 mLs Intramuscular Given 07/24/23 0821)  acetaminophen (TYLENOL) tablet 650 mg (650 mg Oral Given 07/24/23 0823)  lidocaine-EPINEPHrine-tetracaine (LET) topical gel (3 mLs Topical Given 07/24/23 2841)    ED Course/ Medical Decision Making/ A&P                                 Medical Decision Making Amount and/or Complexity of Data Reviewed Radiology: ordered.  Risk OTC drugs. Prescription drug management.   This patient presents to the ED for concern of fall, this involves an extensive number of treatment options, and is a complaint that carries with it a high risk of complications and morbidity.  The differential diagnosis includes acute injuries   Co morbidities that complicate the  patient evaluation  HTN, cognitive impairment, esophageal cancer   Additional history obtained:  Additional history obtained from N/A External records from outside source obtained and reviewed including EMR  Imaging Studies ordered:  I ordered imaging studies including x-ray of chest and pelvis, CT of head and cervical spine I independently visualized and interpreted imaging which showed no acute findings I agree with the radiologist interpretation   Cardiac Monitoring: / EKG:  The patient was maintained on a cardiac monitor.  I personally viewed and interpreted the cardiac monitored which showed an underlying rhythm of: Sinus rhythm  Problem  List / ED Course / Critical interventions / Medication management  Patient presenting after reported fall.  There is no one present on arrival to provide information.  Attempts at calling her senior living facility went to voicemail.  Patient is unable to provide any history herself.  Per chart review, this is her baseline.  On exam, she does have a hematoma with a hemostatic laceration to the left posterior scalp.  She arrives in a c-collar.  She has no areas of chest or abdominal tenderness.  She has good range of motion in all extremities without evidence of deformity or tenderness.  At this time, she is pleasant and cooperative.  Imaging studies were ordered.  Per chart review, last tetanus was updated in 2010.  Will update today.  Tylenol ordered for analgesia.  Let gel ordered.  Laceration was repaired with 8 staples.  Imaging studies did not show acute findings.  I attempted to call her brother/power of attorney.  Call went to voicemail.  Message was left for him to call back.  No callback was received.  Patient was discharged in stable condition. I ordered medication including Tylenol for analgesia; let gel for local anesthesia; Tdap for tetanus prophylaxis Reevaluation of the patient after these medicines showed that the patient improved I  have reviewed the patients home medicines and have made adjustments as needed   Social Determinants of Health:  Has dementia and resides in nursing facility         Final Clinical Impression(s) / ED Diagnoses Final diagnoses:  Fall, initial encounter  Laceration of scalp, initial encounter    Rx / DC Orders ED Discharge Orders     None         Gloris Manchester, MD 07/24/23 1130

## 2023-07-24 NOTE — ED Notes (Signed)
 Pt was caught getting up an walking around, tried assisting pt in bed, pt was not happy with that. Writer and pt agreered pt can sit in chair on bedside if she doesn't try to get up. pt has been sitting on bedside watching tv.

## 2024-01-03 ENCOUNTER — Emergency Department (HOSPITAL_COMMUNITY)
Admission: EM | Admit: 2024-01-03 | Discharge: 2024-01-03 | Disposition: A | Discharge status: Home / Self Care | Attending: Emergency Medicine | Admitting: Emergency Medicine

## 2024-01-03 ENCOUNTER — Other Ambulatory Visit: Payer: Self-pay

## 2024-01-03 ENCOUNTER — Emergency Department (HOSPITAL_COMMUNITY)

## 2024-01-03 DIAGNOSIS — W228XXA Striking against or struck by other objects, initial encounter: Secondary | ICD-10-CM | POA: Insufficient documentation

## 2024-01-03 DIAGNOSIS — I10 Essential (primary) hypertension: Secondary | ICD-10-CM | POA: Diagnosis not present

## 2024-01-03 DIAGNOSIS — S0990XA Unspecified injury of head, initial encounter: Secondary | ICD-10-CM | POA: Diagnosis present

## 2024-01-03 DIAGNOSIS — Y92129 Unspecified place in nursing home as the place of occurrence of the external cause: Secondary | ICD-10-CM | POA: Diagnosis not present

## 2024-01-03 DIAGNOSIS — S0101XA Laceration without foreign body of scalp, initial encounter: Secondary | ICD-10-CM | POA: Diagnosis not present

## 2024-01-03 MED ORDER — LIDOCAINE-EPINEPHRINE (PF) 2 %-1:200000 IJ SOLN
10.0000 mL | Freq: Once | INTRAMUSCULAR | Status: AC
  Administered 2024-01-03: 10 mL
  Filled 2024-01-03: qty 20

## 2024-01-03 NOTE — Discharge Instructions (Signed)
 2 staples places in the scalp, will need to be removed in 7 to 10 days.  Monitor for redness, drainage, warmth or increasing pain.  Imaging of the head and cervical spine without any acute injury.

## 2024-01-03 NOTE — ED Provider Notes (Signed)
 [place] EMERGENCY DEPARTMENT AT [facility] Provider Note   CSN: [id number] Arrival date & time: 01/03/24  9371     Patient presents with: Laceration   Sarah Gutierrez is a 78 y.o. female.   Sarah Gutierrez is a 78 y.o. female with a history of cognitive impairment, hypertension, who presents to the emergency department via EMS from [facility] memory care facility.  Per facility staff they were going to move patient in the bed to get her up for her ADLs earlier this morning and she hit her head on the side table causing a laceration to the back of the head.  They deny any falls or other injury.  Patient is not on any blood thinners.  At baseline patient babbles but cannot answer questions.  They report that aside from this cut she has been acting like her usual self and has not had any other issues at the facility recently.  Of note she has been seen for falls and similar injuries multiple times.  Patient unable to answer questions or contribute to history.    The history is provided by the patient, the EMS personnel, the nursing home and medical records.       Prior to Admission medications   Medication Sig Start Date End Date Taking? Authorizing Provider  acetaminophen  (TYLENOL ) 325 MG tablet Take 650 mg by mouth every 6 (six) hours as needed for mild pain or moderate pain.    [provider]  bisacodyl  (DULCOLAX) 10 MG suppository Place 10 mg rectally daily as needed for moderate constipation.    [provider]  LORazepam  (ATIVAN ) 0.5 MG tablet Take 0.5 mg by mouth 2 (two) times daily as needed (agitation).    [provider]  mineral oil liquid Take 15 mLs by mouth at bedtime as needed for mild constipation.    [provider]  Multiple Vitamins-Minerals (ONE-A-DAY WOMENS 50+) TABS Take 1 tablet by mouth daily.    [provider]  NON FORMULARY Take 2.5 mLs by mouth daily. Tooth and gum tonic use 1/2 teaspoon as directed     [provider]  omeprazole (PRILOSEC) 40 MG capsule Take 40 mg by mouth daily.    [provider]  ondansetron  (ZOFRAN ) 4 MG tablet Take 4 mg by mouth every 6 (six) hours as needed for nausea or vomiting.    [provider]  pantoprazole  (PROTONIX ) 40 MG tablet Take 1 tablet (40 mg total) by mouth daily. 09/21/21   Lanny Callander, MD  polyethylene glycol (MIRALAX  / GLYCOLAX ) packet Take 17 g by mouth daily. 06/01/18   Rojelio Nest, DO  pravastatin  (PRAVACHOL ) 10 MG tablet Take 10 mg by mouth every evening.    [provider]  QUEtiapine  (SEROQUEL ) 25 MG tablet Take 1 tablet (25 mg total) by mouth at bedtime. 01/23/22   Regalado, Belkys A, MD  senna-docusate (SENOKOT-S) 8.6-50 MG tablet Take 1 tablet by mouth daily. Patient taking differently: Take 2 tablets by mouth at bedtime. 06/01/18   Rojelio Nest, DO  sertraline  (ZOLOFT ) 50 MG tablet Take 50 mg by mouth daily.    [provider]  thiamine  100 MG tablet Take 100 mg by mouth daily.    [provider]  traZODone  (DESYREL ) 50 MG tablet Take 1 tablet (50 mg total) by mouth 3 (three) times daily as needed (agitation). Patient taking differently: Take 50 mg by mouth at bedtime as needed for sleep (agitation). 08/04/19   Tegeler, Lonni PARAS,  MD  triamcinolone cream (KENALOG) 0.1 % Apply 1 application topically 3 (three) times daily.    [provider]    Allergies: Oyster extract and Merlinda cerise allergy]    Review of Systems  Unable to perform ROS: Dementia    Updated Vital Signs BP (!) 127/47 (BP Location: Right Arm)   Pulse (!) 45   Temp 97.9 F (36.6 C) (Oral)   Resp 14   SpO2 99%   Physical Exam Constitutional:      Appearance: She is normal weight. She is not diaphoretic.     Comments: Chronically ill-appearing elderly woman, alert, opens eyes to voice and is able to say yes but cannot meaningfully answer questions  HENT:     Head:     Comments: 3 cm  laceration to the back of the scalp without bleeding, no surrounding hematoma, step-off or deformity, no bony tenderness over the head elsewhere, negative Battle sign. Eyes:     Extraocular Movements: Extraocular movements intact.     Conjunctiva/sclera: Conjunctivae normal.     Pupils: Pupils are equal, round, and reactive to light.  Neck:     Comments: No midline tenderness or step-off Cardiovascular:     Rate and Rhythm: Normal rate and regular rhythm.     Heart sounds: Normal heart sounds.  Pulmonary:     Effort: Pulmonary effort is normal. No respiratory distress.     Breath sounds: Normal breath sounds.     Comments: No chest wall tenderness Abdominal:     General: Abdomen is flat. Bowel sounds are normal.     Palpations: Abdomen is soft.     Tenderness: There is no abdominal tenderness.  Musculoskeletal:     Cervical back: Neck supple. No tenderness.     Comments: Moving all extremities without difficulty, all joints supple and easily movable and all compartments soft  Skin:    General: Skin is warm and dry.  Neurological:     Mental Status: She is alert.     Comments: At baseline mental status, opens eyes to verbal stimuli and often says yes but is not able to follow commands or answer questions.     (all labs ordered are listed, but only abnormal results are displayed) Labs Reviewed - No data to display  EKG: None  Radiology: CT Cervical Spine Wo Contrast Result Date: 01/03/2024 EXAM: CT CERVICAL SPINE WITHOUT CONTRAST 01/03/2024 06:57:30 AM TECHNIQUE: CT of the cervical spine was performed without the administration of intravenous contrast. Multiplanar reformatted images are provided for review. Automated exposure control, iterative reconstruction, and/or weight based adjustment of the mA/kV was utilized to reduce the radiation dose to as low as reasonably achievable. COMPARISON: CT of the cervical spine dated 07/24/2023. CLINICAL HISTORY: Neck trauma (Age >= 65y).  Fall, head injury. FINDINGS: CERVICAL SPINE: BONES AND ALIGNMENT: Mild straightening of the normal cervical lordosis. Slight degenerative anterolisthesis at C2-3. DEGENERATIVE CHANGES: Chronic degenerative disc disease with mild posterior bulging at C3-4, C4-5, C5-6 and C6-7. SOFT TISSUES: No prevertebral soft tissue swelling. LUNGS: The lung apices are mildly emphysematous. IMPRESSION: 1. No acute abnormality of the cervical spine related to the reported neck trauma. 2. Slight degenerative anterolisthesis at C2-3. 3. Chronic degenerative disc disease with mild posterior bulging at C3-4, C4-5, C5-6, and C6-7. Electronically signed by: Evalene Coho MD 01/03/2024 07:12 AM EDT RP Workstation: [id number]   CT Head Wo Contrast Result Date: 01/03/2024 EXAM: CT HEAD WITHOUT CONTRAST 01/03/2024 06:57:30 AM TECHNIQUE: CT of the head was performed  without the administration of intravenous contrast. Automated exposure control, iterative reconstruction, and/or weight based adjustment of the mA/kV was utilized to reduce the radiation dose to as low as reasonably achievable. COMPARISON: CT of the head dated 07/24/2023. CLINICAL HISTORY: Head trauma, minor (Age >= 65y). Fall, head injury. FINDINGS: BRAIN AND VENTRICLES: No acute hemorrhage. Gray-white differentiation is preserved. No hydrocephalus. No extra-axial collection. No mass effect or midline shift. Moderate generalized volume loss. Mild cerebral white matter disease. ORBITS: No acute abnormality. SINUSES: Moderate opacification of the right sphenoid sinus. SOFT TISSUES AND SKULL: No acute soft tissue abnormality. No skull fracture. IMPRESSION: 1. No acute intracranial abnormality. 2. Moderate generalized volume loss and mild cerebral white matter disease. 3. Moderate opacification of the right sphenoid sinus. Electronically signed by: Evalene Coho MD 01/03/2024 07:10 AM EDT RP Workstation: [id number]     .Laceration Repair  Date/Time: 01/03/2024 7:30  AM  Performed by: Alva Larraine FALCON, PA-C Authorized by: Alva Larraine FALCON, PA-C   Consent:    Consent obtained:  Verbal   Consent given by:  Patient   Risks, benefits, and alternatives were discussed: yes     Risks discussed:  Infection, need for additional repair, pain and poor cosmetic result Universal protocol:    Procedure explained and questions answered to patient or proxy's satisfaction: yes     Patient identity confirmed:  Arm band Anesthesia:    Anesthesia method:  Local infiltration   Local anesthetic:  Lidocaine  2% WITH epi Laceration details:    Location:  Scalp   Scalp location:  Occipital   Length (cm):  3 Pre-procedure details:    Preparation:  Imaging obtained to evaluate for foreign bodies Exploration:    Hemostasis achieved with:  Direct pressure and epinephrine    Imaging outcome: foreign body not noted     Wound exploration: entire depth of wound visualized     Wound extent: areolar tissue violated     Wound extent: no underlying fracture   Treatment:    Area cleansed with:  Chlorhexidine   Amount of cleaning:  Standard Skin repair:    Repair method:  Staples   Number of staples:  2 Approximation:    Approximation:  Close Repair type:    Repair type:  Simple Post-procedure details:    Dressing:  Open (no dressing)   Procedure completion:  Tolerated well, no immediate complications    Medications Ordered in the ED  lidocaine -EPINEPHrine  (XYLOCAINE  W/EPI) 2 %-1:200000 (PF) injection 10 mL (10 mLs Infiltration Given 01/03/24 0718)                                    Medical Decision Making Amount and/or Complexity of Data Reviewed Radiology: ordered.   78 year old female presents with laceration to the back of the scalp after striking her head on furniture while facility staff are helping her get up this morning.  No active bleeding or hematoma.  CT scans of the head and cervical spine obtained, imaging viewed and interpreted independently, no acute  intracranial abnormality or skull fracture noted, some degenerative changes within the cervical spine but no acute fracture or malalignment.  Scalp laceration repaired with staples, staples need to be removed in 7 to 10 days.  Patient tolerated with some difficulty given underlying dementia.  I called and discussed patient's care with her guardian Dr. [employer].  At this time patient is appropriate for discharge back to [facility]  facility.  Nursing staff will arrange [facility] transport.     Final diagnoses:  Laceration of scalp, initial encounter    ED Discharge Orders     None          Alva Larraine JULIANNA DEVONNA 01/03/24 0745    Ula Prentice SAUNDERS, MD 01/03/24 [phone]

## 2024-01-03 NOTE — ED Triage Notes (Signed)
 Pt BIBA [facility] from [facility] for a lac to the back of her head. Pt is not on any thinners. Pt states she did not fall. Pt's nurse at the facility stated the pt was moving in the bed and hit her head on the side of the table. Pt's vitals WNL with EMS.
